# Patient Record
Sex: Male | Born: 1955
Health system: Southern US, Community
[De-identification: ages and names within clinical notes are randomized; demographics above are authoritative.]

## PROBLEM LIST (undated history)

## (undated) DIAGNOSIS — E785 Hyperlipidemia, unspecified: Secondary | ICD-10-CM

## (undated) DIAGNOSIS — I10 Essential (primary) hypertension: Secondary | ICD-10-CM

## (undated) DIAGNOSIS — R972 Elevated prostate specific antigen [PSA]: Secondary | ICD-10-CM

## (undated) DIAGNOSIS — G8110 Spastic hemiplegia affecting unspecified side: Secondary | ICD-10-CM

## (undated) DIAGNOSIS — E7800 Pure hypercholesterolemia, unspecified: Secondary | ICD-10-CM

## (undated) DIAGNOSIS — R7301 Impaired fasting glucose: Secondary | ICD-10-CM

## (undated) DIAGNOSIS — G8194 Hemiplegia, unspecified affecting left nondominant side: Secondary | ICD-10-CM

## (undated) DIAGNOSIS — I639 Cerebral infarction, unspecified: Secondary | ICD-10-CM

## (undated) HISTORY — DX: Pure hypercholesterolemia, unspecified: E78.00

## (undated) HISTORY — DX: Hyperlipidemia, unspecified: E78.5

## (undated) HISTORY — DX: Essential (primary) hypertension: I10

## (undated) HISTORY — DX: Hemiplegia, unspecified affecting left nondominant side: G81.94

## (undated) HISTORY — DX: Impaired fasting glucose: R73.01

## (undated) HISTORY — DX: Spastic hemiplegia affecting unspecified side: G81.10

## (undated) HISTORY — DX: Elevated prostate specific antigen (PSA): R97.20

## (undated) HISTORY — DX: Cerebral infarction, unspecified: I63.9

---

## 2003-01-04 HISTORY — PX: HERNIA REPAIR: SHX51

## 2004-01-04 HISTORY — PX: CATARACT EXTRACTION: SUR2

## 2006-04-18 ENCOUNTER — Emergency Department (HOSPITAL_COMMUNITY): Admission: EM | Admit: 2006-04-18 | Discharge: 2006-04-18 | Payer: Self-pay | Admitting: Emergency Medicine

## 2006-05-02 ENCOUNTER — Ambulatory Visit: Payer: Self-pay | Admitting: Cardiology

## 2006-05-17 ENCOUNTER — Ambulatory Visit: Payer: Self-pay | Admitting: Cardiology

## 2006-05-17 ENCOUNTER — Encounter: Payer: Self-pay | Admitting: Cardiovascular Disease

## 2006-05-17 ENCOUNTER — Ambulatory Visit: Payer: Self-pay

## 2006-05-17 LAB — CONVERTED CEMR LAB
CO2: 38 meq/L — ABNORMAL HIGH (ref 19–32)
GFR calc Af Amer: 132 mL/min
GFR calc non Af Amer: 109 mL/min

## 2006-05-26 ENCOUNTER — Ambulatory Visit: Payer: Self-pay | Admitting: Cardiology

## 2006-05-26 LAB — CONVERTED CEMR LAB
Calcium: 9.3 mg/dL (ref 8.4–10.5)
GFR calc Af Amer: 115 mL/min
GFR calc non Af Amer: 95 mL/min
Glucose, Bld: 108 mg/dL — ABNORMAL HIGH (ref 70–99)
Potassium: 3.2 meq/L — ABNORMAL LOW (ref 3.5–5.1)

## 2006-06-05 ENCOUNTER — Ambulatory Visit: Payer: Self-pay | Admitting: Cardiology

## 2006-09-06 ENCOUNTER — Ambulatory Visit: Payer: Self-pay | Admitting: Cardiology

## 2006-09-06 LAB — CONVERTED CEMR LAB
BUN: 13 mg/dL (ref 6–23)
Chloride: 101 meq/L (ref 96–112)
GFR calc Af Amer: 132 mL/min
Sodium: 144 meq/L (ref 135–145)

## 2006-09-11 ENCOUNTER — Ambulatory Visit: Payer: Self-pay | Admitting: Cardiology

## 2006-12-11 ENCOUNTER — Ambulatory Visit: Payer: Self-pay | Admitting: Cardiology

## 2006-12-11 LAB — CONVERTED CEMR LAB
CO2: 33 meq/L — ABNORMAL HIGH (ref 19–32)
Calcium: 9.1 mg/dL (ref 8.4–10.5)
Chloride: 98 meq/L (ref 96–112)
Creatinine, Ser: 0.9 mg/dL (ref 0.4–1.5)
GFR calc Af Amer: 115 mL/min
GFR calc non Af Amer: 95 mL/min
Potassium: 2.6 meq/L — CL (ref 3.5–5.1)
Sodium: 136 meq/L (ref 135–145)

## 2006-12-15 ENCOUNTER — Ambulatory Visit: Payer: Self-pay | Admitting: Cardiology

## 2006-12-15 LAB — CONVERTED CEMR LAB
CO2: 34 meq/L — ABNORMAL HIGH (ref 19–32)
Calcium: 9.3 mg/dL (ref 8.4–10.5)
Chloride: 101 meq/L (ref 96–112)
GFR calc Af Amer: 115 mL/min

## 2006-12-18 ENCOUNTER — Ambulatory Visit: Payer: Self-pay | Admitting: Cardiology

## 2006-12-18 LAB — CONVERTED CEMR LAB
CO2: 34 meq/L — ABNORMAL HIGH (ref 19–32)
CO2: 33 meq/L — ABNORMAL HIGH (ref 19–32)
Calcium: 9.5 mg/dL (ref 8.4–10.5)
Creatinine, Ser: 0.9 mg/dL (ref 0.4–1.5)
GFR calc Af Amer: 132 mL/min
GFR calc non Af Amer: 95 mL/min
Glucose, Bld: 114 mg/dL — ABNORMAL HIGH (ref 70–99)
Glucose, Bld: 128 mg/dL — ABNORMAL HIGH (ref 70–99)
Potassium: 2.9 meq/L — ABNORMAL LOW (ref 3.5–5.1)
Sodium: 141 meq/L (ref 135–145)
Sodium: 141 meq/L (ref 135–145)

## 2006-12-27 ENCOUNTER — Ambulatory Visit: Payer: Self-pay | Admitting: Cardiology

## 2006-12-27 LAB — CONVERTED CEMR LAB
BUN: 11 mg/dL (ref 6–23)
Calcium: 9.4 mg/dL (ref 8.4–10.5)
Chloride: 102 meq/L (ref 96–112)
Creatinine, Ser: 0.9 mg/dL (ref 0.4–1.5)
GFR calc non Af Amer: 95 mL/min

## 2008-08-02 DIAGNOSIS — I10 Essential (primary) hypertension: Secondary | ICD-10-CM | POA: Insufficient documentation

## 2009-01-03 DIAGNOSIS — I639 Cerebral infarction, unspecified: Secondary | ICD-10-CM

## 2009-01-03 HISTORY — DX: Cerebral infarction, unspecified: I63.9

## 2009-04-02 ENCOUNTER — Ambulatory Visit: Payer: Self-pay | Admitting: Pulmonary Disease

## 2009-04-02 ENCOUNTER — Inpatient Hospital Stay (HOSPITAL_COMMUNITY): Admission: EM | Admit: 2009-04-02 | Discharge: 2009-04-11 | Payer: Self-pay | Admitting: Emergency Medicine

## 2009-04-06 ENCOUNTER — Encounter (INDEPENDENT_AMBULATORY_CARE_PROVIDER_SITE_OTHER): Payer: Self-pay | Admitting: Neurology

## 2009-04-06 ENCOUNTER — Ambulatory Visit: Payer: Self-pay | Admitting: Physical Medicine & Rehabilitation

## 2009-04-07 ENCOUNTER — Encounter (INDEPENDENT_AMBULATORY_CARE_PROVIDER_SITE_OTHER): Payer: Self-pay | Admitting: Neurology

## 2009-04-11 ENCOUNTER — Inpatient Hospital Stay (HOSPITAL_COMMUNITY): Admission: EM | Admit: 2009-04-11 | Discharge: 2009-05-15 | Payer: Self-pay | Admitting: Neurology

## 2009-04-11 ENCOUNTER — Ambulatory Visit: Payer: Self-pay | Admitting: Physical Medicine & Rehabilitation

## 2009-05-03 ENCOUNTER — Ambulatory Visit: Payer: Self-pay | Admitting: Physical Medicine & Rehabilitation

## 2009-06-16 ENCOUNTER — Encounter
Admission: RE | Admit: 2009-06-16 | Discharge: 2009-09-14 | Payer: Self-pay | Admitting: Physical Medicine & Rehabilitation

## 2009-06-23 ENCOUNTER — Ambulatory Visit: Payer: Self-pay | Admitting: Physical Medicine & Rehabilitation

## 2009-06-30 ENCOUNTER — Encounter
Admission: RE | Admit: 2009-06-30 | Discharge: 2009-09-28 | Payer: Self-pay | Source: Home / Self Care | Admitting: Physical Medicine & Rehabilitation

## 2009-07-28 ENCOUNTER — Ambulatory Visit: Payer: Self-pay | Admitting: Physical Medicine & Rehabilitation

## 2009-08-26 ENCOUNTER — Ambulatory Visit: Payer: Self-pay | Admitting: Physical Medicine & Rehabilitation

## 2009-09-25 ENCOUNTER — Encounter
Admission: RE | Admit: 2009-09-25 | Discharge: 2009-10-02 | Payer: Self-pay | Source: Home / Self Care | Attending: Physical Medicine & Rehabilitation | Admitting: Physical Medicine & Rehabilitation

## 2009-09-29 ENCOUNTER — Encounter
Admission: RE | Admit: 2009-09-29 | Discharge: 2009-10-02 | Payer: Self-pay | Admitting: Physical Medicine & Rehabilitation

## 2009-10-02 ENCOUNTER — Ambulatory Visit: Payer: Self-pay | Admitting: Physical Medicine & Rehabilitation

## 2009-12-21 ENCOUNTER — Encounter
Admission: RE | Admit: 2009-12-21 | Discharge: 2009-12-29 | Payer: Self-pay | Source: Home / Self Care | Attending: Physical Medicine & Rehabilitation | Admitting: Physical Medicine & Rehabilitation

## 2009-12-29 ENCOUNTER — Ambulatory Visit: Payer: Self-pay | Admitting: Physical Medicine & Rehabilitation

## 2010-01-24 ENCOUNTER — Encounter: Payer: Self-pay | Admitting: Physical Medicine & Rehabilitation

## 2010-02-23 ENCOUNTER — Ambulatory Visit: Payer: Self-pay | Admitting: Physical Medicine & Rehabilitation

## 2010-02-26 ENCOUNTER — Ambulatory Visit (HOSPITAL_BASED_OUTPATIENT_CLINIC_OR_DEPARTMENT_OTHER): Payer: 59 | Admitting: Physical Medicine & Rehabilitation

## 2010-02-26 ENCOUNTER — Encounter: Payer: 59 | Attending: Physical Medicine & Rehabilitation

## 2010-02-26 DIAGNOSIS — G8110 Spastic hemiplegia affecting unspecified side: Secondary | ICD-10-CM

## 2010-03-09 ENCOUNTER — Ambulatory Visit: Payer: 59 | Admitting: Occupational Therapy

## 2010-03-09 ENCOUNTER — Ambulatory Visit: Payer: 59 | Attending: Physical Medicine & Rehabilitation | Admitting: Rehabilitative and Restorative Service Providers"

## 2010-03-09 DIAGNOSIS — R269 Unspecified abnormalities of gait and mobility: Secondary | ICD-10-CM | POA: Insufficient documentation

## 2010-03-09 DIAGNOSIS — M6281 Muscle weakness (generalized): Secondary | ICD-10-CM | POA: Insufficient documentation

## 2010-03-09 DIAGNOSIS — I69998 Other sequelae following unspecified cerebrovascular disease: Secondary | ICD-10-CM | POA: Insufficient documentation

## 2010-03-09 DIAGNOSIS — M2420 Disorder of ligament, unspecified site: Secondary | ICD-10-CM | POA: Insufficient documentation

## 2010-03-09 DIAGNOSIS — M25619 Stiffness of unspecified shoulder, not elsewhere classified: Secondary | ICD-10-CM | POA: Insufficient documentation

## 2010-03-09 DIAGNOSIS — M629 Disorder of muscle, unspecified: Secondary | ICD-10-CM | POA: Insufficient documentation

## 2010-03-09 DIAGNOSIS — Z5189 Encounter for other specified aftercare: Secondary | ICD-10-CM | POA: Insufficient documentation

## 2010-03-16 ENCOUNTER — Encounter: Payer: 59 | Admitting: Occupational Therapy

## 2010-03-16 ENCOUNTER — Ambulatory Visit: Payer: 59 | Admitting: Physical Therapy

## 2010-03-19 ENCOUNTER — Ambulatory Visit: Payer: 59 | Admitting: Physical Therapy

## 2010-03-23 LAB — BASIC METABOLIC PANEL
BUN: 10 mg/dL (ref 6–23)
BUN: 11 mg/dL (ref 6–23)
CO2: 25 mEq/L (ref 19–32)
CO2: 27 mEq/L (ref 19–32)
CO2: 32 mEq/L (ref 19–32)
Calcium: 8.4 mg/dL (ref 8.4–10.5)
Calcium: 8.7 mg/dL (ref 8.4–10.5)
Chloride: 107 mEq/L (ref 96–112)
Chloride: 104 mEq/L (ref 96–112)
Creatinine, Ser: 0.74 mg/dL (ref 0.4–1.5)
GFR calc Af Amer: >60 mL/min (ref >60)
GFR calc Af Amer: >60 mL/min (ref >60)
GFR calc Af Amer: >60 mL/min (ref >60)
GFR calc non Af Amer: >60 mL/min (ref >60)
GFR calc non Af Amer: >60 mL/min (ref >60)
Glucose, Bld: 110 mg/dL — ABNORMAL HIGH (ref 70–99)
Glucose, Bld: 135 mg/dL — ABNORMAL HIGH (ref 70–99)
Glucose, Bld: 148 mg/dL — ABNORMAL HIGH (ref 70–99)
Potassium: 4.1 mEq/L (ref 3.5–5.1)
Potassium: 2.7 mEq/L — CL (ref 3.5–5.1)
Potassium: 2.7 mEq/L — CL (ref 3.5–5.1)
Potassium: 3.2 mEq/L — ABNORMAL LOW (ref 3.5–5.1)
Potassium: 3.4 mEq/L — ABNORMAL LOW (ref 3.5–5.1)
Sodium: 138 mEq/L (ref 135–145)
Sodium: 138 mEq/L (ref 135–145)
Sodium: 138 mEq/L (ref 135–145)
Sodium: 142 mEq/L (ref 135–145)

## 2010-03-23 LAB — GLUCOSE, CAPILLARY
Glucose-Capillary: 120 mg/dL — ABNORMAL HIGH (ref 70–99)
Glucose-Capillary: 130 mg/dL — ABNORMAL HIGH (ref 70–99)
Glucose-Capillary: 134 mg/dL — ABNORMAL HIGH (ref 70–99)
Glucose-Capillary: 137 mg/dL — ABNORMAL HIGH (ref 70–99)
Glucose-Capillary: 139 mg/dL — ABNORMAL HIGH (ref 70–99)
Glucose-Capillary: 144 mg/dL — ABNORMAL HIGH (ref 70–99)
Glucose-Capillary: 152 mg/dL — ABNORMAL HIGH (ref 70–99)
Glucose-Capillary: 153 mg/dL — ABNORMAL HIGH (ref 70–99)
Glucose-Capillary: 158 mg/dL — ABNORMAL HIGH (ref 70–99)
Glucose-Capillary: 164 mg/dL — ABNORMAL HIGH (ref 70–99)
Glucose-Capillary: 164 mg/dL — ABNORMAL HIGH (ref 70–99)
Glucose-Capillary: 165 mg/dL — ABNORMAL HIGH (ref 70–99)
Glucose-Capillary: 166 mg/dL — ABNORMAL HIGH (ref 70–99)
Glucose-Capillary: 169 mg/dL — ABNORMAL HIGH (ref 70–99)
Glucose-Capillary: 170 mg/dL — ABNORMAL HIGH (ref 70–99)
Glucose-Capillary: 173 mg/dL — ABNORMAL HIGH (ref 70–99)
Glucose-Capillary: 185 mg/dL — ABNORMAL HIGH (ref 70–99)
Glucose-Capillary: 216 mg/dL — ABNORMAL HIGH (ref 70–99)

## 2010-03-23 LAB — URINE CULTURE

## 2010-03-23 LAB — URINALYSIS, ROUTINE W REFLEX MICROSCOPIC
Nitrite: NEGATIVE
Specific Gravity, Urine: 1.018 (ref 1.005–1.030)
Urobilinogen, UA: 0.2 mg/dL (ref 0.0–1.0)
pH: 6.5 (ref 5.0–8.0)

## 2010-03-23 LAB — CBC
Hemoglobin: 11.3 g/dL — ABNORMAL LOW (ref 13.0–17.0)
MCHC: 35.2 g/dL (ref 30.0–36.0)
MCV: 86 fL (ref 78.0–100.0)
RBC: 3.72 MIL/uL — ABNORMAL LOW (ref 4.22–5.81)
WBC: 4.5 10*3/uL (ref 4.0–10.5)

## 2010-03-24 ENCOUNTER — Ambulatory Visit: Payer: 59 | Admitting: Occupational Therapy

## 2010-03-24 ENCOUNTER — Ambulatory Visit: Payer: 59 | Admitting: Rehabilitative and Restorative Service Providers"

## 2010-03-24 LAB — COMPREHENSIVE METABOLIC PANEL
Alkaline Phosphatase: 53 U/L (ref 39–117)
BUN: 11 mg/dL (ref 6–23)
CO2: 28 mEq/L (ref 19–32)
CO2: 32 mEq/L (ref 19–32)
Calcium: 9.1 mg/dL (ref 8.4–10.5)
Chloride: 113 mEq/L — ABNORMAL HIGH (ref 96–112)
Creatinine, Ser: 0.86 mg/dL (ref 0.4–1.5)
GFR calc Af Amer: >60 mL/min (ref >60)
GFR calc non Af Amer: >60 mL/min (ref >60)
GFR calc non Af Amer: >60 mL/min (ref >60)
Glucose, Bld: 142 mg/dL — ABNORMAL HIGH (ref 70–99)
Glucose, Bld: 154 mg/dL — ABNORMAL HIGH (ref 70–99)
Potassium: 2.2 mEq/L — CL (ref 3.5–5.1)
Total Bilirubin: 0.8 mg/dL (ref 0.3–1.2)
Total Protein: 7.5 g/dL (ref 6.0–8.3)

## 2010-03-24 LAB — GLUCOSE, CAPILLARY
Glucose-Capillary: 110 mg/dL — ABNORMAL HIGH (ref 70–99)
Glucose-Capillary: 111 mg/dL — ABNORMAL HIGH (ref 70–99)
Glucose-Capillary: 121 mg/dL — ABNORMAL HIGH (ref 70–99)
Glucose-Capillary: 145 mg/dL — ABNORMAL HIGH (ref 70–99)
Glucose-Capillary: 150 mg/dL — ABNORMAL HIGH (ref 70–99)
Glucose-Capillary: 153 mg/dL — ABNORMAL HIGH (ref 70–99)
Glucose-Capillary: 161 mg/dL — ABNORMAL HIGH (ref 70–99)
Glucose-Capillary: 162 mg/dL — ABNORMAL HIGH (ref 70–99)
Glucose-Capillary: 163 mg/dL — ABNORMAL HIGH (ref 70–99)
Glucose-Capillary: 165 mg/dL — ABNORMAL HIGH (ref 70–99)
Glucose-Capillary: 166 mg/dL — ABNORMAL HIGH (ref 70–99)
Glucose-Capillary: 167 mg/dL — ABNORMAL HIGH (ref 70–99)
Glucose-Capillary: 170 mg/dL — ABNORMAL HIGH (ref 70–99)
Glucose-Capillary: 170 mg/dL — ABNORMAL HIGH (ref 70–99)
Glucose-Capillary: 171 mg/dL — ABNORMAL HIGH (ref 70–99)
Glucose-Capillary: 173 mg/dL — ABNORMAL HIGH (ref 70–99)
Glucose-Capillary: 176 mg/dL — ABNORMAL HIGH (ref 70–99)
Glucose-Capillary: 180 mg/dL — ABNORMAL HIGH (ref 70–99)
Glucose-Capillary: 180 mg/dL — ABNORMAL HIGH (ref 70–99)
Glucose-Capillary: 186 mg/dL — ABNORMAL HIGH (ref 70–99)
Glucose-Capillary: 189 mg/dL — ABNORMAL HIGH (ref 70–99)
Glucose-Capillary: 190 mg/dL — ABNORMAL HIGH (ref 70–99)
Glucose-Capillary: 194 mg/dL — ABNORMAL HIGH (ref 70–99)
Glucose-Capillary: 198 mg/dL — ABNORMAL HIGH (ref 70–99)
Glucose-Capillary: 204 mg/dL — ABNORMAL HIGH (ref 70–99)
Glucose-Capillary: 206 mg/dL — ABNORMAL HIGH (ref 70–99)
Glucose-Capillary: 209 mg/dL — ABNORMAL HIGH (ref 70–99)
Glucose-Capillary: 212 mg/dL — ABNORMAL HIGH (ref 70–99)
Glucose-Capillary: 214 mg/dL — ABNORMAL HIGH (ref 70–99)
Glucose-Capillary: 218 mg/dL — ABNORMAL HIGH (ref 70–99)
Glucose-Capillary: 222 mg/dL — ABNORMAL HIGH (ref 70–99)
Glucose-Capillary: 227 mg/dL — ABNORMAL HIGH (ref 70–99)
Glucose-Capillary: 228 mg/dL — ABNORMAL HIGH (ref 70–99)
Glucose-Capillary: 230 mg/dL — ABNORMAL HIGH (ref 70–99)
Glucose-Capillary: 231 mg/dL — ABNORMAL HIGH (ref 70–99)
Glucose-Capillary: 250 mg/dL — ABNORMAL HIGH (ref 70–99)

## 2010-03-24 LAB — DIFFERENTIAL
Basophils Absolute: 0 10*3/uL (ref 0.0–0.1)
Basophils Relative: 1 % (ref 0–1)
Lymphocytes Relative: 16 % (ref 12–46)
Lymphs Abs: 1.7 10*3/uL (ref 0.7–4.0)
Monocytes Absolute: 1 10*3/uL (ref 0.1–1.0)
Neutro Abs: 7.7 10*3/uL (ref 1.7–7.7)
Neutro Abs: 7.8 10*3/uL — ABNORMAL HIGH (ref 1.7–7.7)
Neutrophils Relative %: 72 % (ref 43–77)
Neutrophils Relative %: 76 % (ref 43–77)

## 2010-03-24 LAB — BLOOD GAS, ARTERIAL
Acid-Base Excess: 3 mmol/L — ABNORMAL HIGH (ref 0.0–2.0)
Acid-Base Excess: 3.2 mmol/L — ABNORMAL HIGH (ref 0.0–2.0)
Acid-Base Excess: 4.8 mmol/L — ABNORMAL HIGH (ref 0.0–2.0)
Bicarbonate: 27.6 mEq/L — ABNORMAL HIGH (ref 20.0–24.0)
Drawn by: 32752
FIO2: 0.3 %
MECHVT: 600 mL
O2 Saturation: 98.6 %
O2 Saturation: 99.4 %
PEEP: 5 cmH2O
Patient temperature: 98.6
RATE: 16 resp/min
TCO2: 28.6 mmol/L (ref 0–100)
pCO2 arterial: 38.2 mmHg (ref 35.0–45.0)
pO2, Arterial: 100 mmHg (ref 80.0–100.0)
pO2, Arterial: 105 mmHg — ABNORMAL HIGH (ref 80.0–100.0)

## 2010-03-24 LAB — BASIC METABOLIC PANEL
BUN: 14 mg/dL (ref 6–23)
BUN: 18 mg/dL (ref 6–23)
BUN: 21 mg/dL (ref 6–23)
BUN: 24 mg/dL — ABNORMAL HIGH (ref 6–23)
BUN: 25 mg/dL — ABNORMAL HIGH (ref 6–23)
BUN: 25 mg/dL — ABNORMAL HIGH (ref 6–23)
CO2: 26 mEq/L (ref 19–32)
CO2: 28 mEq/L (ref 19–32)
CO2: 30 mEq/L (ref 19–32)
CO2: 31 mEq/L (ref 19–32)
CO2: 35 mEq/L — ABNORMAL HIGH (ref 19–32)
Calcium: 8.6 mg/dL (ref 8.4–10.5)
Calcium: 8.6 mg/dL (ref 8.4–10.5)
Calcium: 8.9 mg/dL (ref 8.4–10.5)
Calcium: 9.1 mg/dL (ref 8.4–10.5)
Calcium: 9.1 mg/dL (ref 8.4–10.5)
Chloride: 114 mEq/L — ABNORMAL HIGH (ref 96–112)
Chloride: 115 mEq/L — ABNORMAL HIGH (ref 96–112)
Chloride: 125 mEq/L — ABNORMAL HIGH (ref 96–112)
Chloride: 126 mEq/L — ABNORMAL HIGH (ref 96–112)
Creatinine, Ser: 0.81 mg/dL (ref 0.4–1.5)
Creatinine, Ser: 0.85 mg/dL (ref 0.4–1.5)
Creatinine, Ser: 0.86 mg/dL (ref 0.4–1.5)
Creatinine, Ser: 0.87 mg/dL (ref 0.4–1.5)
Creatinine, Ser: 1.1 mg/dL (ref 0.4–1.5)
Creatinine, Ser: 1.16 mg/dL (ref 0.4–1.5)
GFR calc Af Amer: >60 mL/min (ref >60)
GFR calc Af Amer: >60 mL/min (ref >60)
GFR calc Af Amer: >60 mL/min (ref >60)
GFR calc non Af Amer: >60 mL/min (ref >60)
GFR calc non Af Amer: >60 mL/min (ref >60)
GFR calc non Af Amer: >60 mL/min (ref >60)
Glucose, Bld: 148 mg/dL — ABNORMAL HIGH (ref 70–99)
Glucose, Bld: 150 mg/dL — ABNORMAL HIGH (ref 70–99)
Glucose, Bld: 161 mg/dL — ABNORMAL HIGH (ref 70–99)
Glucose, Bld: 164 mg/dL — ABNORMAL HIGH (ref 70–99)
Glucose, Bld: 206 mg/dL — ABNORMAL HIGH (ref 70–99)
Potassium: 2.6 mEq/L — CL (ref 3.5–5.1)
Potassium: 2.7 mEq/L — CL (ref 3.5–5.1)
Potassium: 3 mEq/L — ABNORMAL LOW (ref 3.5–5.1)
Sodium: 143 mEq/L (ref 135–145)
Sodium: 149 mEq/L — ABNORMAL HIGH (ref 135–145)

## 2010-03-24 LAB — SODIUM
Sodium: 149 mEq/L — ABNORMAL HIGH (ref 135–145)
Sodium: 152 mEq/L — ABNORMAL HIGH (ref 135–145)
Sodium: 152 mEq/L — ABNORMAL HIGH (ref 135–145)
Sodium: 158 mEq/L — ABNORMAL HIGH (ref 135–145)
Sodium: 162 mEq/L (ref 135–145)
Sodium: 163 mEq/L (ref 135–145)
Sodium: 165 mEq/L (ref 135–145)
Sodium: 168 mEq/L (ref 135–145)

## 2010-03-24 LAB — LIPID PANEL
Cholesterol: 194 mg/dL (ref 0–200)
LDL Cholesterol: 127 mg/dL — ABNORMAL HIGH (ref 0–99)
VLDL: 23 mg/dL (ref 0–40)

## 2010-03-24 LAB — CBC
HCT: 37 % — ABNORMAL LOW (ref 39.0–52.0)
Hemoglobin: 11.9 g/dL — ABNORMAL LOW (ref 13.0–17.0)
Hemoglobin: 12.5 g/dL — ABNORMAL LOW (ref 13.0–17.0)
Hemoglobin: 13.6 g/dL (ref 13.0–17.0)
MCHC: 33.8 g/dL (ref 30.0–36.0)
MCHC: 34.3 g/dL (ref 30.0–36.0)
MCHC: 34.3 g/dL (ref 30.0–36.0)
MCV: 85.5 fL (ref 78.0–100.0)
MCV: 85.6 fL (ref 78.0–100.0)
MCV: 86.2 fL (ref 78.0–100.0)
Platelets: 212 10*3/uL (ref 150–400)
Platelets: 241 10*3/uL (ref 150–400)
RBC: 4.65 MIL/uL (ref 4.22–5.81)
RBC: 5.27 MIL/uL (ref 4.22–5.81)
RDW: 14 % (ref 11.5–15.5)
RDW: 14.3 % (ref 11.5–15.5)
WBC: 13.9 10*3/uL — ABNORMAL HIGH (ref 4.0–10.5)

## 2010-03-24 LAB — CULTURE, BLOOD (ROUTINE X 2)
Culture: NO GROWTH
Culture: NO GROWTH
Culture: NO GROWTH

## 2010-03-24 LAB — URINALYSIS, ROUTINE W REFLEX MICROSCOPIC
Glucose, UA: NEGATIVE mg/dL
Ketones, ur: NEGATIVE mg/dL
Ketones, ur: NEGATIVE mg/dL
Ketones, ur: NEGATIVE mg/dL
Leukocytes, UA: NEGATIVE
Leukocytes, UA: NEGATIVE
Leukocytes, UA: NEGATIVE
Nitrite: NEGATIVE
Nitrite: NEGATIVE
Protein, ur: 100 mg/dL — AB
Protein, ur: 100 mg/dL — AB
Protein, ur: NEGATIVE mg/dL
Urobilinogen, UA: 1 mg/dL (ref 0.0–1.0)
pH: 5.5 (ref 5.0–8.0)
pH: 6 (ref 5.0–8.0)

## 2010-03-24 LAB — URINALYSIS, MICROSCOPIC ONLY
Ketones, ur: NEGATIVE mg/dL
Leukocytes, UA: NEGATIVE
Nitrite: NEGATIVE
Specific Gravity, Urine: 1.026 (ref 1.005–1.030)
pH: 6 (ref 5.0–8.0)

## 2010-03-24 LAB — URINE CULTURE
Colony Count: NO GROWTH
Colony Count: NO GROWTH
Culture: NO GROWTH
Culture: NO GROWTH

## 2010-03-24 LAB — PHENYTOIN LEVEL, TOTAL
Phenytoin Lvl: 6.6 ug/mL — ABNORMAL LOW (ref 10.0–20.0)
Phenytoin Lvl: <2.5 ug/mL — ABNORMAL LOW (ref 10.0–20.0)

## 2010-03-24 LAB — URINE MICROSCOPIC-ADD ON

## 2010-03-24 LAB — HEMOGLOBIN A1C
Hgb A1c MFr Bld: 6.1 % (ref 4.6–6.1)
Mean Plasma Glucose: 128 mg/dL

## 2010-03-24 LAB — OSMOLALITY: Osmolality: 294 mOsm/kg (ref 275–300)

## 2010-03-26 ENCOUNTER — Ambulatory Visit: Payer: 59 | Admitting: Physical Therapy

## 2010-03-26 ENCOUNTER — Ambulatory Visit: Payer: 59 | Admitting: Occupational Therapy

## 2010-03-28 LAB — COMPREHENSIVE METABOLIC PANEL
Alkaline Phosphatase: 66 U/L (ref 39–117)
BUN: 8 mg/dL (ref 6–23)
CO2: 30 mEq/L (ref 19–32)
Chloride: 100 mEq/L (ref 96–112)
Creatinine, Ser: 0.92 mg/dL (ref 0.4–1.5)
GFR calc non Af Amer: >60 mL/min (ref >60)
Glucose, Bld: 140 mg/dL — ABNORMAL HIGH (ref 70–99)
Potassium: 2.3 mEq/L — CL (ref 3.5–5.1)
Total Bilirubin: 0.7 mg/dL (ref 0.3–1.2)

## 2010-03-28 LAB — BLOOD GAS, ARTERIAL
Acid-Base Excess: 2.2 mmol/L — ABNORMAL HIGH (ref 0.0–2.0)
FIO2: 0.4 %
MECHVT: 600 mL
PEEP: 5 cmH2O
RATE: 16 resp/min
pCO2 arterial: 45.9 mmHg — ABNORMAL HIGH (ref 35.0–45.0)

## 2010-03-28 LAB — DIFFERENTIAL
Basophils Absolute: 0.1 10*3/uL (ref 0.0–0.1)
Basophils Relative: 1 % (ref 0–1)
Lymphocytes Relative: 24 % (ref 12–46)
Monocytes Absolute: 0.9 10*3/uL (ref 0.1–1.0)
Neutro Abs: 8.3 10*3/uL — ABNORMAL HIGH (ref 1.7–7.7)
Neutrophils Relative %: 67 % (ref 43–77)

## 2010-03-28 LAB — CBC
HCT: 46.8 % (ref 39.0–52.0)
Hemoglobin: 14.1 g/dL (ref 13.0–17.0)
MCHC: 30.1 g/dL (ref 30.0–36.0)
Platelets: 255 10*3/uL (ref 150–400)
RDW: 13.5 % (ref 11.5–15.5)

## 2010-03-28 LAB — PROTIME-INR
INR: 1.04 (ref 0.00–1.49)
Prothrombin Time: 13.5 seconds (ref 11.6–15.2)

## 2010-03-28 LAB — TROPONIN I: Troponin I: <0.01 ng/mL (ref 0.00–0.06)

## 2010-03-28 LAB — POTASSIUM: Potassium: 2.8 mEq/L — ABNORMAL LOW (ref 3.5–5.1)

## 2010-03-28 LAB — CK TOTAL AND CKMB (NOT AT ARMC): Relative Index: 1 (ref 0.0–2.5)

## 2010-03-28 LAB — APTT: aPTT: 28 seconds (ref 24–37)

## 2010-03-30 ENCOUNTER — Encounter: Payer: 59 | Admitting: Occupational Therapy

## 2010-03-30 ENCOUNTER — Ambulatory Visit: Payer: 59 | Admitting: Physical Therapy

## 2010-03-31 ENCOUNTER — Ambulatory Visit: Payer: 59 | Admitting: Physical Therapy

## 2010-03-31 ENCOUNTER — Ambulatory Visit: Payer: 59 | Admitting: Occupational Therapy

## 2010-04-02 ENCOUNTER — Ambulatory Visit: Payer: 59 | Admitting: Rehabilitative and Restorative Service Providers"

## 2010-04-02 ENCOUNTER — Encounter: Payer: 59 | Admitting: Occupational Therapy

## 2010-04-06 ENCOUNTER — Ambulatory Visit: Payer: 59 | Admitting: Occupational Therapy

## 2010-04-06 ENCOUNTER — Ambulatory Visit: Payer: 59 | Attending: Physical Medicine & Rehabilitation | Admitting: Rehabilitative and Restorative Service Providers"

## 2010-04-06 DIAGNOSIS — Z5189 Encounter for other specified aftercare: Secondary | ICD-10-CM | POA: Insufficient documentation

## 2010-04-06 DIAGNOSIS — M25619 Stiffness of unspecified shoulder, not elsewhere classified: Secondary | ICD-10-CM | POA: Insufficient documentation

## 2010-04-06 DIAGNOSIS — M2420 Disorder of ligament, unspecified site: Secondary | ICD-10-CM | POA: Insufficient documentation

## 2010-04-06 DIAGNOSIS — I69998 Other sequelae following unspecified cerebrovascular disease: Secondary | ICD-10-CM | POA: Insufficient documentation

## 2010-04-06 DIAGNOSIS — M6281 Muscle weakness (generalized): Secondary | ICD-10-CM | POA: Insufficient documentation

## 2010-04-06 DIAGNOSIS — R269 Unspecified abnormalities of gait and mobility: Secondary | ICD-10-CM | POA: Insufficient documentation

## 2010-04-06 DIAGNOSIS — M629 Disorder of muscle, unspecified: Secondary | ICD-10-CM | POA: Insufficient documentation

## 2010-04-07 ENCOUNTER — Ambulatory Visit: Payer: 59 | Admitting: Rehabilitative and Restorative Service Providers"

## 2010-04-07 ENCOUNTER — Ambulatory Visit: Payer: 59 | Admitting: Occupational Therapy

## 2010-04-13 ENCOUNTER — Encounter: Payer: 59 | Admitting: Occupational Therapy

## 2010-04-15 ENCOUNTER — Encounter: Payer: 59 | Admitting: Occupational Therapy

## 2010-05-18 NOTE — Assessment & Plan Note (Signed)
[facility]                            CARDIOLOGY OFFICE NOTE   Steve Burton, Steve Burton                         MRN:          [id number]  DATE:06/05/2006                            DOB:          Aug 24, 1955    REASON FOR VISIT:  Followup hypertension.   HISTORY OF PRESENT ILLNESS:  I saw Mr. Murrillo back in late April. He has  a history of hypertension that is probably longstanding and supported by  left ventricular hypertrophy noted on both electrocardiography and  echocardiography done recently. He has an ejection fraction of 60% with  no wall motion abnormality, mildly calcified aortic valve and mild left  atrial enlargement. He had a small left ventricular outflow tract  gradient with basal septal hypertrophy. He has not had any problems with  exertional chest pain or shortness of breath. He has tolerated our  medication adjustments except that 10 mg of amlodipine made him feel  quite washed out. He cut this back to 5 mg daily along with his  hydrochlorothiazide and his blood pressure is still not yet controlled,  although better in general. He has had hypokalemia on followup blood  work despite potassium supplements at 20 mEq daily. Today, we talked  about trying to increase his Norvasc more gradually and also increasing  his potassium supplementation. His BUN and creatinine were normal.   ALLERGIES:  PENICILLIN.   MEDICATIONS:  1. Amlodipine 5 mg p.o. q a.m.  2. Potassium chloride 20 mEq p.o. daily.  3. Hydrochlorothiazide 25 mg p.o. daily.   REVIEW OF SYSTEMS:  As described in History of Present Illness.   PHYSICAL EXAMINATION:  Blood pressure today is 153/99, rechecked by me  at 150/96. Heart rate 84. Weight 208 pounds. The patient is comfortable  in no acute distress.  NECK:  Shows no elevated jugular pressure, without bruits. No  thyromegaly is noted.  LUNGS:  Are clear without labored breathing at rest.  CARDIAC: Reveals a regular rate  and rhythm without loud murmur or  gallop.  ABDOMEN: Without bruits. No tenderness. No hepatomegaly.  EXTREMITIES: Show no pitting edema.  SKIN: Is warm and dry.  MUSCULOSKELETAL: No kyphosis noted.  NEURO/PSYCHIATRIC: The patient is alert and oriented x3.   IMPRESSIONS AND RECOMMENDATIONS:  1. Hypertension, likely essential and associated with left ventricular      hypertrophy. Ejection fraction is normal. Will plan to increase      Norvasc to 7.5 mg daily in split dose and see if he tolerates this      better. Will also increase his potassium supplementation to 40 mEq      daily. He will have a followup BMET in two weeks and I will see him      back in one month. If he does not tolerate the increased dose of      Norvasc, I suspect we will need to change him to a      different medication such as possibly verapamil or an ACE      inhibitor.  2. Further plans to follow.     Illene Bolus  Diona Browner, MD  Electronically Signed    SGM/MedQ  DD: 06/05/2006  DT: 06/05/2006  Job #: [id number]

## 2010-05-18 NOTE — Assessment & Plan Note (Signed)
[facility]                            CARDIOLOGY OFFICE NOTE   MACKSON, BOTZ                         MRN:          [id number]  DATE:12/11/2006                            DOB:          1955/08/04    REASON FOR VISIT:  Hypertension.   HISTORY OF PRESENT ILLNESS:  Mr. Ingwersen comes in for a three month  visit.  He is not reporting any symptoms of chest pain or  breathlessness.  He recently got back from a two week trip from  [place], where he was visiting his wife's family.  He has been  compliant with his medications, except for the last few days, where he  has been out of hydrochlorothiazide.  His blood pressure is still  elevated, essentially in the range that it has been.   ALLERGIES:  PENICILLIN.   CURRENT MEDICATIONS:  1. Amlodipine 5 mg p.o. b.i.d.  2. Potassium chloride 20 mEq p.o. daily.  3. Hydrochlorothiazide 25 mg p.o. daily.   REVIEW OF SYSTEMS:  As described in the history of present illness.  He  did have some mild ankle edema transiently, but this has resolved.   PHYSICAL EXAMINATION:  Blood pressure is 162/99, heart rate 79, weight  216 pounds.  Patient is comfortable in no acute distress.  HEENT:  Conjunctivae and lids are normal.  Oropharynx is clear.  NECK:  Supple.  No elevated jugular venous, no loud bruits.  No  thyromegaly is noted.  LUNGS:  Generally clear without labored breathing.  CARDIAC:  Regular rate and rhythm.  No loud murmur or gallop.  ABDOMEN:  Soft and nontender.  Normoactive bowel sounds.  No bruits.  EXTREMITIES:  No significant pitting edema.  Skin is warm and dry.  MUSCULOSKELETAL:  No kyphosis is noted.  NEURO/PSYCHIATRIC:  Patient is alert and oriented x3.  Affect is normal.   IMPRESSION/RECOMMENDATIONS:  1. Essential hypertension:  I plan to change him to      Amlodipine/Benazepril 10/20 mg daily and continue the remainder of      his regimen, including hydrochlorothiazide and potassium  chloride.      I will then have him follow up with a BMET in two weeks and see me      back over the next three months.  We discussed diet and exercise      today as well.  2. Further plans to follow.     Jonelle Sidle, MD  Electronically Signed   SGM/MedQ  DD: 12/11/2006  DT: 12/11/2006  Job #: [id number]

## 2010-05-18 NOTE — Assessment & Plan Note (Signed)
[facility]                            CARDIOLOGY OFFICE NOTE   RANDY, CASTREJON                         MRN:          [id number]  DATE:09/11/2006                            DOB:          September 11, 1955    REASON FOR VISIT:  Followup hypertension.   HISTORY OF PRESENT ILLNESS:  Mr. Schaffert returns for a routine visit.  I  saw him back in June.  His blood pressure is generally better controlled  today, although certainly not optimal.  He did tolerate an increase,  ultimately, in Norvasc to 5 mg p.o. b.i.d.  He is not experiencing any  chest pain or dyspnea.  He does have occasional palpitations in the  evenings.  He admits that he has not been following a low-salt diet and  we talked about this some today.  He is also not exercising regularly at  this point.  Followup blood work after his last visit showed a potassium  of 3.4, BUN 13, creatinine 0.8.  His electrocardiogram today shows sinus  rhythm with nonspecific ST-T wave changes.  Today, we talked about being  more aggressive with lifestyle modification, and the next step of  potentially adding an additional medication if this is not successful.   ALLERGIES:  PENICILLIN.   PRESENT MEDICATIONS:  1. Amlodipine 5 mg p.o. b.i.d.  2. Potassium chloride 20 mEq p.o. daily.  3. Hydrochlorothiazide 25 mg p.o. daily.   REVIEW OF SYSTEMS:  As described in the history of present illness.   EXAMINATION:  Blood pressure 140/98, heart rate 79, weight 209 pounds,  which is stable.  The patient is comfortable in no acute distress.  Examination of the neck shows no elevated jugular venous pressure or  loud bruits.  LUNGS:  Clear without labored breathing.  CARDIAC:  Reveals a regular rate and rhythm with no loud murmur or  gallop.  EXTREMITIES:  No pitting edema.   IMPRESSION/RECOMMENDATIONS:  1. Essential hypertension.  I have discussed low-salt diet with Mr.      Ballo and also a goal of more regular  aerobic exercise with a goal      of weight loss.  I will plan to have him come back over the next      few months and we can follow up on his blood pressure control.  He      has stopped smoking and generally seems somewhat motivated to make      some other changes in his lifestyle as well.  If his blood pressure      is not better controlled, he will more than likely need a third      agent.  2. Further plan is to follow.    Jonelle Sidle, MD  Electronically Signed   SGM/MedQ  DD: 09/11/2006  DT: 09/12/2006  Job #: [id number]

## 2010-05-21 ENCOUNTER — Encounter: Payer: 59 | Attending: Physical Medicine & Rehabilitation | Admitting: Physical Medicine & Rehabilitation

## 2010-05-21 DIAGNOSIS — R569 Unspecified convulsions: Secondary | ICD-10-CM

## 2010-05-21 DIAGNOSIS — I10 Essential (primary) hypertension: Secondary | ICD-10-CM | POA: Insufficient documentation

## 2010-05-21 DIAGNOSIS — G8110 Spastic hemiplegia affecting unspecified side: Secondary | ICD-10-CM

## 2010-05-21 DIAGNOSIS — I619 Nontraumatic intracerebral hemorrhage, unspecified: Secondary | ICD-10-CM

## 2010-05-22 NOTE — Assessment & Plan Note (Signed)
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  Last visit, we injected his lumbricals as well as his flexor muscles in the forearm.  He tolerated these very well and it was felt that it did show some improvement.  He still frustrate that he does not have lot of active use of the left arm.  Therapy saw the patient, really felt that they were at a plateau with him.  The patient seemed to understand this as well.  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  REVIEW OF SYSTEMS:  Notable for the above.  Full 12-point review is in the written health and history section of chart.  SOCIAL HISTORY:  The patient is married, living with his wife.  PHYSICAL EXAMINATION:  VITAL SIGNS:  Blood pressure is 122/61, pulse 60, respiratory rate 18, he is satting 99% on room air. GENERAL:  The patient is pleasant, alert, and oriented x3.  Affect is generally bright and appropriate, although he is anxious at times.  The patient could become in fact negative occasionally as well. MUSCULOSKELETAL:  Left upper extremity has 1+ out of 4 tone at the wrist flexors and finger flexors.  Left hamstrings were 1+ to 2 out of 4. Biceps was trace out of 4.  Left upper extremity strength is generally trace at the wrist and hand, 2 and 5 at the left shoulder, 1+ to 2 out of 5 left biceps.  Left knee is 3 to 3+ out of 5, but limited by tightness in the hamstring musculature.  Left ankle is trace.  ASSESSMENT: 1. 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 2. Hypertension.  PLAN: 1. We will set Arlys John up for Botox injections to the left FDP, FDS     muscles as well as the flexor pollicis longus.  Also, we will     inject the semimembranosus tendinosis muscles as these appear most     tight today. 2. I talked to Raciel about being positive in respect to his recovery     and overall outlook.  I think  there is still opportunity for     improvement, but he needs to really work on a daily basis on range     of motion, gait, etc.  I think he and his wife can work on some of     his attentional issues as this is still a problem with his left-     sided use. 3. I will see him back here in about 2-4 weeks.     Ranelle Oyster, M.D. Electronically Signed    ZTS/MedQ D:  05/21/2010 11:35:23  T:  05/21/2010 23:54:02  Job #:  [id number]  cc:   Elvina Sidle, M.D. Fax: [phone]

## 2010-06-01 ENCOUNTER — Encounter: Payer: 59 | Attending: Physical Medicine & Rehabilitation | Admitting: Physical Medicine & Rehabilitation

## 2010-06-01 DIAGNOSIS — G8110 Spastic hemiplegia affecting unspecified side: Secondary | ICD-10-CM

## 2010-06-02 NOTE — Procedures (Signed)
NAMEORVEL, CUTSFORTH                ACCOUNT NO.:  [id number]  MEDICAL RECORD NO.:  [id number]           PATIENT TYPE:  O  LOCATION:  PRM                          FACILITY:  [facility]  PHYSICIAN:  Ranelle Oyster, M.D.DATE OF BIRTH:  1955-06-23  DATE OF PROCEDURE: DATE OF DISCHARGE:                              OPERATIVE REPORT  PROCEDURE:  Diagnostic code 342.12, spastic hemiparesis, nondominant limb.  DESCRIPTION OF PROCEDURE:  Botox injection.  Today, we injected 3 separate muscle groups including the left FDP, FDS, and flexor pollicis longus, as well as the left semimembranosus tendinosis complex.  I used a total of 400 units of Botox.  I injected approximately 75 units into the FPL, 225 units into the FDP and FDS, and 100 units into the semimembranosus and tendinosis muscles, and diluted each 100 unit of Botox into 1 mL preservative-free normal saline.  The patient tolerated well.  We discussed exercise today for taking advantage of this Botox injection, sure we have done this in the past as well.  I will see Anothy back in about 3 months to follow up his progress.  We did use EMG guidance to localize these muscles today.     Ranelle Oyster, M.D. Electronically Signed    ZTS/MEDQ  D:  06/01/2010 11:06:01  T:  06/02/2010 00:52:08  Job:  [id number]

## 2010-09-07 ENCOUNTER — Encounter: Payer: 59 | Attending: Physical Medicine & Rehabilitation | Admitting: Physical Medicine & Rehabilitation

## 2010-09-07 DIAGNOSIS — I619 Nontraumatic intracerebral hemorrhage, unspecified: Secondary | ICD-10-CM

## 2010-09-07 DIAGNOSIS — I10 Essential (primary) hypertension: Secondary | ICD-10-CM

## 2010-09-07 DIAGNOSIS — G8110 Spastic hemiplegia affecting unspecified side: Secondary | ICD-10-CM

## 2010-09-07 DIAGNOSIS — R209 Unspecified disturbances of skin sensation: Secondary | ICD-10-CM | POA: Insufficient documentation

## 2010-09-07 DIAGNOSIS — R569 Unspecified convulsions: Secondary | ICD-10-CM

## 2010-09-07 NOTE — Assessment & Plan Note (Signed)
HISTORY:  Steve Burton is back regarding his right basal ganglia hemorrhage and left spastic hemiparesis.  Generally he has been doing fairly well.  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  He had Botox injections in May and these were helpful.  He is working on stretching at home.  He has a nighttime and daytime splint that he wears typically.  He does some walking as well at home.  REVIEW OF SYSTEMS:  Notable for numbness on left side.  Full 12-point review is in the written health and history section of the chart.  SOCIAL HISTORY:  Unchanged.  PHYSICAL EXAMINATION:  VITAL SIGNS:  Blood pressure 99/55, pulse 65, respiratory rate 12, and satting 97% on room air. GENERAL:  The patient is pleasant and alert.  Affect seems to be bright and appropriate.  He is less anxious today. Told mild left inattention but improved.  Left upper extremity tone is trace to 1/4 in the shoulder biceps.  Hand and wrist flexors are 1+/4.  Her extremity tone is 1-1+/4. Underlying strength left upper is extremity is trace to 1/5 proximally trace to 1+/5 distally to 1+-2/5 proximally.  Left lower extremity is 4/5 hip flexors, 3+-4/5 knee extensors, 2/5 plantar flexion,  trace of five ankle dorsiflexion, and sensation is 1/2 left side.  ASSESSMENT: 1. Right basilar ganglia hemorrhage with spastic left hemiparesis. 2. Hypertension.  PLAN: 1. We will not pursue further Botox injections at this point as he is     maintained since the last set effects only will be temporary, and     in long run he has significant persistent weakness.  Continue with     regular stretching and exercise as he seems to be doing a good job     with. 2. Given his blood pressure low today.  I think we can try to wean off     some of his medications.  We dropped the losartan today.  Consider     dropping the amlodipine in about a month if pressure remains in the  110-120 systolic.  Stay with labetalol for now. 3. I will see him back in about 6 months.     Ranelle Oyster, M.D. Electronically Signed    ZTS/MedQ D:  09/07/2010 12:52:09  T:  09/07/2010 21:10:40  Job #:  [id number]  cc:   Elvina Sidle, M.D. Fax: [phone]

## 2010-09-18 IMAGING — CT CT HEAD W/O CM
1 of 2 series · 13 of 30 positions shown, 17 images · non-contrast
Comparison: 04/08/2009

CLINICAL DATA: Follow-up intracranial hemorrhage.

CT HEAD WITHOUT CONTRAST
TECHNIQUE: Contiguous axial images were obtained from the base of
the skull through the vertex without contrast.

[Series 2: brain · axial · 0.47mm/px · z∈[+124,+260]mm · 13 of 32 slices shown, 17 images]
[im 3/32  brain]
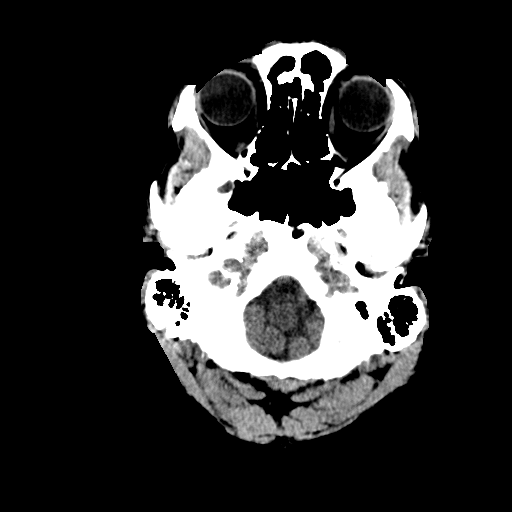
[im 3/32  bone]
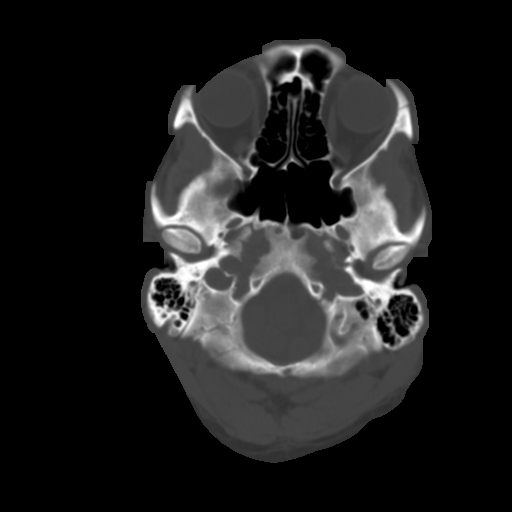
[im 5/32  brain]
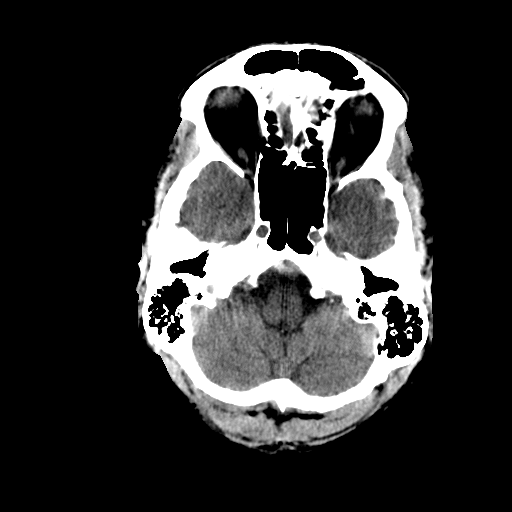
[im 7/32  brain]
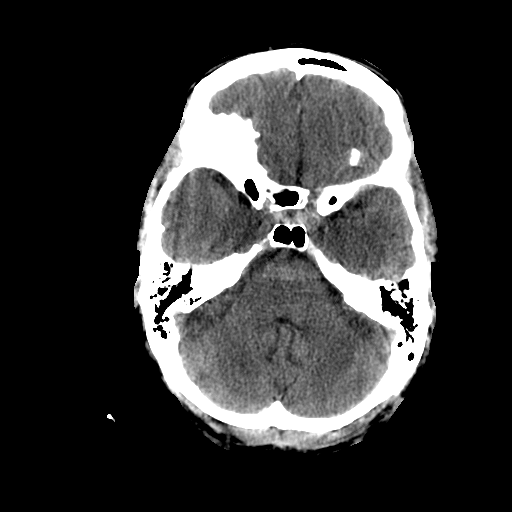
[im 9/32  brain]
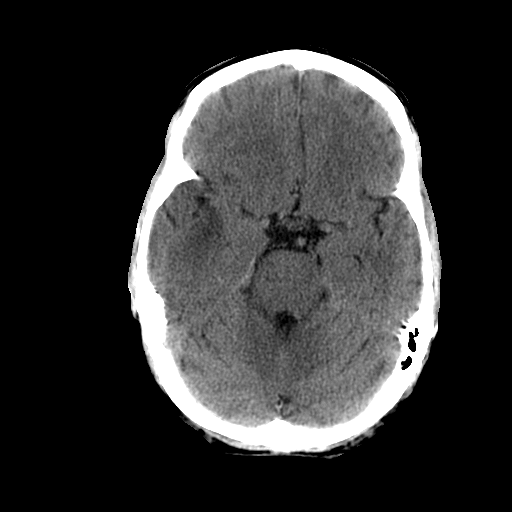
[im 12/32  brain]
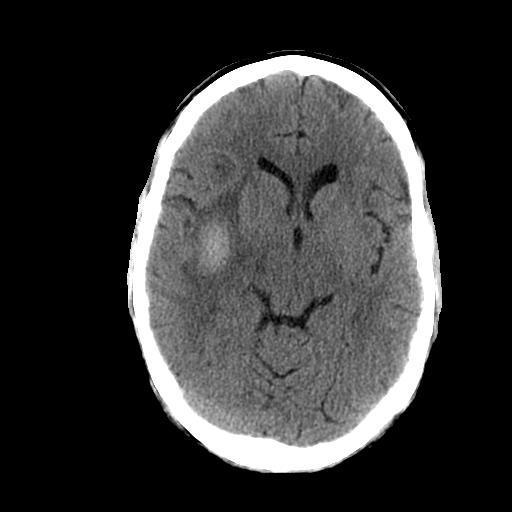
[im 12/32  bone]
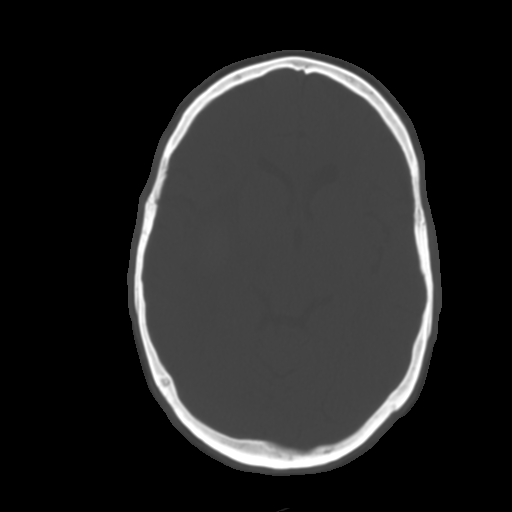
[im 14/32  brain]
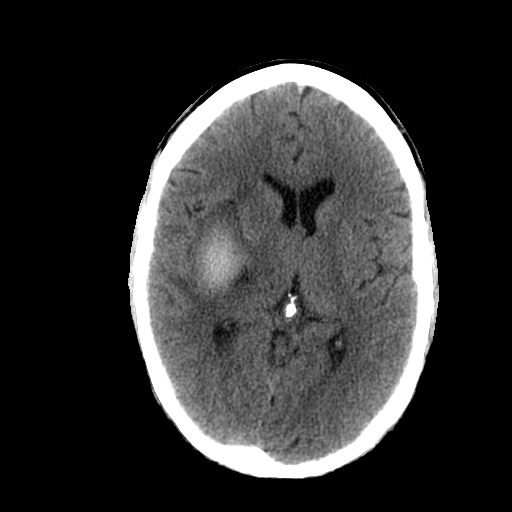
[im 16/32  brain]
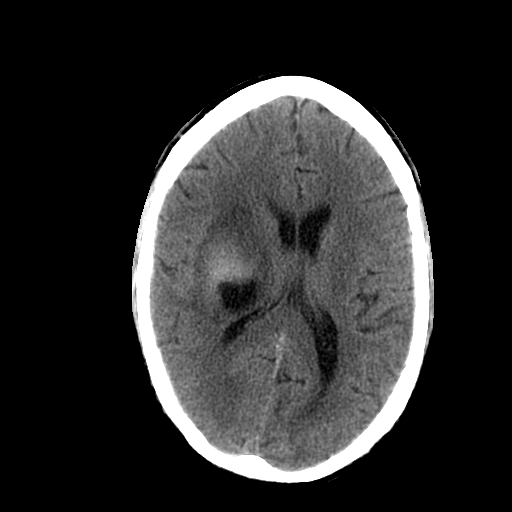
[im 18/32  brain]
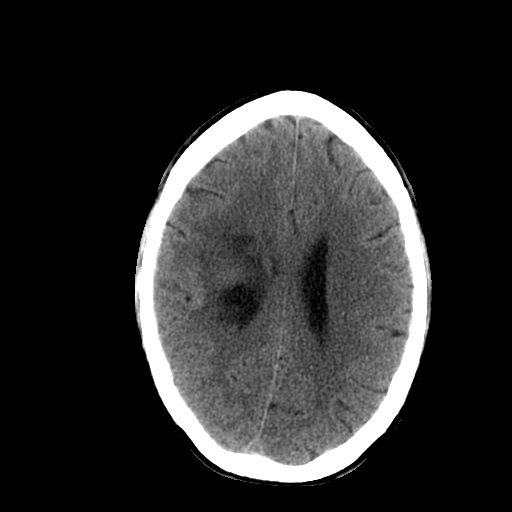
[im 20/32  brain]
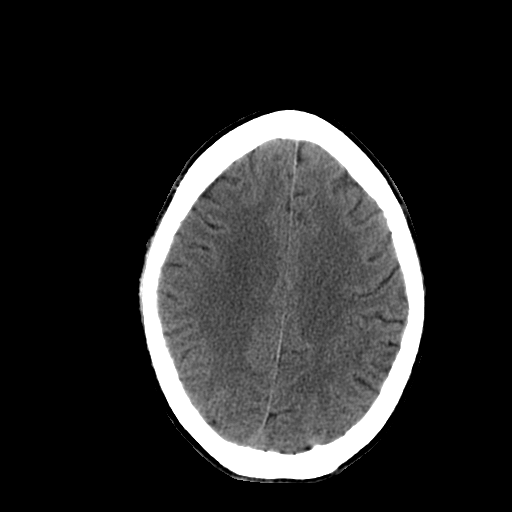
[im 20/32  bone]
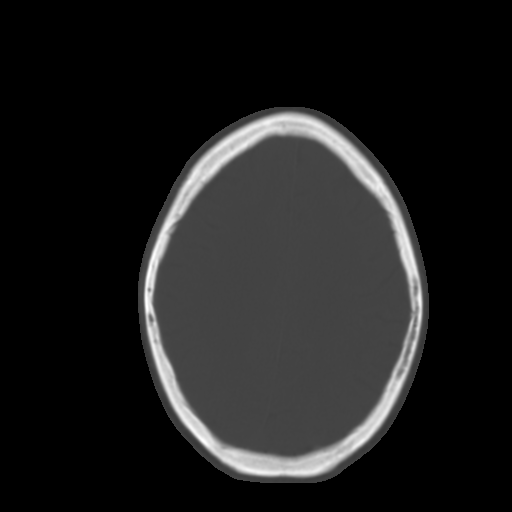
[im 23/32  brain]
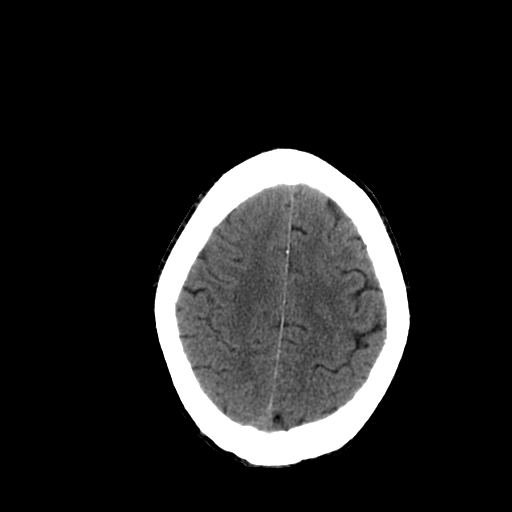
[im 25/32  brain]
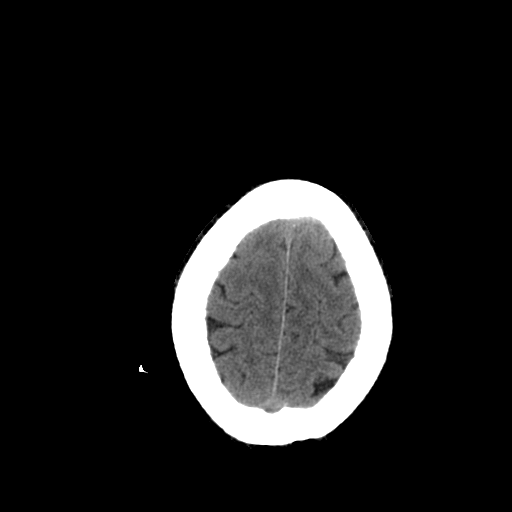
[im 27/32  brain]
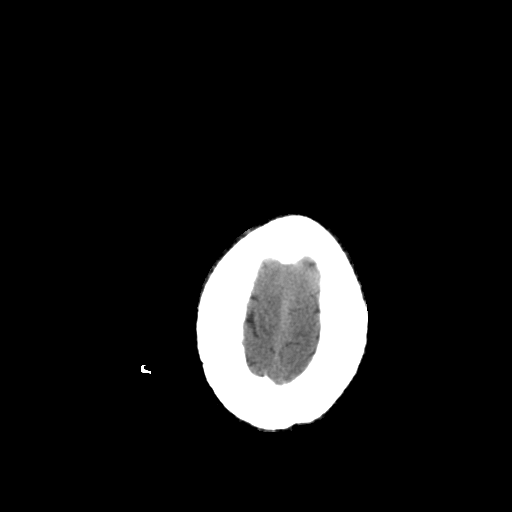
[im 29/32  brain]
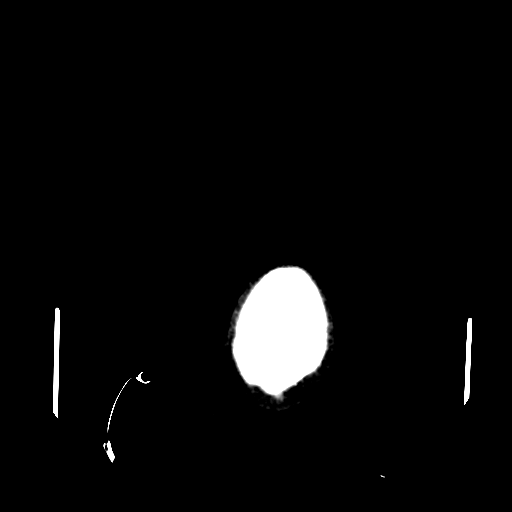
[im 29/32  bone]
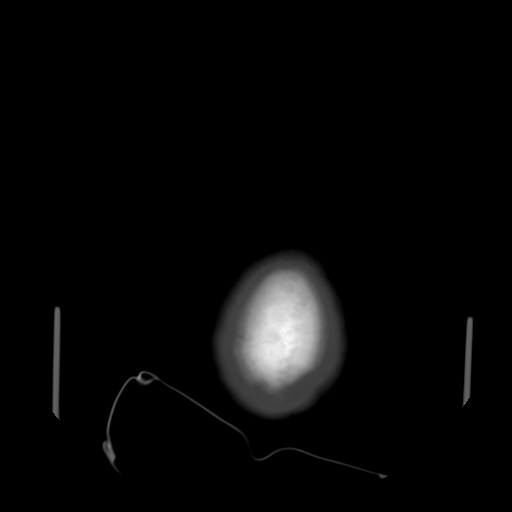

[13 of 30 positions shown; findings below may reference images not displayed]

FINDINGS: Intraparenchymal hematoma in the right basal ganglia
region is undergoing expected evolutionary changes, becoming less
dense and contracting.  Previously measured at 4 x 5.4 cm, the
abnormality appears to measure 4.5 x 2.5 cm.  There is an area of
infarction within the marginal white matter at the superior surface
of the hematoma.  There is less mass effect with right-to-left
shift now on the order of 4 mm, previously 10-11 mm.  No new
lesion.  The remainder the brain appears normal.  No extra-axial
collection.  No hydrocephalus.  The calvarium is unremarkable.
Sinuses, middle ears and mastoids are clear.
IMPRESSION: Expected evolutionary changes of an intraparenchymal hematoma in
the right basal ganglia with diminishing density and contraction.
Marginal white matter infarction along the superior surface of the
hematoma.  Less mass effect with diminishing right-to-left shift.

## 2011-02-14 ENCOUNTER — Ambulatory Visit: Payer: 59 | Admitting: Family Medicine

## 2011-02-14 ENCOUNTER — Encounter: Payer: Self-pay | Admitting: Family Medicine

## 2011-02-14 ENCOUNTER — Other Ambulatory Visit: Payer: Self-pay | Admitting: Family Medicine

## 2011-02-14 VITALS — BP 170/100 | HR 60 | Resp 16

## 2011-02-14 DIAGNOSIS — R972 Elevated prostate specific antigen [PSA]: Secondary | ICD-10-CM

## 2011-02-14 DIAGNOSIS — I10 Essential (primary) hypertension: Secondary | ICD-10-CM

## 2011-02-14 DIAGNOSIS — Z0000 Encounter for general adult medical examination without abnormal findings: Secondary | ICD-10-CM

## 2011-02-14 DIAGNOSIS — I639 Cerebral infarction, unspecified: Secondary | ICD-10-CM | POA: Insufficient documentation

## 2011-02-14 DIAGNOSIS — R3981 Functional urinary incontinence: Secondary | ICD-10-CM

## 2011-02-14 DIAGNOSIS — M62838 Other muscle spasm: Secondary | ICD-10-CM

## 2011-02-14 DIAGNOSIS — I69359 Hemiplegia and hemiparesis following cerebral infarction affecting unspecified side: Secondary | ICD-10-CM

## 2011-02-14 LAB — OTHER SOLSTAS TEST
ALT: 17 U/L (ref 0–53)
AST: 19 U/L (ref 0–37)
Albumin: 4.4 g/dL (ref 3.5–5.2)
Alkaline Phosphatase: 92 U/L (ref 39–117)
BUN: 11 mg/dL (ref 6–23)
CO2: 41 mEq/L — ABNORMAL HIGH (ref 19–32)
Calcium: 10.4 mg/dL (ref 8.4–10.5)
Chloride: 98 mEq/L (ref 96–112)
Cholesterol: 191 mg/dL (ref 0–200)
Creat: 0.79 mg/dL (ref 0.50–1.35)
Glucose, Bld: 106 mg/dL — ABNORMAL HIGH (ref 70–99)
HDL: 47 mg/dL (ref >39)
LDL Cholesterol: 122 mg/dL — ABNORMAL HIGH (ref 0–99)
Potassium: 3.3 mEq/L — ABNORMAL LOW (ref 3.5–5.3)
Sodium: 146 mEq/L — ABNORMAL HIGH (ref 135–145)
Total Bilirubin: 0.8 mg/dL (ref 0.3–1.2)
Total CHOL/HDL Ratio: 4.1 Ratio
Total Protein: 6.9 g/dL (ref 6.0–8.3)
Triglycerides: 112 mg/dL (ref <150)
VLDL: 22 mg/dL (ref 0–40)

## 2011-02-14 LAB — POCT UA - MICROSCOPIC ONLY
Bacteria, U Microscopic: NEGATIVE
Casts, Ur, LPF, POC: NEGATIVE
Crystals, Ur, HPF, POC: POSITIVE
Epithelial cells, urine per micros: NEGATIVE
Mucus, UA: POSITIVE
WBC, Ur, HPF, POC: NEGATIVE
Yeast, UA: NEGATIVE

## 2011-02-14 LAB — CBC WITH DIFFERENTIAL/PLATELET
Basophils Absolute: 0 10*3/uL (ref 0.0–0.1)
Basophils Relative: 0 % (ref 0–1)
Eosinophils Absolute: 0.3 10*3/uL (ref 0.0–0.7)
Eosinophils Relative: 4 % (ref 0–5)
HCT: 40.6 % (ref 39.0–52.0)
Hemoglobin: 13.6 g/dL (ref 13.0–17.0)
Lymphocytes Relative: 20 % (ref 12–46)
Lymphs Abs: 1.5 10*3/uL (ref 0.7–4.0)
MCH: 27.9 pg (ref 26.0–34.0)
MCHC: 33.5 g/dL (ref 30.0–36.0)
MCV: 83.2 fL (ref 78.0–100.0)
Monocytes Absolute: 0.6 10*3/uL (ref 0.1–1.0)
Monocytes Relative: 8 % (ref 3–12)
Neutro Abs: 5.1 10*3/uL (ref 1.7–7.7)
Neutrophils Relative %: 68 % (ref 43–77)
Platelets: 198 10*3/uL (ref 150–400)
RBC: 4.88 MIL/uL (ref 4.22–5.81)
RDW: 13.8 % (ref 11.5–15.5)
WBC: 7.5 10*3/uL (ref 4.0–10.5)

## 2011-02-14 LAB — POCT URINALYSIS DIPSTICK
Bilirubin, UA: NEGATIVE
Blood, UA: NEGATIVE
Glucose, UA: NEGATIVE
Ketones, UA: NEGATIVE
Leukocytes, UA: NEGATIVE
Nitrite, UA: NEGATIVE
Protein, UA: 30
Spec Grav, UA: 1.015
Urobilinogen, UA: 0.2
pH, UA: 7

## 2011-02-14 LAB — PSA: PSA: 7.11 ng/mL — ABNORMAL HIGH (ref <=4.00)

## 2011-02-14 LAB — TSH: TSH: 1.622 u[IU]/mL (ref 0.350–4.500)

## 2011-02-14 MED ORDER — BACLOFEN 10 MG PO TABS: 10.0000 mg | ORAL_TABLET | Freq: Three times a day (TID) | ORAL | Status: DC

## 2011-02-14 MED ORDER — LOSARTAN POTASSIUM 100 MG PO TABS: 100.0000 mg | ORAL_TABLET | Freq: Every day | ORAL | Status: DC

## 2011-02-14 MED ORDER — DILTIAZEM HCL ER COATED BEADS 180 MG PO CP24: 180.0000 mg | ORAL_CAPSULE | Freq: Every day | ORAL | Status: DC

## 2011-02-14 MED ORDER — LABETALOL HCL 200 MG PO TABS: 200.0000 mg | ORAL_TABLET | Freq: Two times a day (BID) | ORAL | Status: DC

## 2011-02-14 NOTE — Progress Notes (Signed)
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 He's had a dense left hemiparesis from stroke several years ago. He's also separate from depression. 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  Patient says that 2 weeks ago he experienced some incontinence, and for this reason he is discontinued his diuretic. He is having no new headaches, weakness, or dysuria. He says that the incontinence has gradually resolved but the last 2 weeks.  Patient is accompanied by his wife after being transported via scat vehicle here. She says that Helyn Numbers still continues to cry a lot and she is interested in having takes medication for this. Edvin denies that he has a problem, stating that his wife's worries are upsetting him.  Objective: No acute distress, patient seen with wife. He arrived in a wheelchair  HEENT: Unremarkable  Chest: Clear  Heart: Regular with 1/6 systolic ejection type murmur.  Abdomen: Soft nontender without HSM or masses.  Results for orders placed during the hospital encounter of 04/11/09  BASIC METABOLIC PANEL      Component Value Range   Sodium 138  135 - 145 (mEq/L)   Potassium 4.1  3.5 - 5.1 (mEq/L)   Chloride 107  96 - 112 (mEq/L)   CO2 25  19 - 32 (mEq/L)   Glucose, Bld 93  70 - 99 (mg/dL)   BUN 13  6 - 23 (mg/dL)   Creatinine, Ser 1.19  0.4 - 1.5 (mg/dL)   Calcium 9.0  8.4 - 14.7 (mg/dL)   GFR calc non Af Amer >60  >60 (mL/min)   GFR calc Af Amer    >60 (mL/min)   Value: >60            The eGFR has been calculated     using the MDRD equation.     This calculation has not been     validated in all clinical     situations.     eGFR's persistently     <60 mL/min signify     possible Chronic Kidney Disease.  CBC      Component Value Range   WBC 4.5  4.0 - 10.5 (K/uL)   RBC 3.72 (*) 4.22 - 5.81 (MIL/uL)   Hemoglobin 11.3 (*) 13.0 - 17.0 (g/dL)   HCT 82.9 (*) 56.2 - 52.0 (%)   MCV 86.0  78.0 - 100.0 (fL)   MCHC 35.2  30.0 - 36.0  (g/dL)   RDW 13.0  86.5 - 78.4 (%)   Platelets 124 (*) 150 - 400 (K/uL)  GLUCOSE, CAPILLARY      Component Value Range   Glucose-Capillary 185 (*) 70 - 99 (mg/dL)   Comment 1 Notify RN    GLUCOSE, CAPILLARY      Component Value Range   Glucose-Capillary 162 (*) 70 - 99 (mg/dL)   Comment 1 Notify RN    GLUCOSE, CAPILLARY      Component Value Range   Glucose-Capillary 134 (*) 70 - 99 (mg/dL)   Comment 1 Notify RN    GLUCOSE, CAPILLARY      Component Value Range   Glucose-Capillary 120 (*) 70 - 99 (mg/dL)   Comment 1 Notify RN    GLUCOSE, CAPILLARY      Component Value Range   Glucose-Capillary 170 (*) 70 - 99 (mg/dL)   Comment 1 Notify RN    GLUCOSE, CAPILLARY      Component Value Range   Glucose-Capillary 173 (*) 70 - 99 (mg/dL)  Comment 1 Notify RN    GLUCOSE, CAPILLARY      Component Value Range   Glucose-Capillary 165 (*) 70 - 99 (mg/dL)   Comment 1 Notify RN    GLUCOSE, CAPILLARY      Component Value Range   Glucose-Capillary 166 (*) 70 - 99 (mg/dL)  GLUCOSE, CAPILLARY      Component Value Range   Glucose-Capillary 145 (*) 70 - 99 (mg/dL)  GLUCOSE, CAPILLARY      Component Value Range   Glucose-Capillary 152 (*) 70 - 99 (mg/dL)  GLUCOSE, CAPILLARY      Component Value Range   Glucose-Capillary 164 (*) 70 - 99 (mg/dL)   Comment 1 Notify RN    GLUCOSE, CAPILLARY      Component Value Range   Glucose-Capillary 158 (*) 70 - 99 (mg/dL)   Comment 1 Notify RN    GLUCOSE, CAPILLARY      Component Value Range   Glucose-Capillary 169 (*) 70 - 99 (mg/dL)   Comment 1 Notify RN    GLUCOSE, CAPILLARY      Component Value Range   Glucose-Capillary 137 (*) 70 - 99 (mg/dL)  GLUCOSE, CAPILLARY      Component Value Range   Glucose-Capillary 216 (*) 70 - 99 (mg/dL)  GLUCOSE, CAPILLARY      Component Value Range   Glucose-Capillary 144 (*) 70 - 99 (mg/dL)  GLUCOSE, CAPILLARY      Component Value Range   Glucose-Capillary 139 (*) 70 - 99 (mg/dL)   Comment 1 Notify RN      GLUCOSE, CAPILLARY      Component Value Range   Glucose-Capillary 153 (*) 70 - 99 (mg/dL)   Comment 1 Notify RN    GLUCOSE, CAPILLARY      Component Value Range   Glucose-Capillary 146 (*) 70 - 99 (mg/dL)  BASIC METABOLIC PANEL      Component Value Range   Sodium 142  135 - 145 (mEq/L)   Potassium   (*) 3.5 - 5.1 (mEq/L)   Value: 2.7 CRITICAL RESULT CALLED TO, READ BACK BY AND VERIFIED WITH: J.WESTON,RN 04/20/09 0640 BY BSLADE   Chloride 102  96 - 112 (mEq/L)   CO2 32  19 - 32 (mEq/L)   Glucose, Bld 148 (*) 70 - 99 (mg/dL)   BUN 20  6 - 23 (mg/dL)   Creatinine, Ser 1.61  0.4 - 1.5 (mg/dL)   Calcium 9.0  8.4 - 09.6 (mg/dL)   GFR calc non Af Amer >60  >60 (mL/min)   GFR calc Af Amer    >60 (mL/min)   Value: >60            The eGFR has been calculated     using the MDRD equation.     This calculation has not been     validated in all clinical     situations.     eGFR's persistently     <60 mL/min signify     possible Chronic Kidney Disease.  GLUCOSE, CAPILLARY      Component Value Range   Glucose-Capillary 176 (*) 70 - 99 (mg/dL)   Comment 1 Notify RN    GLUCOSE, CAPILLARY      Component Value Range   Glucose-Capillary 164 (*) 70 - 99 (mg/dL)   Comment 1 Notify RN    GLUCOSE, CAPILLARY      Component Value Range   Glucose-Capillary 107 (*) 70 - 99 (mg/dL)   Comment 1 Notify RN  GLUCOSE, CAPILLARY      Component Value Range   Glucose-Capillary 130 (*) 70 - 99 (mg/dL)   Comment 1 Notify RN    BASIC METABOLIC PANEL      Component Value Range   Sodium 138  135 - 145 (mEq/L)   Potassium   (*) 3.5 - 5.1 (mEq/L)   Value: 2.7 CRITICAL RESULT CALLED TO, READ BACK BY AND VERIFIED WITH: Melissa Montane RN 04/22/09 0738 COSTELLO B   Chloride 101  96 - 112 (mEq/L)   CO2 27  19 - 32 (mEq/L)   Glucose, Bld 135 (*) 70 - 99 (mg/dL)   BUN 11  6 - 23 (mg/dL)   Creatinine, Ser 1.32  0.4 - 1.5 (mg/dL)   Calcium 8.4  8.4 - 44.0 (mg/dL)   GFR calc non Af Amer >60  >60 (mL/min)   GFR calc  Af Amer    >60 (mL/min)   Value: >60            The eGFR has been calculated     using the MDRD equation.     This calculation has not been     validated in all clinical     situations.     eGFR's persistently     <60 mL/min signify     possible Chronic Kidney Disease.  BASIC METABOLIC PANEL      Component Value Range   Sodium 139  135 - 145 (mEq/L)   Potassium 3.2 (*) 3.5 - 5.1 (mEq/L)   Chloride 104  96 - 112 (mEq/L)   CO2 28  19 - 32 (mEq/L)   Glucose, Bld 110 (*) 70 - 99 (mg/dL)   BUN 10  6 - 23 (mg/dL)   Creatinine, Ser 1.02  0.4 - 1.5 (mg/dL)   Calcium 8.5  8.4 - 72.5 (mg/dL)   GFR calc non Af Amer >60  >60 (mL/min)   GFR calc Af Amer    >60 (mL/min)   Value: >60            The eGFR has been calculated     using the MDRD equation.     This calculation has not been     validated in all clinical     situations.     eGFR's persistently     <60 mL/min signify     possible Chronic Kidney Disease.  URINE CULTURE      Component Value Range   Specimen Description URINE, CLEAN CATCH     Special Requests NONE     Colony Count 25,000 COLONIES/ML     Culture       Value: Multiple bacterial morphotypes present, none predominant. Suggest appropriate recollection if clinically indicated.   Report Status 04/25/2009 FINAL    URINALYSIS, ROUTINE W REFLEX MICROSCOPIC      Component Value Range   Color, Urine YELLOW  YELLOW    APPearance CLEAR  CLEAR    Specific Gravity, Urine 1.018  1.005 - 1.030    pH 6.5  5.0 - 8.0    Glucose, UA NEGATIVE  NEGATIVE (mg/dL)   Hgb urine dipstick NEGATIVE  NEGATIVE    Bilirubin Urine NEGATIVE  NEGATIVE    Ketones, ur NEGATIVE  NEGATIVE (mg/dL)   Protein, ur NEGATIVE  NEGATIVE (mg/dL)   Urobilinogen, UA 0.2  0.0 - 1.0 (mg/dL)   Nitrite NEGATIVE  NEGATIVE    Leukocytes, UA    NEGATIVE    Value: NEGATIVE MICROSCOPIC NOT DONE ON URINES WITH NEGATIVE  PROTEIN, BLOOD, LEUKOCYTES, NITRITE, OR GLUCOSE <1000 mg/dL.  BASIC METABOLIC PANEL      Component  Value Range   Sodium 138  135 - 145 (mEq/L)   Potassium 3.4 (*) 3.5 - 5.1 (mEq/L)   Chloride 105  96 - 112 (mEq/L)   CO2 26  19 - 32 (mEq/L)   Glucose, Bld 115 (*) 70 - 99 (mg/dL)   BUN 7  6 - 23 (mg/dL)   Creatinine, Ser 1.61  0.4 - 1.5 (mg/dL)   Calcium 8.7  8.4 - 09.6 (mg/dL)   GFR calc non Af Amer >60  >60 (mL/min)   GFR calc Af Amer    >60 (mL/min)   Value: >60            The eGFR has been calculated     using the MDRD equation.     This calculation has not been     validated in all clinical     situations.     eGFR's persistently     <60 mL/min signify     possible Chronic Kidney Disease.  GLUCOSE, CAPILLARY      Component Value Range   Glucose-Capillary 159 (*) 70 - 99 (mg/dL)   Comment 1 Notify RN    GLUCOSE, CAPILLARY      Component Value Range   Glucose-Capillary 212 (*) 70 - 99 (mg/dL)  GLUCOSE, CAPILLARY      Component Value Range   Glucose-Capillary 223 (*) 70 - 99 (mg/dL)  GLUCOSE, CAPILLARY      Component Value Range   Glucose-Capillary 170 (*) 70 - 99 (mg/dL)  GLUCOSE, CAPILLARY      Component Value Range   Glucose-Capillary 213 (*) 70 - 99 (mg/dL)   Comment 1 Notify RN    GLUCOSE, CAPILLARY      Component Value Range   Glucose-Capillary 250 (*) 70 - 99 (mg/dL)   Comment 1 Notify RN    GLUCOSE, CAPILLARY      Component Value Range   Glucose-Capillary 144 (*) 70 - 99 (mg/dL)   Comment 1 Notify RN    GLUCOSE, CAPILLARY      Component Value Range   Glucose-Capillary 186 (*) 70 - 99 (mg/dL)   Comment 1 Notify RN    GLUCOSE, CAPILLARY      Component Value Range   Glucose-Capillary 235 (*) 70 - 99 (mg/dL)   Comment 1 Notify RN    GLUCOSE, CAPILLARY      Component Value Range   Glucose-Capillary 169 (*) 70 - 99 (mg/dL)   Comment 1 Notify RN    CBC      Component Value Range   WBC 10.8 (*) 4.0 - 10.5 (K/uL)   RBC 4.65  4.22 - 5.81 (MIL/uL)   Hemoglobin 13.6  13.0 - 17.0 (g/dL)   HCT 04.5  40.9 - 81.1 (%)   MCV 85.6  78.0 - 100.0 (fL)   MCHC 34.3   30.0 - 36.0 (g/dL)   RDW 91.4  78.2 - 95.6 (%)   Platelets 121 (*) 150 - 400 (K/uL)  COMPREHENSIVE METABOLIC PANEL      Component Value Range   Sodium 153 (*) 135 - 145 (mEq/L)   Potassium   (*) 3.5 - 5.1 (mEq/L)   Value: 2.4 REPEATED TO VERIFY CRITICAL RESULT CALLED TO, READ BACK BY AND VERIFIED WITH: M.BARBOUR,RN 2130 04/13/09 CLARK,S   Chloride 115 (*) 96 - 112 (mEq/L)   CO2 32  19 - 32 (mEq/L)   Glucose,  Bld 142 (*) 70 - 99 (mg/dL)   BUN 25 (*) 6 - 23 (mg/dL)   Creatinine, Ser 1.61  0.4 - 1.5 (mg/dL)   Calcium 9.1  8.4 - 09.6 (mg/dL)   Total Protein 6.8  6.0 - 8.3 (g/dL)   Albumin 3.1 (*) 3.5 - 5.2 (g/dL)   AST 96 (*) 0 - 37 (U/L)   ALT 138 (*) 0 - 53 (U/L)   Alkaline Phosphatase 66  39 - 117 (U/L)   Total Bilirubin 0.5  0.3 - 1.2 (mg/dL)   GFR calc non Af Amer >60  >60 (mL/min)   GFR calc Af Amer    >60 (mL/min)   Value: >60            The eGFR has been calculated     using the MDRD equation.     This calculation has not been     validated in all clinical     situations.     eGFR's persistently     <60 mL/min signify     possible Chronic Kidney Disease.  DIFFERENTIAL      Component Value Range   Neutrophils Relative 72  43 - 77 (%)   Neutro Abs 7.8 (*) 1.7 - 7.7 (K/uL)   Lymphocytes Relative 16  12 - 46 (%)   Lymphs Abs 1.7  0.7 - 4.0 (K/uL)   Monocytes Relative 7  3 - 12 (%)   Monocytes Absolute 0.8  0.1 - 1.0 (K/uL)   Eosinophils Relative 4  0 - 5 (%)   Eosinophils Absolute 0.5  0.0 - 0.7 (K/uL)   Basophils Relative 0  0 - 1 (%)   Basophils Absolute 0.0  0.0 - 0.1 (K/uL)  PHENYTOIN LEVEL, TOTAL      Component Value Range   Phenytoin Lvl <2.5 (*) 10.0 - 20.0 (ug/mL)  GLUCOSE, CAPILLARY      Component Value Range   Glucose-Capillary 190 (*) 70 - 99 (mg/dL)   Comment 1 Documented in Chart     Comment 2 Notify RN    GLUCOSE, CAPILLARY      Component Value Range   Glucose-Capillary 206 (*) 70 - 99 (mg/dL)   Comment 1 Notify RN    GLUCOSE, CAPILLARY       Component Value Range   Glucose-Capillary 167 (*) 70 - 99 (mg/dL)   Comment 1 Notify RN    GLUCOSE, CAPILLARY      Component Value Range   Glucose-Capillary 158 (*) 70 - 99 (mg/dL)  GLUCOSE, CAPILLARY      Component Value Range   Glucose-Capillary 239 (*) 70 - 99 (mg/dL)  GLUCOSE, CAPILLARY      Component Value Range   Glucose-Capillary 148 (*) 70 - 99 (mg/dL)   Comment 1 Notify RN    URINALYSIS, MICROSCOPIC ONLY      Component Value Range   Color, Urine YELLOW  YELLOW    APPearance CLEAR  CLEAR    Specific Gravity, Urine 1.026  1.005 - 1.030    pH 6.0  5.0 - 8.0    Glucose, UA NEGATIVE  NEGATIVE (mg/dL)   Hgb urine dipstick NEGATIVE  NEGATIVE    Bilirubin Urine NEGATIVE  NEGATIVE    Ketones, ur NEGATIVE  NEGATIVE (mg/dL)   Protein, ur 045 (*) NEGATIVE (mg/dL)   Urobilinogen, UA 1.0  0.0 - 1.0 (mg/dL)   Nitrite NEGATIVE  NEGATIVE    Leukocytes, UA NEGATIVE  NEGATIVE    WBC, UA 0-2  <3 (WBC/hpf)  RBC / HPF 0-2  <3 (RBC/hpf)   Bacteria, UA RARE  RARE    Squamous Epithelial / LPF RARE  RARE    Casts HYALINE CASTS (*) NEGATIVE    Urine-Other MUCOUS PRESENT    URINE CULTURE      Component Value Range   Specimen Description URINE, CLEAN CATCH     Special Requests NONE     Colony Count NO GROWTH     Culture NO GROWTH     Report Status 04/15/2009 FINAL    GLUCOSE, CAPILLARY      Component Value Range   Glucose-Capillary 183 (*) 70 - 99 (mg/dL)   Comment 1 Notify RN    BASIC METABOLIC PANEL      Component Value Range   Sodium 153 (*) 135 - 145 (mEq/L)   Potassium   (*) 3.5 - 5.1 (mEq/L)   Value: 2.7 CRITICAL RESULT CALLED TO, READ BACK BY AND VERIFIED WITH: M.VONCANNON,RN 04/14/09 0832 BY BSLADE   Chloride 115 (*) 96 - 112 (mEq/L)   CO2 31  19 - 32 (mEq/L)   Glucose, Bld 206 (*) 70 - 99 (mg/dL)   BUN 25 (*) 6 - 23 (mg/dL)   Creatinine, Ser 4.09  0.4 - 1.5 (mg/dL)   Calcium 9.1  8.4 - 81.1 (mg/dL)   GFR calc non Af Amer >60  >60 (mL/min)   GFR calc Af Amer    >60  (mL/min)   Value: >60            The eGFR has been calculated     using the MDRD equation.     This calculation has not been     validated in all clinical     situations.     eGFR's persistently     <60 mL/min signify     possible Chronic Kidney Disease.  GLUCOSE, CAPILLARY      Component Value Range   Glucose-Capillary 235 (*) 70 - 99 (mg/dL)   Comment 1 Notify RN    GLUCOSE, CAPILLARY      Component Value Range   Glucose-Capillary 177 (*) 70 - 99 (mg/dL)   Comment 1 Notify RN    GLUCOSE, CAPILLARY      Component Value Range   Glucose-Capillary 171 (*) 70 - 99 (mg/dL)  GLUCOSE, CAPILLARY      Component Value Range   Glucose-Capillary 163 (*) 70 - 99 (mg/dL)   Comment 1 Notify RN    GLUCOSE, CAPILLARY      Component Value Range   Glucose-Capillary 173 (*) 70 - 99 (mg/dL)   Comment 1 Notify RN    BASIC METABOLIC PANEL      Component Value Range   Sodium 153 (*) 135 - 145 (mEq/L)   Potassium 3.0 (*) 3.5 - 5.1 (mEq/L)   Chloride 115 (*) 96 - 112 (mEq/L)   CO2 30  19 - 32 (mEq/L)   Glucose, Bld 164 (*) 70 - 99 (mg/dL)   BUN 25 (*) 6 - 23 (mg/dL)   Creatinine, Ser 9.14  0.4 - 1.5 (mg/dL)   Calcium 8.9  8.4 - 78.2 (mg/dL)   GFR calc non Af Amer >60  >60 (mL/min)   GFR calc Af Amer    >60 (mL/min)   Value: >60            The eGFR has been calculated     using the MDRD equation.     This calculation has not been     validated  in all clinical     situations.     eGFR's persistently     <60 mL/min signify     possible Chronic Kidney Disease.  GLUCOSE, CAPILLARY      Component Value Range   Glucose-Capillary 154 (*) 70 - 99 (mg/dL)   Comment 1 Notify RN    GLUCOSE, CAPILLARY      Component Value Range   Glucose-Capillary 187 (*) 70 - 99 (mg/dL)   Comment 1 Notify RN    GLUCOSE, CAPILLARY      Component Value Range   Glucose-Capillary 121 (*) 70 - 99 (mg/dL)   Comment 1 Notify RN    GLUCOSE, CAPILLARY      Component Value Range   Glucose-Capillary 176 (*) 70 - 99  (mg/dL)   Comment 1 Notify RN    GLUCOSE, CAPILLARY      Component Value Range   Glucose-Capillary 222 (*) 70 - 99 (mg/dL)   Comment 1 Notify RN    GLUCOSE, CAPILLARY      Component Value Range   Glucose-Capillary 195 (*) 70 - 99 (mg/dL)   Comment 1 Notify RN    GLUCOSE, CAPILLARY      Component Value Range   Glucose-Capillary 159 (*) 70 - 99 (mg/dL)   Comment 1 Notify RN    BASIC METABOLIC PANEL      Component Value Range   Sodium 149 (*) 135 - 145 (mEq/L)   Potassium 3.0 (*) 3.5 - 5.1 (mEq/L)   Chloride 114 (*) 96 - 112 (mEq/L)   CO2 27  19 - 32 (mEq/L)   Glucose, Bld 150 (*) 70 - 99 (mg/dL)   BUN 21  6 - 23 (mg/dL)   Creatinine, Ser 1.61  0.4 - 1.5 (mg/dL)   Calcium 8.8  8.4 - 09.6 (mg/dL)   GFR calc non Af Amer >60  >60 (mL/min)   GFR calc Af Amer    >60 (mL/min)   Value: >60            The eGFR has been calculated     using the MDRD equation.     This calculation has not been     validated in all clinical     situations.     eGFR's persistently     <60 mL/min signify     possible Chronic Kidney Disease.  PHENYTOIN LEVEL, TOTAL      Component Value Range   Phenytoin Lvl 6.6 (*) 10.0 - 20.0 (ug/mL)  GLUCOSE, CAPILLARY      Component Value Range   Glucose-Capillary 211 (*) 70 - 99 (mg/dL)   Comment 1 Notify RN    GLUCOSE, CAPILLARY      Component Value Range   Glucose-Capillary 111 (*) 70 - 99 (mg/dL)   Comment 1 Notify RN    GLUCOSE, CAPILLARY      Component Value Range   Glucose-Capillary 148 (*) 70 - 99 (mg/dL)  GLUCOSE, CAPILLARY      Component Value Range   Glucose-Capillary 209 (*) 70 - 99 (mg/dL)   Comment 1 Notify RN     Results for orders placed in visit on 02/14/11  POCT UA - MICROSCOPIC ONLY      Component Value Range   WBC, Ur, HPF, POC negative     RBC, urine, microscopic 0-1     Bacteria, U Microscopic negative     Mucus, UA positive     Epithelial cells, urine per micros negative     Crystals,  Ur, HPF, POC positive     Casts, Ur, LPF,  POC negative     Yeast, UA negative    POCT URINALYSIS DIPSTICK      Component Value Range   Color, UA yellow     Clarity, UA clear     Glucose, UA negative     Bilirubin, UA negative     Ketones, UA negative     Spec Grav, UA 1.015     Blood, UA negative     pH, UA 7.0     Protein, UA 30     Urobilinogen, UA 0.2     Nitrite, UA negative     Leukocytes, UA Negative     Blood pressure: 170/100  Assessment: 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  Recent incontinence.  Plan:  Adjust meds for BP, add diltiazem. rechk 3 mos

## 2011-02-15 LAB — URINE CULTURE
Colony Count: NO GROWTH
Organism ID, Bacteria: NO GROWTH

## 2011-02-15 NOTE — Progress Notes (Signed)
Addended by: Elvina Sidle on: 02/15/2011 05:25 PM   Modules accepted: Orders

## 2011-02-22 ENCOUNTER — Encounter: Payer: Self-pay | Admitting: Family Medicine

## 2011-03-07 ENCOUNTER — Encounter: Payer: Self-pay | Admitting: Physical Medicine & Rehabilitation

## 2011-03-07 ENCOUNTER — Encounter: Payer: 59 | Attending: Physical Medicine & Rehabilitation | Admitting: Physical Medicine & Rehabilitation

## 2011-03-07 DIAGNOSIS — G8194 Hemiplegia, unspecified affecting left nondominant side: Secondary | ICD-10-CM | POA: Insufficient documentation

## 2011-03-07 DIAGNOSIS — G8114 Spastic hemiplegia affecting left nondominant side: Secondary | ICD-10-CM

## 2011-03-07 DIAGNOSIS — I619 Nontraumatic intracerebral hemorrhage, unspecified: Secondary | ICD-10-CM | POA: Insufficient documentation

## 2011-03-07 DIAGNOSIS — I6350 Cerebral infarction due to unspecified occlusion or stenosis of unspecified cerebral artery: Secondary | ICD-10-CM

## 2011-03-07 DIAGNOSIS — I639 Cerebral infarction, unspecified: Secondary | ICD-10-CM

## 2011-03-07 DIAGNOSIS — I10 Essential (primary) hypertension: Secondary | ICD-10-CM

## 2011-03-07 DIAGNOSIS — G8110 Spastic hemiplegia affecting unspecified side: Secondary | ICD-10-CM | POA: Insufficient documentation

## 2011-03-07 NOTE — Patient Instructions (Signed)
Continue range of motion exercises, splinting as you are.  If you want to increase the baclofen, let me know.

## 2011-03-07 NOTE — Progress Notes (Signed)
Subjective:    Patient ID: Steve Burton, male    DOB: 01-Apr-1955, 56 y.o.   MRN: [id number]  HPI  Steve Burton is back regarding his basal ganglia hemorrhage and spastic left hemiparesis. He doesn't report a lot of changes. His spasticity is unchanged in the LUE.  He is interested in further therapy.  I asked him what he would like to work on he stated that he would like to walk more efficiently. He uses his cane to walk short dx around the house and uses a wheelchair outside the home.  He feels that he's generally fairly stable if he can see where he places his foot. He remains with his wife who is supportive.  He continues work 20 hours a week for [employer].    Pain Inventory Average Pain 0 Pain Right Now 0 My pain is intermittent  In the last 24 hours, has pain interfered with the following? General activity 0 Relation with others 0 Enjoyment of life 0 What TIME of day is your pain at its worst? morning Sleep (in general) Good  Pain is worse with: walking Pain improves with: rest Relief from Meds: 1  Mobility use a wheelchair transfers alone  Function disabled: date disabled 04/02/2009  Neuro/Psych weakness  Prior Studies Any changes since last visit?  no  Physicians involved in your care Any changes since last visit?  no Primary care Steve Burton      Review of Systems  Neurological: Positive for weakness.  Psychiatric/Behavioral: Positive for dysphoric mood.  All other systems reviewed and are negative.       Objective:   Physical Exam  Constitutional: He is oriented to person, place, and time. He appears well-developed and well-nourished.  HENT:  Head: Normocephalic and atraumatic.  Eyes: EOM are normal. Pupils are equal, round, and reactive to light.  Neck: Normal range of motion.  Cardiovascular: Normal rate.   Pulmonary/Chest: Effort normal.  Abdominal: Soft.  Neurological: He is alert and oriented to person, place, and time. A cranial nerve deficit and  sensory deficit is present.  Reflex Scores:      Tricep reflexes are 2+ on the right side and 3+ on the left side.      Bicep reflexes are 2+ on the right side and 3+ on the left side.      Brachioradialis reflexes are 2+ on the right side and 3+ on the left side.      Patellar reflexes are 2+ on the right side and 3+ on the left side.      Achilles reflexes are 2+ on the right side and 3+ on the left side.      Pt is cognitively intact.  His vision still is a little poor. He has difficulties with objects to the left.  He has 1-2/5 strength in the left upper extremity flexors.  Tone is 3 in the WF and FF's.  Sensory exam is 1/2.  LLE is 3 to 4/5 in the HF and KE, 0/5 in ADF and APF.  Sensory exam in the left leg is 0/2.  He ambulates with significant steppage in his gait.  He has to look down at the foot for placement.  Skin: Skin is warm.  Psychiatric: His behavior is normal. Judgment and thought content normal. Cognition and memory are normal. He exhibits a depressed mood.          Assessment & Plan:  1. 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  -The patient has made  some gains in his gait, and i think he would benefit from another trial of therapy.   -I think aquatic PT would be particularly helpful to address, balance, strength, form, etc.  -I made a referral to hand and rehab.  -Discussed with the patient the option of increasing his baclofen. He stated he wanted to hold off for now  -I reviewed with him the fact that his deficits are permament but that he still can make functional progress.  -His wife remains very supportive of him which helps.  2. Mood-  -the patient denies depression, but still has difficulties coping with his deficits  -tried to provide positive reinforcement and encouraged him to look at the glass as "half full"  3. I'll see him back in 6 months.  Questions were encouraged and answered.

## 2011-07-10 ENCOUNTER — Other Ambulatory Visit: Payer: Self-pay | Admitting: Family Medicine

## 2011-08-06 ENCOUNTER — Other Ambulatory Visit: Payer: Self-pay | Admitting: Physician Assistant

## 2011-08-11 ENCOUNTER — Other Ambulatory Visit: Payer: Self-pay | Admitting: Family Medicine

## 2011-08-11 ENCOUNTER — Other Ambulatory Visit: Payer: Self-pay | Admitting: Physician Assistant

## 2011-08-11 DIAGNOSIS — I639 Cerebral infarction, unspecified: Secondary | ICD-10-CM

## 2011-08-11 MED ORDER — BACLOFEN 10 MG PO TABS: 10.0000 mg | ORAL_TABLET | Freq: Three times a day (TID) | ORAL | Status: DC

## 2011-08-11 NOTE — Telephone Encounter (Signed)
Ok for 30 day rx, has appt early September

## 2011-09-07 ENCOUNTER — Encounter: Payer: 59 | Admitting: Physical Medicine & Rehabilitation

## 2011-10-27 ENCOUNTER — Other Ambulatory Visit: Payer: Self-pay | Admitting: Family Medicine

## 2011-10-27 DIAGNOSIS — I10 Essential (primary) hypertension: Secondary | ICD-10-CM

## 2011-10-27 DIAGNOSIS — I639 Cerebral infarction, unspecified: Secondary | ICD-10-CM

## 2011-10-27 MED ORDER — BACLOFEN 10 MG PO TABS: 10.0000 mg | ORAL_TABLET | Freq: Three times a day (TID) | ORAL | Status: DC

## 2011-10-27 MED ORDER — LABETALOL HCL 200 MG PO TABS: 200.0000 mg | ORAL_TABLET | Freq: Two times a day (BID) | ORAL | Status: DC

## 2011-10-27 MED ORDER — LOSARTAN POTASSIUM 100 MG PO TABS: 100.0000 mg | ORAL_TABLET | Freq: Every day | ORAL | Status: DC

## 2011-10-27 MED ORDER — DILTIAZEM HCL ER COATED BEADS 180 MG PO CP24: 180.0000 mg | ORAL_CAPSULE | Freq: Every day | ORAL | Status: DC

## 2011-10-27 MED ORDER — AMLODIPINE BESYLATE 10 MG PO TABS: 10.0000 mg | ORAL_TABLET | Freq: Every day | ORAL | Status: DC

## 2011-12-09 ENCOUNTER — Encounter: Payer: Self-pay | Admitting: Family Medicine

## 2011-12-09 ENCOUNTER — Ambulatory Visit (INDEPENDENT_AMBULATORY_CARE_PROVIDER_SITE_OTHER): Payer: Medicare Other | Admitting: Family Medicine

## 2011-12-09 VITALS — BP 155/81 | HR 68 | Temp 98.4°F | Resp 16

## 2011-12-09 DIAGNOSIS — H6090 Unspecified otitis externa, unspecified ear: Secondary | ICD-10-CM

## 2011-12-09 DIAGNOSIS — H60399 Other infective otitis externa, unspecified ear: Secondary | ICD-10-CM

## 2011-12-09 MED ORDER — CIPROFLOXACIN HCL 500 MG PO TABS: 500.0000 mg | ORAL_TABLET | Freq: Two times a day (BID) | ORAL | Status: DC

## 2011-12-09 MED ORDER — NEOMYCIN-POLYMYXIN-HC 3.5-10000-1 OT SOLN: 3.0000 [drp] | Freq: Four times a day (QID) | OTIC | Status: DC

## 2011-12-09 NOTE — Progress Notes (Signed)
Is a 56 year old man who suffered a stroke with left hemiplegia and  sensory deficit. He recently returned from [place] (Wednesday) and noticed that his hearing was impaired on the left. He has numbness on this left side of his face so we really didn't feel much pain but the hearing loss is become very profound of the last 24 hours. He attempted to urinate the year thinking that it was wax but a bloody discharge resulted and he came today for further evaluation.  Objective: Left ear canal is swollen with scattered exudates and serosanguineous drainage. The eardrum is thickened with exudates on it as well. He has mild left-sided cervical lymphadenopathy in anterior cervical chain.  Assessment: Otitis externa following trip to [place] where it was warm and moist.  Plan: 1. Otitis externa  neomycin-polymyxin-hydrocortisone (CORTISPORIN) otic solution, ciprofloxacin (CIPRO) 500 MG tablet

## 2011-12-09 NOTE — Patient Instructions (Signed)
Otitis Externa Otitis externa is a bacterial or fungal infection of the outer ear canal. This is the area from the eardrum to the outside of the ear. Otitis externa is sometimes called "swimmer's ear." CAUSES  Possible causes of infection include:  Swimming in dirty water.  Moisture remaining in the ear after swimming or bathing.  Mild injury (trauma) to the ear.  Objects stuck in the ear (foreign body).  Cuts or scrapes (abrasions) on the outside of the ear. SYMPTOMS  The first symptom of infection is often itching in the ear canal. Later signs and symptoms may include swelling and redness of the ear canal, ear pain, and yellowish-white fluid (pus) coming from the ear. The ear pain may be worse when pulling on the earlobe. DIAGNOSIS  Your caregiver will perform a physical exam. A sample of fluid may be taken from the ear and examined for bacteria or fungi. TREATMENT  Antibiotic ear drops are often given for 10 to 14 days. Treatment may also include pain medicine or corticosteroids to reduce itching and swelling. PREVENTION   Keep your ear dry. Use the corner of a towel to absorb water out of the ear canal after swimming or bathing.  Avoid scratching or putting objects inside your ear. This can damage the ear canal or remove the protective wax that lines the canal. This makes it easier for bacteria and fungi to grow.  Avoid swimming in lakes, polluted water, or poorly chlorinated pools.  You may use ear drops made of rubbing alcohol and vinegar after swimming. Combine equal parts of white vinegar and alcohol in a bottle. Put 3 or 4 drops into each ear after swimming. HOME CARE INSTRUCTIONS   Apply antibiotic ear drops to the ear canal as prescribed by your caregiver.  Only take over-the-counter or prescription medicines for pain, discomfort, or fever as directed by your caregiver.  If you have diabetes, follow any additional treatment instructions from your caregiver.  Keep all  follow-up appointments as directed by your caregiver. SEEK MEDICAL CARE IF:   You have a fever.  Your ear is still red, swollen, painful, or draining pus after 3 days.  Your redness, swelling, or pain gets worse.  You have a severe headache.  You have redness, swelling, pain, or tenderness in the area behind your ear. MAKE SURE YOU:   Understand these instructions.  Will watch your condition.  Will get help right away if you are not doing well or get worse. Document Released: 12/20/2004 Document Revised: 03/14/2011 Document Reviewed: 01/06/2011 ExitCare Patient Information 2013 [employer].  

## 2011-12-13 ENCOUNTER — Ambulatory Visit (INDEPENDENT_AMBULATORY_CARE_PROVIDER_SITE_OTHER): Payer: Medicare Other | Admitting: Family Medicine

## 2011-12-13 ENCOUNTER — Encounter: Payer: Self-pay | Admitting: Family Medicine

## 2011-12-13 VITALS — BP 120/80 | HR 81 | Temp 98.8°F | Resp 18

## 2011-12-13 DIAGNOSIS — H60399 Other infective otitis externa, unspecified ear: Secondary | ICD-10-CM

## 2011-12-13 DIAGNOSIS — L259 Unspecified contact dermatitis, unspecified cause: Secondary | ICD-10-CM

## 2011-12-13 DIAGNOSIS — H6090 Unspecified otitis externa, unspecified ear: Secondary | ICD-10-CM

## 2011-12-13 DIAGNOSIS — H6120 Impacted cerumen, unspecified ear: Secondary | ICD-10-CM

## 2011-12-13 DIAGNOSIS — L309 Dermatitis, unspecified: Secondary | ICD-10-CM

## 2011-12-13 MED ORDER — TRIAMCINOLONE ACETONIDE 0.1 % EX CREA: TOPICAL_CREAM | Freq: Two times a day (BID) | CUTANEOUS | Status: DC

## 2011-12-13 NOTE — Progress Notes (Signed)
56 yo hemiparetic man with left otic drainage and loss of hearing.  He was started on Cipro and cortisporin a few days ago but the hearing has gotten worse.  He has no pain on the left side of his face.  He also has recurrent right antecubital scaling and erythema with itching ever since his stroke.  He has not yet started his antidepressant, worried that it might interfere with his antibiotics.  Objective:  Debris throughout left canal. Irrigated by the assistant and myself over 20 minutes. At conclusion of urination, the tympanic membrane was visible and appeared to be normal. The anterior aspect of the ear canal adjacent to the tympanic membrane appeared to have a small ulceration. There were no other abnormalities in the ear canal and the patient was hearing better when he left. Also has eczematous rash right antecubital fossa.  Assessment: Otitis externa which should respond to the Cortisporin and Cipro over the next few days. If not Avastin to let me know so I can refer him to ear nose and throat.  I also wrote for a new wheelchair because his wheelchair is definitely broken  I also wrote for triamcinolone cream for his right antecubital eczema  And finally patient will start his fluoxetine soon after he finishes his Cipro

## 2011-12-18 ENCOUNTER — Other Ambulatory Visit: Payer: Self-pay | Admitting: Family Medicine

## 2011-12-18 DIAGNOSIS — G8190 Hemiplegia, unspecified affecting unspecified side: Secondary | ICD-10-CM

## 2011-12-19 ENCOUNTER — Other Ambulatory Visit: Payer: Self-pay | Admitting: Family Medicine

## 2011-12-19 DIAGNOSIS — H9192 Unspecified hearing loss, left ear: Secondary | ICD-10-CM

## 2012-04-03 DIAGNOSIS — Z0271 Encounter for disability determination: Secondary | ICD-10-CM

## 2012-05-03 ENCOUNTER — Telehealth: Payer: Self-pay

## 2012-05-03 DIAGNOSIS — I639 Cerebral infarction, unspecified: Secondary | ICD-10-CM

## 2012-05-03 NOTE — Telephone Encounter (Signed)
Steve Burton had a severe stroke a couple years ago. His wheelchair has worn out. Can either of you call to arrange a new wheelchair? He prefers a light weight chair, available perhaps from [employer]. Thanks, Kenyon Ana  Above copied from staff message.  Printed off order for wheelchair and will fax to [facility].

## 2012-05-07 ENCOUNTER — Telehealth: Payer: Self-pay | Admitting: Radiology

## 2012-05-07 NOTE — Telephone Encounter (Signed)
[facility] called for pts ht/wt. Advised patient in wheel chair for office visits. No height and weight obtained.

## 2012-05-11 ENCOUNTER — Telehealth: Payer: Self-pay

## 2012-05-11 NOTE — Telephone Encounter (Signed)
Betsy from [facility] came in to [facility] and reported that d/t Medicare ins, pt will need to have a face-to-face OV to eval/document need for wheelchair. She will bring in information about how to fill out these forms in [employer] so that we can let the providers know. She stated [facility] will let the pt know that he needs an OV before wheelchair will be covered.

## 2012-05-30 ENCOUNTER — Encounter: Payer: Self-pay | Admitting: Family Medicine

## 2012-05-30 ENCOUNTER — Ambulatory Visit (INDEPENDENT_AMBULATORY_CARE_PROVIDER_SITE_OTHER): Payer: Medicare Other | Admitting: Family Medicine

## 2012-05-30 VITALS — BP 160/94 | HR 59 | Temp 98.4°F | Resp 16 | Ht 72.0 in | Wt 174.6 lb

## 2012-05-30 DIAGNOSIS — I10 Essential (primary) hypertension: Secondary | ICD-10-CM

## 2012-05-30 DIAGNOSIS — E119 Type 2 diabetes mellitus without complications: Secondary | ICD-10-CM

## 2012-05-30 DIAGNOSIS — I639 Cerebral infarction, unspecified: Secondary | ICD-10-CM

## 2012-05-30 DIAGNOSIS — H5501 Congenital nystagmus: Secondary | ICD-10-CM

## 2012-05-30 DIAGNOSIS — E785 Hyperlipidemia, unspecified: Secondary | ICD-10-CM

## 2012-05-30 DIAGNOSIS — I6789 Other cerebrovascular disease: Secondary | ICD-10-CM

## 2012-05-30 MED ORDER — DILTIAZEM HCL ER COATED BEADS 240 MG PO CP24: 240.0000 mg | ORAL_CAPSULE | Freq: Every day | ORAL | Status: DC

## 2012-05-30 NOTE — Progress Notes (Signed)
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 He suffered a CVA in March of 2011 and has been paralyzed on his left side since. He requires a wheelchair to get around, a left resting arm splint, and continuation of his disability.  The wheelchair has started to fall apart. He needs a lightweight wheelchair with a lumbar support. He's going into this purchase on [employer].  He needs a prescription for resting arm splint.  No chest pain, shortness of breath, trouble voiding, headaches  Patient does have problems with constipation periodically but he is managing this as he has the last 3 years.  Objective:  No acute distress. Left dense hemiplegia persists. Patient is able to get out of a chair and pivot but really not walk Chest is clear Heart: Regular no murmur Blood pressure recheck 156/86 with regular pulse Extremities: No edema  Assessment: Stable at this point. This is been a very difficult stroke to cope with. Wife is having in and making the best of things. She recently came back from [place] where she traveled by herself.  Plan: Patient's to let me know if there is trouble obtaining a wheelchair, I have increased the blood pressure medicine (diltiazem) to 240 mg once a day, I reinforced splint for the left arm, and I filled out disability forms.  Signed, Elvina Sidle

## 2012-07-27 ENCOUNTER — Other Ambulatory Visit: Payer: Self-pay | Admitting: Family Medicine

## 2012-07-27 DIAGNOSIS — I10 Essential (primary) hypertension: Secondary | ICD-10-CM

## 2012-07-27 MED ORDER — DILTIAZEM HCL ER COATED BEADS 240 MG PO CP24: 240.0000 mg | ORAL_CAPSULE | Freq: Every day | ORAL | Status: DC

## 2012-08-04 ENCOUNTER — Other Ambulatory Visit: Payer: Self-pay | Admitting: Family Medicine

## 2012-08-04 DIAGNOSIS — I639 Cerebral infarction, unspecified: Secondary | ICD-10-CM

## 2012-10-08 ENCOUNTER — Other Ambulatory Visit: Payer: Self-pay | Admitting: Family Medicine

## 2012-10-09 ENCOUNTER — Other Ambulatory Visit: Payer: Self-pay | Admitting: Family Medicine

## 2012-12-29 ENCOUNTER — Other Ambulatory Visit: Payer: Self-pay | Admitting: Family Medicine

## 2013-02-26 ENCOUNTER — Other Ambulatory Visit: Payer: Self-pay | Admitting: Physician Assistant

## 2013-02-27 NOTE — Telephone Encounter (Signed)
Blood pressure medication refills requested.    [phone]

## 2013-03-01 ENCOUNTER — Other Ambulatory Visit: Payer: Self-pay | Admitting: Family Medicine

## 2013-03-01 DIAGNOSIS — I10 Essential (primary) hypertension: Secondary | ICD-10-CM

## 2013-03-01 MED ORDER — LOSARTAN POTASSIUM 100 MG PO TABS: 100.0000 mg | ORAL_TABLET | Freq: Every day | ORAL | Status: DC

## 2013-03-01 MED ORDER — DILTIAZEM HCL ER COATED BEADS 240 MG PO CP24: 240.0000 mg | ORAL_CAPSULE | Freq: Every day | ORAL | Status: DC

## 2013-03-01 MED ORDER — LABETALOL HCL 200 MG PO TABS: 200.0000 mg | ORAL_TABLET | Freq: Two times a day (BID) | ORAL | Status: DC

## 2013-03-20 ENCOUNTER — Other Ambulatory Visit: Payer: Self-pay | Admitting: Physician Assistant

## 2013-03-21 NOTE — Telephone Encounter (Signed)
Dr L, I had given pt message on last months RF needs OV for more, but see that you just RFd all other meds for 1 yr. Do you want to RF this too?

## 2013-04-18 ENCOUNTER — Ambulatory Visit: Payer: Medicare Other | Admitting: Family Medicine

## 2013-05-16 ENCOUNTER — Ambulatory Visit (INDEPENDENT_AMBULATORY_CARE_PROVIDER_SITE_OTHER): Payer: Medicare Other | Admitting: Family Medicine

## 2013-05-16 ENCOUNTER — Encounter: Payer: Self-pay | Admitting: Family Medicine

## 2013-05-16 VITALS — BP 140/84 | HR 86 | Resp 16 | Wt 192.0 lb

## 2013-05-16 DIAGNOSIS — I10 Essential (primary) hypertension: Secondary | ICD-10-CM

## 2013-05-16 DIAGNOSIS — Z125 Encounter for screening for malignant neoplasm of prostate: Secondary | ICD-10-CM

## 2013-05-16 DIAGNOSIS — I6350 Cerebral infarction due to unspecified occlusion or stenosis of unspecified cerebral artery: Secondary | ICD-10-CM

## 2013-05-16 DIAGNOSIS — I639 Cerebral infarction, unspecified: Secondary | ICD-10-CM

## 2013-05-16 LAB — COMPREHENSIVE METABOLIC PANEL
ALT: 15 U/L (ref 0–53)
AST: 14 U/L (ref 0–37)
Albumin: 4.3 g/dL (ref 3.5–5.2)
Alkaline Phosphatase: 85 U/L (ref 39–117)
BUN: 15 mg/dL (ref 6–23)
CO2: 35 mEq/L — ABNORMAL HIGH (ref 19–32)
Calcium: 9.6 mg/dL (ref 8.4–10.5)
Chloride: 99 mEq/L (ref 96–112)
Creat: 0.94 mg/dL (ref 0.50–1.35)
Glucose, Bld: 106 mg/dL — ABNORMAL HIGH (ref 70–99)
Potassium: 2.9 mEq/L — ABNORMAL LOW (ref 3.5–5.3)
Sodium: 143 mEq/L (ref 135–145)
Total Bilirubin: 0.5 mg/dL (ref 0.2–1.2)
Total Protein: 6.7 g/dL (ref 6.0–8.3)

## 2013-05-16 LAB — CBC
HCT: 35.2 % — ABNORMAL LOW (ref 39.0–52.0)
Hemoglobin: 12.1 g/dL — ABNORMAL LOW (ref 13.0–17.0)
MCH: 27.5 pg (ref 26.0–34.0)
MCHC: 34.4 g/dL (ref 30.0–36.0)
MCV: 80 fL (ref 78.0–100.0)
Platelets: 226 10*3/uL (ref 150–400)
RBC: 4.4 MIL/uL (ref 4.22–5.81)
RDW: 14.7 % (ref 11.5–15.5)
WBC: 8.5 10*3/uL (ref 4.0–10.5)

## 2013-05-16 LAB — POCT URINALYSIS DIPSTICK
Bilirubin, UA: NEGATIVE
Glucose, UA: NEGATIVE
Ketones, UA: NEGATIVE
Leukocytes, UA: NEGATIVE
Nitrite, UA: NEGATIVE
Protein, UA: 30
Spec Grav, UA: 1.02
Urobilinogen, UA: 0.2
pH, UA: 6

## 2013-05-16 MED ORDER — DILTIAZEM HCL ER COATED BEADS 240 MG PO CP24: 240.0000 mg | ORAL_CAPSULE | Freq: Every day | ORAL | Status: DC

## 2013-05-16 MED ORDER — LABETALOL HCL 200 MG PO TABS: 200.0000 mg | ORAL_TABLET | Freq: Two times a day (BID) | ORAL | Status: DC

## 2013-05-16 MED ORDER — BACLOFEN 10 MG PO TABS: 10.0000 mg | ORAL_TABLET | Freq: Three times a day (TID) | ORAL | Status: DC

## 2013-05-16 MED ORDER — LOSARTAN POTASSIUM 100 MG PO TABS: 100.0000 mg | ORAL_TABLET | Freq: Every day | ORAL | Status: DC

## 2013-05-16 NOTE — Progress Notes (Addendum)
Subjective:  This chart was scribed for Robyn Haber, MD by Einar Pheasant, ED Scribe. This patient was seen in room 24 and the patient's care was started at 11:58 AM.   Patient ID: Steve Burton, male    DOB: 1955/03/20, 58 y.o.   MRN: [id number]  Chief Complaint  Patient presents with   Hypertension    needs refill meds pended   needs new wheel chair    pended order    HPI HPI Comments: Steve Burton is a 58 y.o. male who presents to the [facility] for his hypertension. Pt states that he needs a refill on his medication.  Pt states that he is doing well. He is able to keep up with his daily activities.   Pt also states that he is in need of a new wheel chair and would like for [place] to fill out this paperwork. He states that they need a note from me affirming his need.   He states that he's noticed a strong odor to his urine for the past week.   Steve Burton BP was taken manually in the room and measured to be 140/80   Patient Active Problem List   Diagnosis Date Noted   Left spastic hemiparesis 03/07/2011   CVA (cerebral infarction) 02/14/2011   ESSENTIAL HYPERTENSION 08/02/2008   Past Medical History  Diagnosis Date   Stroke    Hypertension    Past Surgical History  Procedure Laterality Date   Hernia repair  2005   Cataract extraction  2006    right eye   Allergies  Allergen Reactions   Penicillins    Phenytoin Sodium Extended    Prior to Admission medications   Medication Sig Start Date End Date Taking? Authorizing Provider  baclofen (LIORESAL) 10 MG tablet Take 1 tablet (10 mg total) by mouth 3 (three) times daily.   Yes Robyn Haber, MD  diltiazem (CARTIA XT) 240 MG 24 hr capsule Take 1 capsule (240 mg total) by mouth daily. 03/01/13  Yes Robyn Haber, MD  labetalol (NORMODYNE) 200 MG tablet Take 1 tablet (200 mg total) by mouth 2 (two) times daily. 03/01/13  Yes Robyn Haber, MD  losartan (COZAAR) 100 MG tablet Take 1  tablet (100 mg total) by mouth daily. 03/01/13  Yes Robyn Haber, MD  Multiple Vitamin (MULTIVITAMIN) tablet Take 1 tablet by mouth daily.   Yes Historical Provider, MD  triamcinolone cream (KENALOG) 0.1 % Apply topically 2 (two) times daily. 12/13/11  Yes Robyn Haber, MD   History   Social History   Marital Status: Married    Spouse Name: N/A    Number of Children: N/A   Years of Education: N/A   Occupational History   Not on file.   Social History Main Topics   Smoking status: Never Smoker    Smokeless tobacco: Never Used   Alcohol Use: Not on file   Drug Use: Not on file   Sexual Activity: Not on file   Other Topics Concern   Not on file   Social History Narrative   No narrative on file   Review of Systems A complete 10 system review of systems was obtained and all systems are negative except as noted in the HPI and PMH.   Objective:   Physical Exam  Nursing note and vitals reviewed. Constitutional: He appears well-developed and well-nourished. No distress.  HENT:  Head: Normocephalic and atraumatic.  Eyes: Conjunctivae are normal. Right eye exhibits no discharge. Left  eye exhibits no discharge.  Neck: Neck supple.  Cardiovascular: Normal rate, regular rhythm and normal heart sounds.  Exam reveals no gallop and no friction rub.   No murmur heard. BP was measured to be 140/80.  Pulmonary/Chest: Effort normal and breath sounds normal. No respiratory distress.  Abdominal: Soft. He exhibits no distension. There is no tenderness.  Musculoskeletal: He exhibits no edema and no tenderness.  Neurological: He is alert. Coordination abnormal.  Patient remains largely hemiparetic on left with intermittent muscle spasms involving his entire left side  Skin: Skin is warm and dry.  Psychiatric: He has a normal mood and affect. His behavior is normal. Thought content normal.   Filed Vitals:   05/16/13 1145  BP: 140/84  Pulse: 86  Resp: 16  Weight: 192 lb  (87.091 kg)  SpO2: 95%   Wt Readings from Last 3 Encounters:  05/16/13 192 lb (87.091 kg)  05/30/12 174 lb 9.6 oz (79.198 kg)  03/07/11 190 lb (86.183 kg)   BP Readings from Last 3 Encounters:  05/16/13 140/84  05/30/12 160/94  12/13/11 120/80   Assessment & Plan:   Hypertension - Plan: diltiazem (CARTIA XT) 240 MG 24 hr capsule, losartan (COZAAR) 100 MG tablet, labetalol (NORMODYNE) 200 MG tablet, CBC, Comprehensive metabolic panel, PSA, POCT urinalysis dipstick  CVA (cerebral infarction) - Plan: DME Wheelchair manual, baclofen (LIORESAL) 10 MG tablet, Ambulatory referral to Physical Therapy  Annual physical exam - Plan: PSA  Signed, Robyn Haber, MD    I personally performed the services described in this documentation, which was scribed in my presence. The recorded information has been reviewed and is accurate. Results for orders placed in visit on 05/16/13  CBC      Result Value Ref Range   WBC 8.5  4.0 - 10.5 K/uL   RBC 4.40  4.22 - 5.81 MIL/uL   Hemoglobin 12.1 (*) 13.0 - 17.0 g/dL   HCT 35.2 (*) 39.0 - 52.0 %   MCV 80.0  78.0 - 100.0 fL   MCH 27.5  26.0 - 34.0 pg   MCHC 34.4  30.0 - 36.0 g/dL   RDW 14.7  11.5 - 15.5 %   Platelets 226  150 - 400 K/uL  COMPREHENSIVE METABOLIC PANEL      Result Value Ref Range   Sodium 143  135 - 145 mEq/L   Potassium 2.9 (*) 3.5 - 5.3 mEq/L   Chloride 99  96 - 112 mEq/L   CO2 35 (*) 19 - 32 mEq/L   Glucose, Bld 106 (*) 70 - 99 mg/dL   BUN 15  6 - 23 mg/dL   Creat 0.94  0.50 - 1.35 mg/dL   Total Bilirubin 0.5  0.2 - 1.2 mg/dL   Alkaline Phosphatase 85  39 - 117 U/L   AST 14  0 - 37 U/L   ALT 15  0 - 53 U/L   Total Protein 6.7  6.0 - 8.3 g/dL   Albumin 4.3  3.5 - 5.2 g/dL   Calcium 9.6  8.4 - 10.5 mg/dL  PSA      Result Value Ref Range   PSA 1.11  <=4.00 ng/mL  POCT URINALYSIS DIPSTICK      Result Value Ref Range   Color, UA yellow     Clarity, UA clear     Glucose, UA neg     Bilirubin, UA neg     Ketones, UA neg      Spec Grav, UA 1.020  Blood, UA trace     pH, UA 6.0     Protein, UA 30     Urobilinogen, UA 0.2     Nitrite, UA neg     Leukocytes, UA Negative

## 2013-05-17 LAB — PSA: PSA: 1.11 ng/mL (ref <=4.00)

## 2013-05-17 MED ORDER — POTASSIUM CHLORIDE CRYS ER 20 MEQ PO TBCR: 20.0000 meq | EXTENDED_RELEASE_TABLET | Freq: Every day | ORAL | Status: DC

## 2013-05-17 NOTE — Addendum Note (Signed)
Addended by: Robyn Haber on: 05/17/2013 08:43 AM   Modules accepted: Orders

## 2013-05-24 ENCOUNTER — Other Ambulatory Visit: Payer: Self-pay

## 2013-05-24 MED ORDER — POTASSIUM CHLORIDE CRYS ER 20 MEQ PO TBCR: 20.0000 meq | EXTENDED_RELEASE_TABLET | Freq: Every day | ORAL | Status: DC

## 2013-05-29 ENCOUNTER — Telehealth: Payer: Self-pay

## 2013-05-29 NOTE — Telephone Encounter (Signed)
Patient states that Dr. Carlean Jews wrote him a rx for a wheelchair. Patient states that company needs the written order faxed to them. Fax #: [phone]. Facility name: [facility]. Thank you.

## 2013-05-29 NOTE — Telephone Encounter (Signed)
Faxed order as requested.

## 2013-06-05 ENCOUNTER — Telehealth: Payer: Self-pay

## 2013-06-05 NOTE — Telephone Encounter (Signed)
Wheelchair order printed and faxed as requested.

## 2013-06-05 NOTE — Telephone Encounter (Signed)
Please refax the wheel chair order to [facility]   [phone]  Dr. Carlean Jews

## 2013-06-13 MED ORDER — POTASSIUM CHLORIDE CRYS ER 20 MEQ PO TBCR: 20.0000 meq | EXTENDED_RELEASE_TABLET | Freq: Every day | ORAL | Status: DC

## 2013-06-13 NOTE — Telephone Encounter (Signed)
Patient has low potassium which may account for his cramps to some extent.  I wrote a prescription with this note one week ago. We need to recheck the potassium one week after starting the potassium

## 2013-06-13 NOTE — Telephone Encounter (Signed)
Please review labs for pt from 5/14. He never has gotten his results.

## 2013-06-14 NOTE — Telephone Encounter (Signed)
LMVM to Cb.

## 2013-06-14 NOTE — Telephone Encounter (Signed)
Left message on machine to call back  

## 2013-06-18 NOTE — Telephone Encounter (Signed)
lmom to cb. 

## 2013-06-18 NOTE — Telephone Encounter (Signed)
Pt notified of results

## 2013-07-29 ENCOUNTER — Encounter: Payer: Self-pay | Admitting: Family Medicine

## 2013-08-07 ENCOUNTER — Telehealth: Payer: Self-pay

## 2013-08-07 MED ORDER — UNABLE TO FIND: Status: DC

## 2013-08-07 NOTE — Telephone Encounter (Signed)
Pt had emailed Dr L and asked for a Rx to have his dinosaur splint repaired. I contacted pt and then the [facility] to find out what is needed. They need a Rx saying "Repair Dynamic Splint as needed". Printed and had Heather sign Rx in Dr L's absence. Faxed to the [place].

## 2013-09-12 ENCOUNTER — Encounter: Payer: Self-pay | Admitting: Family Medicine

## 2013-09-12 ENCOUNTER — Ambulatory Visit (INDEPENDENT_AMBULATORY_CARE_PROVIDER_SITE_OTHER): Payer: Medicare Other | Admitting: Family Medicine

## 2013-09-12 VITALS — BP 169/94 | HR 81 | Temp 98.5°F | Resp 16 | Ht 72.0 in | Wt 172.0 lb

## 2013-09-12 DIAGNOSIS — I6350 Cerebral infarction due to unspecified occlusion or stenosis of unspecified cerebral artery: Secondary | ICD-10-CM

## 2013-09-12 DIAGNOSIS — I10 Essential (primary) hypertension: Secondary | ICD-10-CM

## 2013-09-12 DIAGNOSIS — H6120 Impacted cerumen, unspecified ear: Secondary | ICD-10-CM

## 2013-09-12 DIAGNOSIS — E876 Hypokalemia: Secondary | ICD-10-CM

## 2013-09-12 DIAGNOSIS — H6121 Impacted cerumen, right ear: Secondary | ICD-10-CM

## 2013-09-12 NOTE — Progress Notes (Signed)
S:  Needs forms filled out. Objective:  130/80    Patient ID: Steve Burton MRN: [id number], DOB: March 19, 1955, 58 y.o. Date of Encounter: 09/12/2013, 11:43 AM  Primary Physician: Robyn Haber, MD  Chief Complaint: HTN  HPI: 58 y.o. year old male with history below presents for hypertension follow up. Lives with wife [name] from [place], where he hopes to spend Christmas this year.  He recently had splint fitted for left arm.  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  He relies on wheelchair  No CP, HA, visual changes, or focal deficits.   Past Medical History  Diagnosis Date  . Stroke   . Hypertension      Home Meds: Prior to Admission medications   Medication Sig Start Date End Date Taking? Authorizing Provider  baclofen (LIORESAL) 10 MG tablet Take 1 tablet (10 mg total) by mouth 3 (three) times daily. 05/16/13  Yes Robyn Haber, MD  diltiazem (CARTIA XT) 240 MG 24 hr capsule Take 1 capsule (240 mg total) by mouth daily. 05/16/13  Yes Robyn Haber, MD  labetalol (NORMODYNE) 200 MG tablet Take 1 tablet (200 mg total) by mouth 2 (two) times daily. 05/16/13  Yes Robyn Haber, MD  losartan (COZAAR) 100 MG tablet Take 1 tablet (100 mg total) by mouth daily. 05/16/13  Yes Robyn Haber, MD  terbinafine (LAMISIL) 250 MG tablet Take 250 mg by mouth daily.   Yes Historical Provider, MD  Multiple Vitamin (MULTIVITAMIN) tablet Take 1 tablet by mouth daily.    Historical Provider, MD  UNABLE TO FIND Repair to Dynamic Splint as needed. Dx code: 342.10 08/07/13   Collene Leyden, PA-C    Allergies:  Allergies  Allergen Reactions  . Penicillins   . Phenytoin Sodium Extended     History   Social History  . Marital Status: Married    Spouse Name: N/A    Number of Children: N/A  . Years of Education: N/A   Occupational History  .  Not on file.   Social History Main Topics  . Smoking status: Never Smoker   . Smokeless tobacco: Never Used  . Alcohol Use: Not on file  . Drug Use: Not on file  . Sexual Activity: Not on file   Other Topics Concern  . Not on file   Social History Narrative  . No narrative on file     Family History  Problem Relation Age of Onset  . Stroke Mother   . Heart disease Father     Review of Systems: Constitutional: negative for chills, fever, night sweats, weight changes, or fatigue  HEENT: negative for vision changes, hearing loss, congestion, rhinorrhea, ST, epistaxis, or sinus pressure Cardiovascular: negative for chest pain, palpitations, or DOE Respiratory: negative for hemoptysis, wheezing, shortness of breath, or cough Abdominal: negative for abdominal pain, nausea, vomiting, diarrhea, or constipation Dermatological: negative for rash Neurologic: negative for headache, dizziness, or syncope All other systems reviewed and are otherwise negative with the exception to those above and in the HPI.   Physical Exam:  Recheck 134/84 Blood pressure 169/94, pulse 81, temperature 98.5 F (36.9 C), temperature source Oral, resp. rate 16, height 6' (1.829 m), weight 172 lb (78.019 kg), SpO2 98.00%., Body mass index is 23.32 kg/(m^2). BP Readings from Last 3 Encounters:  09/12/13 169/94  05/16/13 140/84  05/30/12 160/94   Wt Readings from Last 3  Encounters:  09/12/13 172 lb (78.019 kg)  05/16/13 192 lb (87.091 kg)  05/30/12 174 lb 9.6 oz (79.198 kg)   General: Well developed, well nourished, in no acute distress. Head: Normocephalic, atraumatic, eyes without discharge, sclera non-icteric, nares are without discharge. Bilateral auditory canals cleared after lavage on right, TM's are without perforation, pearly grey and translucent with reflective cone of light bilaterally. Oral cavity moist, posterior pharynx without exudate, erythema, peritonsillar abscess, or post nasal  drip.  Neck: Supple. No thyromegaly. Full ROM. No lymphadenopathy. No carotid bruits. Lungs: Clear bilaterally to auscultation without wheezes, rales, or rhonchi. Breathing is unlabored. Heart: RRR with S1 S2. No murmurs, rubs, or gallops appreciated.  Msk:  Strength and tone normal for age. Extremities/Skin: Warm and dry. No clubbing or cyanosis. No edema. No rashes or suspicious lesions. Distal pulses 2+ and equal bilaterally. Neuro: Alert and oriented X 3. Left hemiplegia, dense.  No contractures. Psych:  Responds to questions appropriately and has some frontal release signs in that he gets overly emotional with stress or sadness.   Labs: Lab Results  Component Value Date   HGBA1C  Value: 6.1 (NOTE) The ADA recommends the following therapeutic goal for glycemic control related to Hgb A1c measurement: Goal of therapy: <6.5 Hgb A1c  Reference: American Diabetes Association: Clinical Practice Recommendations 2010, Diabetes Care, 2010, 33: (Suppl  1). 04/07/2009   Lengthy insurance forms completed by me CMP pending 40 minutes face to face  ASSESSMENT AND PLAN:  58 y.o. year old male with hypertension, h/o CVA in 2011 with dense left hemiparesis (he was left hand dominant).   Cerebral infarction due to cerebral artery occlusion  Essential hypertension  Hypokalemia - Plan: Comprehensive metabolic panel  Cerumen impaction, right  Signed, Robyn Haber, MD 09/12/2013 11:43 AM

## 2013-09-13 LAB — COMPREHENSIVE METABOLIC PANEL
ALT: 12 U/L (ref 0–53)
AST: 12 U/L (ref 0–37)
Albumin: 4.5 g/dL (ref 3.5–5.2)
Alkaline Phosphatase: 93 U/L (ref 39–117)
BUN: 13 mg/dL (ref 6–23)
CO2: 34 mEq/L — ABNORMAL HIGH (ref 19–32)
Calcium: 9.8 mg/dL (ref 8.4–10.5)
Chloride: 100 mEq/L (ref 96–112)
Creat: 0.99 mg/dL (ref 0.50–1.35)
Glucose, Bld: 114 mg/dL — ABNORMAL HIGH (ref 70–99)
Potassium: 3.2 mEq/L — ABNORMAL LOW (ref 3.5–5.3)
Sodium: 144 mEq/L (ref 135–145)
Total Bilirubin: 0.5 mg/dL (ref 0.2–1.2)
Total Protein: 7.1 g/dL (ref 6.0–8.3)

## 2013-09-20 ENCOUNTER — Encounter: Payer: Self-pay | Admitting: *Deleted

## 2013-10-03 ENCOUNTER — Telehealth: Payer: Self-pay | Admitting: *Deleted

## 2013-10-03 NOTE — Telephone Encounter (Signed)
Phoned & [place] (generic-name, title & where calling from and which provider's office) and my direct #.  Phoned to schedule [facility] & colo/fobt screening

## 2013-10-11 ENCOUNTER — Telehealth: Payer: Self-pay | Admitting: *Deleted

## 2013-10-11 NOTE — Telephone Encounter (Signed)
Phoned & [facility] (generic) for patient

## 2013-10-23 ENCOUNTER — Other Ambulatory Visit: Payer: Self-pay | Admitting: Family Medicine

## 2013-10-23 DIAGNOSIS — J209 Acute bronchitis, unspecified: Secondary | ICD-10-CM

## 2013-10-23 MED ORDER — AZITHROMYCIN 250 MG PO TABS: ORAL_TABLET | ORAL | Status: DC

## 2013-11-24 ENCOUNTER — Other Ambulatory Visit: Payer: Self-pay | Admitting: Family Medicine

## 2013-12-04 ENCOUNTER — Other Ambulatory Visit: Payer: Self-pay | Admitting: Family Medicine

## 2013-12-04 DIAGNOSIS — I10 Essential (primary) hypertension: Secondary | ICD-10-CM

## 2013-12-04 MED ORDER — DILTIAZEM HCL ER COATED BEADS 240 MG PO CP24: 240.0000 mg | ORAL_CAPSULE | Freq: Every day | ORAL | Status: DC

## 2014-02-24 ENCOUNTER — Other Ambulatory Visit: Payer: Self-pay | Admitting: Family Medicine

## 2014-02-24 DIAGNOSIS — I10 Essential (primary) hypertension: Secondary | ICD-10-CM

## 2014-02-24 MED ORDER — DILTIAZEM HCL ER COATED BEADS 240 MG PO CP24: 240.0000 mg | ORAL_CAPSULE | Freq: Every day | ORAL | Status: DC

## 2014-04-08 ENCOUNTER — Encounter: Payer: Self-pay | Admitting: *Deleted

## 2014-05-11 ENCOUNTER — Other Ambulatory Visit: Payer: Self-pay | Admitting: Family Medicine

## 2014-05-11 DIAGNOSIS — I10 Essential (primary) hypertension: Secondary | ICD-10-CM

## 2014-05-11 MED ORDER — LOSARTAN POTASSIUM 100 MG PO TABS: 100.0000 mg | ORAL_TABLET | Freq: Every day | ORAL | Status: DC

## 2014-05-11 MED ORDER — LABETALOL HCL 200 MG PO TABS: 200.0000 mg | ORAL_TABLET | Freq: Two times a day (BID) | ORAL | Status: DC

## 2014-06-09 ENCOUNTER — Other Ambulatory Visit: Payer: Self-pay | Admitting: Family Medicine

## 2014-06-17 ENCOUNTER — Other Ambulatory Visit: Payer: Self-pay

## 2014-06-17 MED ORDER — BACLOFEN 10 MG PO TABS: ORAL_TABLET | ORAL | Status: DC

## 2014-06-19 ENCOUNTER — Other Ambulatory Visit: Payer: Self-pay | Admitting: Family Medicine

## 2014-06-19 DIAGNOSIS — I639 Cerebral infarction, unspecified: Secondary | ICD-10-CM

## 2014-06-19 MED ORDER — BACLOFEN 10 MG PO TABS: ORAL_TABLET | ORAL | Status: DC

## 2014-07-25 ENCOUNTER — Ambulatory Visit (INDEPENDENT_AMBULATORY_CARE_PROVIDER_SITE_OTHER): Payer: Medicare Other | Admitting: Family Medicine

## 2014-07-25 VITALS — BP 154/82 | HR 62 | Temp 98.6°F | Resp 18

## 2014-07-25 DIAGNOSIS — Z0000 Encounter for general adult medical examination without abnormal findings: Secondary | ICD-10-CM

## 2014-07-25 DIAGNOSIS — H269 Unspecified cataract: Secondary | ICD-10-CM | POA: Diagnosis not present

## 2014-07-25 DIAGNOSIS — E876 Hypokalemia: Secondary | ICD-10-CM | POA: Diagnosis not present

## 2014-07-25 DIAGNOSIS — H6123 Impacted cerumen, bilateral: Secondary | ICD-10-CM | POA: Diagnosis not present

## 2014-07-25 DIAGNOSIS — I639 Cerebral infarction, unspecified: Secondary | ICD-10-CM

## 2014-07-25 DIAGNOSIS — Z23 Encounter for immunization: Secondary | ICD-10-CM

## 2014-07-25 DIAGNOSIS — I10 Essential (primary) hypertension: Secondary | ICD-10-CM | POA: Diagnosis not present

## 2014-07-25 DIAGNOSIS — R9431 Abnormal electrocardiogram [ECG] [EKG]: Secondary | ICD-10-CM | POA: Diagnosis not present

## 2014-07-25 LAB — COMPLETE METABOLIC PANEL WITH GFR
ALT: 11 U/L (ref 0–53)
AST: 12 U/L (ref 0–37)
Albumin: 4.4 g/dL (ref 3.5–5.2)
Alkaline Phosphatase: 94 U/L (ref 39–117)
BUN: 14 mg/dL (ref 6–23)
CO2: 39 mEq/L — ABNORMAL HIGH (ref 19–32)
Calcium: 10 mg/dL (ref 8.4–10.5)
Chloride: 97 mEq/L (ref 96–112)
Creat: 1.04 mg/dL (ref 0.50–1.35)
GFR, Est African American: >89 mL/min
GFR, Est Non African American: 79 mL/min
Glucose, Bld: 140 mg/dL — ABNORMAL HIGH (ref 70–99)
Potassium: 2.5 mEq/L — CL (ref 3.5–5.3)
Sodium: 147 mEq/L — ABNORMAL HIGH (ref 135–145)
Total Bilirubin: 0.6 mg/dL (ref 0.2–1.2)
Total Protein: 7 g/dL (ref 6.0–8.3)

## 2014-07-25 LAB — POCT CBC
Granulocyte percent: 71.8 %G (ref 37–80)
HCT, POC: 35.8 % — AB (ref 43.5–53.7)
Hemoglobin: 12.4 g/dL — AB (ref 14.1–18.1)
Lymph, poc: 1.7 (ref 0.6–3.4)
MCH, POC: 27.4 pg (ref 27–31.2)
MCHC: 34.7 g/dL (ref 31.8–35.4)
MCV: 79 fL — AB (ref 80–97)
MID (cbc): 0.7 (ref 0–0.9)
MPV: 7.6 fL (ref 0–99.8)
POC Granulocyte: 6.2 (ref 2–6.9)
POC LYMPH PERCENT: 19.9 %L (ref 10–50)
POC MID %: 8.3 %M (ref 0–12)
Platelet Count, POC: 209 10*3/uL (ref 142–424)
RBC: 4.53 M/uL — AB (ref 4.69–6.13)
RDW, POC: 14 %
WBC: 8.7 10*3/uL (ref 4.6–10.2)

## 2014-07-25 LAB — LIPID PANEL
Cholesterol: 220 mg/dL — ABNORMAL HIGH (ref 0–200)
HDL: 47 mg/dL (ref >=40)
LDL Cholesterol: 141 mg/dL — ABNORMAL HIGH (ref 0–99)
Total CHOL/HDL Ratio: 4.7 Ratio
Triglycerides: 159 mg/dL — ABNORMAL HIGH (ref <150)
VLDL: 32 mg/dL (ref 0–40)

## 2014-07-25 LAB — THYROID PANEL WITH TSH
Free Thyroxine Index: 2.5 (ref 1.4–3.8)
T3 Uptake: 29 % (ref 22–35)
T4, Total: 8.7 ug/dL (ref 4.5–12.0)
TSH: 1.609 u[IU]/mL (ref 0.350–4.500)

## 2014-07-25 MED ORDER — POTASSIUM CHLORIDE CRYS ER 20 MEQ PO TBCR
20.0000 meq | EXTENDED_RELEASE_TABLET | Freq: Every day | ORAL | Status: DC
Start: 2014-07-25 — End: 2014-09-04

## 2014-07-25 MED ORDER — LOSARTAN POTASSIUM 100 MG PO TABS: 100.0000 mg | ORAL_TABLET | Freq: Every day | ORAL | Status: DC

## 2014-07-25 MED ORDER — LABETALOL HCL 200 MG PO TABS: 200.0000 mg | ORAL_TABLET | Freq: Two times a day (BID) | ORAL | Status: DC

## 2014-07-25 MED ORDER — BACLOFEN 10 MG PO TABS: ORAL_TABLET | ORAL | Status: DC

## 2014-07-25 MED ORDER — DILTIAZEM HCL ER COATED BEADS 240 MG PO CP24: 240.0000 mg | ORAL_CAPSULE | Freq: Every day | ORAL | Status: DC

## 2014-07-25 NOTE — Patient Instructions (Addendum)
Health Maintenance A healthy lifestyle and preventative care can promote health and wellness.  Maintain regular health, dental, and eye exams.  Eat a healthy diet. Foods like vegetables, fruits, whole grains, low-fat dairy products, and lean protein foods contain the nutrients you need and are low in calories. Decrease your intake of foods high in solid fats, added sugars, and salt. Get information about a proper diet from your health care provider, if necessary.  Regular physical exercise is one of the most important things you can do for your health. Most adults should get at least 150 minutes of moderate-intensity exercise (any activity that increases your heart rate and causes you to sweat) each week. In addition, most adults need muscle-strengthening exercises on 2 or more days a week.   Maintain a healthy weight. The body mass index (BMI) is a screening tool to identify possible weight problems. It provides an estimate of body fat based on height and weight. Your health care provider can find your BMI and can help you achieve or maintain a healthy weight. For males 20 years and older:  A BMI below 18.5 is considered underweight.  A BMI of 18.5 to 24.9 is normal.  A BMI of 25 to 29.9 is considered overweight.  A BMI of 30 and above is considered obese.  Maintain normal blood lipids and cholesterol by exercising and minimizing your intake of saturated fat. Eat a balanced diet with plenty of fruits and vegetables. Blood tests for lipids and cholesterol should begin at age 20 and be repeated every 5 years. If your lipid or cholesterol levels are high, you are over age 50, or you are at high risk for heart disease, you may need your cholesterol levels checked more frequently.Ongoing high lipid and cholesterol levels should be treated with medicines if diet and exercise are not working.  If you smoke, find out from your health care provider how to quit. If you do not use tobacco, do not  start.  Lung cancer screening is recommended for adults aged 55-80 years who are at high risk for developing lung cancer because of a history of smoking. A yearly low-dose CT scan of the lungs is recommended for people who have at least a 30-pack-year history of smoking and are current smokers or have quit within the past 15 years. A pack year of smoking is smoking an average of 1 pack of cigarettes a day for 1 year (for example, a 30-pack-year history of smoking could mean smoking 1 pack a day for 30 years or 2 packs a day for 15 years). Yearly screening should continue until the smoker has stopped smoking for at least 15 years. Yearly screening should be stopped for people who develop a health problem that would prevent them from having lung cancer treatment.  If you choose to drink alcohol, do not have more than 2 drinks per day. One drink is considered to be 12 oz (360 mL) of beer, 5 oz (150 mL) of wine, or 1.5 oz (45 mL) of liquor.  Avoid the use of street drugs. Do not share needles with anyone. Ask for help if you need support or instructions about stopping the use of drugs.  High blood pressure causes heart disease and increases the risk of stroke. Blood pressure should be checked at least every 1-2 years. Ongoing high blood pressure should be treated with medicines if weight loss and exercise are not effective.  If you are 45-79 years old, ask your health care provider if   you should take aspirin to prevent heart disease.  Diabetes screening involves taking a blood sample to check your fasting blood sugar level. This should be done once every 3 years after age 45 if you are at a normal weight and without risk factors for diabetes. Testing should be considered at a younger age or be carried out more frequently if you are overweight and have at least 1 risk factor for diabetes.  Colorectal cancer can be detected and often prevented. Most routine colorectal cancer screening begins at the age of 50  and continues through age 75. However, your health care provider may recommend screening at an earlier age if you have risk factors for colon cancer. On a yearly basis, your health care provider may provide home test kits to check for hidden blood in the stool. A small camera at the end of a tube may be used to directly examine the colon (sigmoidoscopy or colonoscopy) to detect the earliest forms of colorectal cancer. Talk to your health care provider about this at age 50 when routine screening begins. A direct exam of the colon should be repeated every 5-10 years through age 75, unless early forms of precancerous polyps or small growths are found.  People who are at an increased risk for hepatitis B should be screened for this virus. You are considered at high risk for hepatitis B if:  You were born in a country where hepatitis B occurs often. Talk with your health care provider about which countries are considered high risk.  Your parents were born in a high-risk country and you have not received a shot to protect against hepatitis B (hepatitis B vaccine).  You have HIV or AIDS.  You use needles to inject street drugs.  You live with, or have sex with, someone who has hepatitis B.  You are a man who has sex with other men (MSM).  You get hemodialysis treatment.  You take certain medicines for conditions like cancer, organ transplantation, and autoimmune conditions.  Hepatitis C blood testing is recommended for all people born from 1945 through 1965 and any individual with known risk factors for hepatitis C.  Healthy men should no longer receive prostate-specific antigen (PSA) blood tests as part of routine cancer screening. Talk to your health care provider about prostate cancer screening.  Testicular cancer screening is not recommended for adolescents or adult males who have no symptoms. Screening includes self-exam, a health care provider exam, and other screening tests. Consult with your  health care provider about any symptoms you have or any concerns you have about testicular cancer.  Practice safe sex. Use condoms and avoid high-risk sexual practices to reduce the spread of sexually transmitted infections (STIs).  You should be screened for STIs, including gonorrhea and chlamydia if:  You are sexually active and are younger than 24 years.  You are older than 24 years, and your health care provider tells you that you are at risk for this type of infection.  Your sexual activity has changed since you were last screened, and you are at an increased risk for chlamydia or gonorrhea. Ask your health care provider if you are at risk.  If you are at risk of being infected with HIV, it is recommended that you take a prescription medicine daily to prevent HIV infection. This is called pre-exposure prophylaxis (PrEP). You are considered at risk if:  You are a man who has sex with other men (MSM).  You are a heterosexual man who   is sexually active with multiple partners.  You take drugs by injection.  You are sexually active with a partner who has HIV.  Talk with your health care provider about whether you are at high risk of being infected with HIV. If you choose to begin PrEP, you should first be tested for HIV. You should then be tested every 3 months for as long as you are taking PrEP.  Use sunscreen. Apply sunscreen liberally and repeatedly throughout the day. You should seek shade when your shadow is shorter than you. Protect yourself by wearing long sleeves, pants, a wide-brimmed hat, and sunglasses year round whenever you are outdoors.  Tell your health care provider of new moles or changes in moles, especially if there is a change in shape or color. Also, tell your health care provider if a mole is larger than the size of a pencil eraser.  A one-time screening for abdominal aortic aneurysm (AAA) and surgical repair of large AAAs by ultrasound is recommended for men aged  22-75 years who are current or former smokers.  Stay current with your vaccines (immunizations). Document Released: 06/18/2007 Document Revised: 12/25/2012 Document Reviewed: 05/17/2010 [facility] Patient Information 2015 [place], [place]. This information is not intended to replace advice given to you by your health care provider. Make sure you discuss any questions you have with your health care provider. Td Vaccine (Tetanus and Diphtheria): What You Need to Know 1. Why get vaccinated? Tetanus  and diphtheria are very serious diseases. They are rare in the [place] today, but people who do become infected often have severe complications. Td vaccine is used to protect adolescents and adults from both of these diseases. Both tetanus and diphtheria are infections caused by bacteria. Diphtheria spreads from person to person through coughing or sneezing. Tetanus-causing bacteria enter the body through cuts, scratches, or wounds. TETANUS (Lockjaw) causes painful muscle tightening and stiffness, usually all over the body. It can lead to tightening of muscles in the head and neck so you can't open your mouth, swallow, or sometimes even breathe. Tetanus kills about 1 out of every 5 people who are infected. DIPHTHERIA can cause a thick coating to form in the back of the throat. It can lead to breathing problems, paralysis, heart failure, and death. Before vaccines, the [place] saw as many as 200,000 cases a year of diphtheria and hundreds of cases of tetanus. Since vaccination began, cases of both diseases have dropped by about 99%. 2. Td vaccine Td vaccine can protect adolescents and adults from tetanus and diphtheria. Td is usually given as a booster dose every 10 years but it can also be given earlier after a severe and dirty wound or burn. Your doctor can give you more information. Td may safely be given at the same time as other vaccines. 3. Some people should not get this vaccine If you ever  had a life-threatening allergic reaction after a dose of any tetanus or diphtheria containing vaccine, OR if you have a severe allergy to any part of this vaccine, you should not get Td. Tell your doctor if you have any severe allergies. Talk to your doctor if you: have epilepsy or another nervous system problem, had severe pain or swelling after any vaccine containing diphtheria or tetanus, ever had Guillain Barr Syndrome (GBS), aren't feeling well on the day the shot is scheduled. 4. Risks of a vaccine reaction With a vaccine, like any medicine, there is a chance of side effects. These are usually mild  and go away on their own. Serious side effects are also possible, but are very rare. Most people who get Td vaccine do not have any problems with it. Mild Problems  following Td (Did not interfere with activities) Pain where the shot was given (about 8 people in 10) Redness or swelling where the shot was given (about 1 person in 3) Mild fever (about 1 person in 15) Headache or Tiredness (uncommon) Moderate Problems following Td (Interfered with activities, but did not require medical attention) Fever over 102F (rare) Severe Problems  following Td (Unable to perform usual activities; required medical attention) Swelling, severe pain, bleeding and/or redness in the arm where the shot was given (rare). Problems that could happen after any vaccine: Brief fainting spells can happen after any medical procedure, including vaccination. Sitting or lying down for about 15 minutes can help prevent fainting, and injuries caused by a fall. Tell your doctor if you feel dizzy, or have vision changes or ringing in the ears. Severe shoulder pain and reduced range of motion in the arm where a shot was given can happen, very rarely, after a vaccination. Severe allergic reactions from a vaccine are very rare, estimated at less than 1 in a million doses. If one were to occur, it would usually be within a few  minutes to a few hours after the vaccination. 5. What if there is a serious reaction? What should I look for? Look for anything that concerns you, such as signs of a severe allergic reaction, very high fever, or behavior changes. Signs of a severe allergic reaction can include hives, swelling of the face and throat, difficulty breathing, a fast heartbeat, dizziness, and weakness. These would usually start a few minutes to a few hours after the vaccination. What should I do? If you think it is a severe allergic reaction or other emergency that can't wait, call 9-1-1 or get the person to the nearest hospital. Otherwise, call your doctor. Afterward, the reaction should be reported to the Vaccine Adverse Event Reporting System (VAERS). Your doctor might file this report, or you can do it yourself through the VAERS web site at www.vaers.SamedayNews.es, or by calling 8033008182. VAERS is only for reporting reactions. They do not give medical advice. 6. The National Vaccine Injury Compensation Program The [employer] Vaccine Injury Compensation Program (VICP) is a federal program that was created to compensate people who may have been injured by certain vaccines. Persons who believe they may have been injured by a vaccine can learn about the program and about filing a claim by calling 551 260 4374 or visiting the [place] website at GoldCloset.com.ee. 7. How can I learn more? Ask your doctor. Contact your local or state health department. Contact the Centers for Disease Control and Prevention (CDC): Call 318 678 4145 (1-800-CDC-INFO) Visit CDC's website at http://hunter.com/ CDC Td Vaccine Interim VIS (02/07/12) Document Released: 10/17/2005 Document Revised: 05/06/2013 Document Reviewed: 04/03/2013 [facility] Patient Information 2015 [place], [place]. This information is not intended to replace advice given to you by your health care provider. Make sure you discuss any questions you have with  your health care provider.

## 2014-07-25 NOTE — Addendum Note (Signed)
Addended by: Robyn Haber on: 07/25/2014 10:51 AM   Modules accepted: Orders

## 2014-07-25 NOTE — Progress Notes (Addendum)
This chart was scribed for Robyn Haber, MD by Moises Blood, medical scribe at [facility].The patient was seen in exam room 9 and the patient's care was started at 8:52 AM.  Patient ID: Steve Burton MRN: [id number], DOB: 24-Oct-1955, 59 y.o. Date of Encounter: 07/25/2014  Primary Physician: Robyn Haber, MD  Chief Complaint:  Chief Complaint  Patient presents with   Annual Exam    HPI:  Steve Burton is a 59 y.o. male who presents to [facility] for a physical exam.  Pt had a stroke in the past and is now in a wheelchair. Pt states that he has become fatigue recently. He's also noted that his stool has become darker and a little constipated, though he passes movement everyday. He denies significant appetite change, urinary problems, breathing problems, or any change in vision and hearing.   He went to his physical therapist, Barbaraann Barthel, yesterday.   He had cataract surgery in his right eye. He has some cataracts diagnosed in his left eye by Dr. Katy Fitch, but blood pressure was too high to do the surgery on the left. He wants a colonoscopy done.   He was in [place] in January.  His wife is from [place].  He is a Nurse, adult.   Past Medical History  Diagnosis Date   Stroke    Hypertension      Home Meds: Prior to Admission medications   Medication Sig Start Date End Date Taking? Authorizing Provider  baclofen (LIORESAL) 10 MG tablet TAKE 1 TABLET BY MOUTH 3 TIMES DAILY. 06/19/14  Yes Robyn Haber, MD  diltiazem (CARTIA XT) 240 MG 24 hr capsule Take 1 capsule (240 mg total) by mouth daily. 02/24/14  Yes Robyn Haber, MD  labetalol (NORMODYNE) 200 MG tablet Take 1 tablet (200 mg total) by mouth 2 (two) times daily. 05/11/14  Yes Robyn Haber, MD  losartan (COZAAR) 100 MG tablet Take 1 tablet (100 mg total) by mouth daily. 05/11/14  Yes Robyn Haber, MD  Multiple Vitamin (MULTIVITAMIN) tablet Take 1 tablet by mouth  daily.   Yes Historical Provider, MD  UNABLE TO FIND Repair to Dynamic Splint as needed. Dx code: 342.10 08/07/13   Collene Leyden, PA-C    Allergies:  Allergies  Allergen Reactions   Penicillins    Phenytoin Sodium Extended     History   Social History   Marital Status: Married    Spouse Name: N/A   Number of Children: N/A   Years of Education: N/A   Occupational History   Not on file.   Social History Main Topics   Smoking status: Never Smoker    Smokeless tobacco: Never Used   Alcohol Use: Not on file   Drug Use: Not on file   Sexual Activity: Not on file   Other Topics Concern   Not on file   Social History Narrative     Review of Systems: Constitutional: negative for chills, fever, night sweats, or weight changes; positive for fatigue   HEENT: negative for vision changes, hearing loss, congestion, rhinorrhea, ST, epistaxis, or sinus pressure Cardiovascular: negative for chest pain or palpitations Respiratory: negative for hemoptysis, wheezing, shortness of breath, or cough Abdominal: negative for abdominal pain, nausea, vomiting, or diarrhea; positive for constipation Dermatological: negative for rash Neurologic: negative for headache, dizziness, or syncope GI: negative for urinary symptoms All other systems reviewed and are otherwise negative with the exception to those above and in the HPI.  Physical  Exam: Blood pressure 154/82, pulse 62, temperature 98.6 F (37 C), resp. rate 18, SpO2 98 %., There is no weight on file to calculate BMI. General: Well developed, well nourished, in no acute distress. Head: Normocephalic, atraumatic, eyes without discharge, sclera non-icteric, nares are without discharge. Bilateral auditory canals clear, TM's are without perforation, pearly grey and translucent with reflective cone of light bilaterally. Oral cavity moist, posterior pharynx without exudate, erythema, peritonsillar abscess, or post nasal drip. Left eye  has cataract  Neck: Supple. No thyromegaly. Full ROM. No lymphadenopathy.  Lungs: Clear bilaterally to auscultation without wheezes, rales, or rhonchi. Breathing is unlabored. Heart: RRR with S1 S2. No murmurs, rubs, or gallops appreciated. Abdomen: Soft, non-tender, non-distended with normoactive bowel sounds. No hepatomegaly. No rebound/guarding. No obvious abdominal masses. Rectal:  Normal prostate, anus Msk:  Strength and tone normal for age. Extremities/Skin: Warm and dry. No clubbing or cyanosis. No edema. No rashes or suspicious lesions. Neuro: Alert and oriented X 3. CNII-XII grossly in tact. dense left hemiparesis Psych:  Responds to questions appropriately with a normal affect. GU: prostate normal   Labs: Results for orders placed or performed in visit on 09/12/13  Comprehensive metabolic panel  Result Value Ref Range   Sodium 144 135 - 145 mEq/L   Potassium 3.2 (L) 3.5 - 5.3 mEq/L   Chloride 100 96 - 112 mEq/L   CO2 34 (H) 19 - 32 mEq/L   Glucose, Bld 114 (H) 70 - 99 mg/dL   BUN 13 6 - 23 mg/dL   Creat 0.99 0.50 - 1.35 mg/dL   Total Bilirubin 0.5 0.2 - 1.2 mg/dL   Alkaline Phosphatase 93 39 - 117 U/L   AST 12 0 - 37 U/L   ALT 12 0 - 53 U/L   Total Protein 7.1 6.0 - 8.3 g/dL   Albumin 4.5 3.5 - 5.2 g/dL   Calcium 9.8 8.4 - 10.5 mg/dL   EKG shows ST and T-wave changes in lateral precordial leads  ASSESSMENT AND PLAN:  59 y.o. year old male with  This chart was scribed in my presence and reviewed by me personally.    ICD-9-CM ICD-10-CM   1. Annual physical exam V70.0 Z00.00 POCT CBC     Lipid panel     COMPLETE METABOLIC PANEL WITH GFR     PSA     Thyroid Panel With TSH     Ambulatory referral to Gastroenterology     Td vaccine greater than or equal to 7yo preservative free IM  2. Essential hypertension 401.9 I10 labetalol (NORMODYNE) 200 MG tablet     diltiazem (CARTIA XT) 240 MG 24 hr capsule     losartan (COZAAR) 100 MG tablet  3. CVA (cerebral vascular  accident) 434.91 I63.9 baclofen (LIORESAL) 10 MG tablet  4. Cerumen impaction, bilateral 380.4 H61.23 Ear wax removal  5. Immunization due V05.9 Z23 Td vaccine greater than or equal to 7yo preservative free IM  6. Nonspecific abnormal electrocardiogram (ECG) (EKG) 794.31 R94.31 Ambulatory referral to Cardiology    Signed, Robyn Haber, MD 07/25/2014 9:48 AM

## 2014-07-25 NOTE — Addendum Note (Signed)
Addended by: Robyn Haber on: 07/25/2014 09:55 AM   Modules accepted: Level of Service

## 2014-07-25 NOTE — Addendum Note (Signed)
Addended by: Constance Goltz on: 07/25/2014 09:55 AM   Modules accepted: Miquel Dunn

## 2014-07-25 NOTE — Addendum Note (Signed)
Addended by: Robyn Haber on: 07/25/2014 02:09 PM   Modules accepted: Orders

## 2014-07-26 LAB — PSA: PSA: 1.92 ng/mL (ref <=4.00)

## 2014-07-28 ENCOUNTER — Encounter: Payer: Self-pay | Admitting: Gastroenterology

## 2014-08-01 ENCOUNTER — Encounter: Payer: Self-pay | Admitting: Family Medicine

## 2014-08-28 NOTE — Progress Notes (Signed)
     HPI: 59 yo male for evaluation of abnormal ECG. Seen previously by Dr Domenic Polite for hypertension but not since 2008. Echo 4/11 showed hyperdynamic LV function, moderate LVH, intracavitary gradient of 4 m/s, grade 1 diastolic dysfunction, SAM with peak LVOT gradient of 60 mmHg. Carotid dopplers 4/11 showed no stenosis bilaterally. Recently noted to have an abnormal electrocardiogram. We were asked to evaluate. Patient denies dyspnea, chest pain, palpitations or syncope.  Current Outpatient Prescriptions  Medication Sig Dispense Refill  . baclofen (LIORESAL) 10 MG tablet TAKE 1 TABLET BY MOUTH 3 TIMES DAILY. 270 tablet 3  . diltiazem (CARTIA XT) 240 MG 24 hr capsule Take 1 capsule (240 mg total) by mouth daily. 90 capsule 3  . labetalol (NORMODYNE) 200 MG tablet Take 1 tablet (200 mg total) by mouth 2 (two) times daily. 180 tablet 3  . losartan (COZAAR) 100 MG tablet Take 1 tablet (100 mg total) by mouth daily. 90 tablet 3  . Multiple Vitamin (MULTIVITAMIN) tablet Take 1 tablet by mouth daily.    . potassium chloride SA (K-DUR,KLOR-CON) 20 MEQ tablet Take 1 tablet (20 mEq total) by mouth daily. 30 tablet 3  . UNABLE TO FIND Repair to Dynamic Splint as needed. Dx code: 342.10 1 each 0   No current facility-administered medications for this visit.    Allergies  Allergen Reactions  . Penicillins   . Phenytoin Sodium Extended      Past Medical History  Diagnosis Date  . Stroke   . Hypertension     Past Surgical History  Procedure Laterality Date  . Hernia repair  2005  . Cataract extraction  2006    right eye    Social History   Social History  . Marital Status: Married    Spouse Name: N/A  . Number of Children: N/A  . Years of Education: N/A   Occupational History  . Not on file.   Social History Main Topics  . Smoking status: Never Smoker   . Smokeless tobacco: Never Used  . Alcohol Use: 0.0 oz/week    0 Standard drinks or equivalent per week     Comment:  Occasional  . Drug Use: Not on file  . Sexual Activity: Not on file   Other Topics Concern  . Not on file   Social History Narrative    Family History  Problem Relation Age of Onset  . Stroke Mother   . Heart disease Father     MI at age 1    ROS: no fevers or chills, productive cough, hemoptysis, dysphasia, odynophagia, melena, hematochezia, dysuria, hematuria, rash, seizure activity, orthopnea, PND, pedal edema, claudication. Remaining systems are negative.  Physical Exam:   Blood pressure 152/80, pulse 60, height 6' (1.829 m), weight 172 lb 1.6 oz (78.064 kg).  General:  Well developed/well nourished in NAD Skin warm/dry Patient not depressed No peripheral clubbing Back-normal HEENT-normal/normal eyelids Neck supple/normal carotid upstroke bilaterally; no bruits; no JVD; no thyromegaly chest - CTA/ normal expansion CV - RRR/normal S1 and S2; no rubs or gallops;  PMI nondisplaced; 2/6 systolic murmur left sternal border. Slight increase with Valsalva. Abdomen -NT/ND, no HSM, no mass, + bowel sounds, no bruit 2+ femoral pulses, no bruits Ext-no edema, chords, 2+ DP Neuro-residual left-sided weakness from previous CVA.  ECG 07/25/14-Sinus bradycardia, anterior and inferior T wave changes.

## 2014-09-02 ENCOUNTER — Ambulatory Visit (INDEPENDENT_AMBULATORY_CARE_PROVIDER_SITE_OTHER): Payer: Medicare Other | Admitting: Cardiology

## 2014-09-02 ENCOUNTER — Encounter: Payer: Self-pay | Admitting: Cardiology

## 2014-09-02 VITALS — BP 152/80 | HR 60 | Ht 72.0 in | Wt 172.1 lb

## 2014-09-02 DIAGNOSIS — R9431 Abnormal electrocardiogram [ECG] [EKG]: Secondary | ICD-10-CM | POA: Diagnosis not present

## 2014-09-02 DIAGNOSIS — I10 Essential (primary) hypertension: Secondary | ICD-10-CM | POA: Diagnosis not present

## 2014-09-02 LAB — BASIC METABOLIC PANEL
BUN: 17 mg/dL (ref 7–25)
CHLORIDE: 98 mmol/L (ref 98–110)
CO2: 33 mmol/L — AB (ref 20–31)
CREATININE: 1.05 mg/dL (ref 0.70–1.33)
Calcium: 9.6 mg/dL (ref 8.6–10.3)
GLUCOSE: 91 mg/dL (ref 65–99)
POTASSIUM: 2.8 mmol/L — AB (ref 3.5–5.3)
Sodium: 144 mmol/L (ref 135–146)

## 2014-09-02 MED ORDER — LABETALOL HCL 100 MG PO TABS: 100.0000 mg | ORAL_TABLET | Freq: Two times a day (BID) | ORAL | Status: DC

## 2014-09-02 NOTE — Patient Instructions (Addendum)
Your physician recommends that you schedule a follow-up appointment in: [place] has requested that you have an echocardiogram. Echocardiography is a painless test that uses sound waves to create images of your heart. It provides your doctor with information about the size and shape of your heart and how well your heart's chambers and valves are working. This procedure takes approximately one hour. There are no restrictions for this procedure.   INCREASE LABETALOL  TO 300 MG TWICE DAILY= 1 200 MG TABLET AND 1 100 MG TABLET TWICE DAILY  Your physician recommends that you HAVE LAB WORK TODAY

## 2014-09-02 NOTE — Assessment & Plan Note (Signed)
Blood pressure mildly elevated. I have asked him to follow this at home and he will bring his cuff with next office visit for correlation with ours. Increase labetalol to 300 mg twice a day. Further adjustments based on follow-up readings.

## 2014-09-02 NOTE — Assessment & Plan Note (Signed)
Patient is noted to have T-wave changes on his electrocardiogram. Previous echo possibly consistent with hypertrophic obstructive cardiomyopathy vs LVH from hypertension. Plan repeat echocardiogram. Note he is not having symptoms. No family history of sudden death. May consider MRI in the future for definitive evaluation.

## 2014-09-02 NOTE — Progress Notes (Signed)
Thanks for the followup!  

## 2014-09-03 ENCOUNTER — Telehealth: Payer: Self-pay | Admitting: Cardiology

## 2014-09-03 DIAGNOSIS — E876 Hypokalemia: Secondary | ICD-10-CM

## 2014-09-03 NOTE — Telephone Encounter (Signed)
Patient is returning a call from Debra.

## 2014-09-04 ENCOUNTER — Other Ambulatory Visit: Payer: Self-pay

## 2014-09-04 DIAGNOSIS — I10 Essential (primary) hypertension: Secondary | ICD-10-CM

## 2014-09-04 MED ORDER — LABETALOL HCL 100 MG PO TABS: 100.0000 mg | ORAL_TABLET | Freq: Two times a day (BID) | ORAL | Status: DC

## 2014-09-04 MED ORDER — POTASSIUM CHLORIDE CRYS ER 20 MEQ PO TBCR: 40.0000 meq | EXTENDED_RELEASE_TABLET | Freq: Every day | ORAL | Status: DC

## 2014-09-04 NOTE — Telephone Encounter (Signed)
Spoke with pt, aware of med change related to low potassium. New script sent to the pharmacy. Pt will have labs drawn at the [address] office sameday as echo

## 2014-09-10 ENCOUNTER — Ambulatory Visit (AMBULATORY_SURGERY_CENTER): Payer: Self-pay | Admitting: *Deleted

## 2014-09-10 ENCOUNTER — Other Ambulatory Visit (HOSPITAL_COMMUNITY): Payer: Self-pay

## 2014-09-10 ENCOUNTER — Telehealth: Payer: Self-pay | Admitting: *Deleted

## 2014-09-10 VITALS — Ht 72.0 in | Wt 172.0 lb

## 2014-09-10 DIAGNOSIS — Z1211 Encounter for screening for malignant neoplasm of colon: Secondary | ICD-10-CM

## 2014-09-10 NOTE — Progress Notes (Signed)
Patient denies any allergies to eggs or soy. Patient denies any problems with anesthesia/sedation. Patient denies any oxygen use at home and does not take any diet/weight loss medications. EMMI education assisgned to patient on colonoscopy, this was explained and instructions given to patient.  Sent phone note to John Nulty,CRNA. Pt for echo 09/11/14, for abnormal ECG.

## 2014-09-10 NOTE — Telephone Encounter (Signed)
Robbin,  He is not cleared for [place] until we see ECHO results  Thanks,  Jenny Reichmann

## 2014-09-10 NOTE — Telephone Encounter (Signed)
Patient had pre-visit done today for direct screening colonoscopy with Dr.Jacobs on 10/15/14. He had office visit with cardiologist on 09-02-14 for evaluation of abnormal ECG from PCP. Patient is to have ECHO on 09/11/14, his last Echo was 2011. Patient denies any heart symptoms/problems. Please follow up with the Echo results. Is patient okay for [facility]? Thank you,Harold Mattes PV.

## 2014-09-11 ENCOUNTER — Other Ambulatory Visit (INDEPENDENT_AMBULATORY_CARE_PROVIDER_SITE_OTHER): Payer: Medicare Other | Admitting: *Deleted

## 2014-09-11 ENCOUNTER — Ambulatory Visit (HOSPITAL_COMMUNITY): Payer: Medicare Other | Attending: Cardiology

## 2014-09-11 ENCOUNTER — Other Ambulatory Visit: Payer: Self-pay

## 2014-09-11 DIAGNOSIS — E785 Hyperlipidemia, unspecified: Secondary | ICD-10-CM | POA: Insufficient documentation

## 2014-09-11 DIAGNOSIS — I517 Cardiomegaly: Secondary | ICD-10-CM | POA: Diagnosis not present

## 2014-09-11 DIAGNOSIS — E876 Hypokalemia: Secondary | ICD-10-CM

## 2014-09-11 DIAGNOSIS — I059 Rheumatic mitral valve disease, unspecified: Secondary | ICD-10-CM | POA: Diagnosis not present

## 2014-09-11 DIAGNOSIS — I10 Essential (primary) hypertension: Secondary | ICD-10-CM

## 2014-09-11 LAB — BASIC METABOLIC PANEL
BUN: 20 mg/dL (ref 6–23)
CALCIUM: 9.8 mg/dL (ref 8.4–10.5)
CO2: 34 mEq/L — ABNORMAL HIGH (ref 19–32)
Chloride: 104 mEq/L (ref 96–112)
Creatinine, Ser: 1.08 mg/dL (ref 0.40–1.50)
GFR: 74.8 mL/min (ref >60.00)
GLUCOSE: 95 mg/dL (ref 70–99)
Potassium: 3.3 mEq/L — ABNORMAL LOW (ref 3.5–5.1)
SODIUM: 145 meq/L (ref 135–145)

## 2014-09-12 ENCOUNTER — Telehealth: Payer: Self-pay | Admitting: Cardiology

## 2014-09-12 DIAGNOSIS — Z79899 Other long term (current) drug therapy: Secondary | ICD-10-CM

## 2014-09-12 MED ORDER — SPIRONOLACTONE 25 MG PO TABS: 25.0000 mg | ORAL_TABLET | Freq: Every day | ORAL | Status: DC

## 2014-09-12 NOTE — Telephone Encounter (Signed)
Patient called and notified of results. Rx for spironolactone sent to pharmacy and bmet ordered. He will start new medication when he gets back from going out of town and have repeat labs 1 week after starting new med.

## 2014-09-12 NOTE — Telephone Encounter (Signed)
Hi John,  Results back for echo 09/11/14. [place] for [employer]? Please advise.  Thanks, Olivia Mackie

## 2014-09-12 NOTE — Telephone Encounter (Signed)
Returning call about he results of his Echo or labs . Please call   Thanks

## 2014-09-16 NOTE — Telephone Encounter (Signed)
Tracy,  This pt is cleared for anesthetic care at [facility]  Thanks,  Corlis Angelica 

## 2014-09-17 NOTE — Telephone Encounter (Signed)
Noted. Thanks.

## 2014-09-23 ENCOUNTER — Encounter: Payer: Self-pay | Admitting: Gastroenterology

## 2014-10-03 ENCOUNTER — Telehealth: Payer: Self-pay | Admitting: Cardiology

## 2014-10-03 ENCOUNTER — Telehealth: Payer: Self-pay | Admitting: Gastroenterology

## 2014-10-03 DIAGNOSIS — I10 Essential (primary) hypertension: Secondary | ICD-10-CM

## 2014-10-03 MED ORDER — LABETALOL HCL 200 MG PO TABS: 200.0000 mg | ORAL_TABLET | Freq: Two times a day (BID) | ORAL | Status: DC

## 2014-10-03 MED ORDER — LABETALOL HCL 100 MG PO TABS: 100.0000 mg | ORAL_TABLET | Freq: Two times a day (BID) | ORAL | Status: DC

## 2014-10-03 NOTE — Telephone Encounter (Signed)
Discussed with pt that this should not cause problems with the prep.

## 2014-10-03 NOTE — Telephone Encounter (Signed)
Spoke with pt, refill for the labetalol 100 mg and 200 mg tablets sent to [facility].

## 2014-10-03 NOTE — Telephone Encounter (Signed)
Please call,question about medication.

## 2014-10-15 ENCOUNTER — Encounter: Payer: Self-pay | Admitting: Gastroenterology

## 2014-10-15 ENCOUNTER — Ambulatory Visit (AMBULATORY_SURGERY_CENTER): Payer: Medicare Other | Admitting: Gastroenterology

## 2014-10-15 VITALS — BP 154/89 | HR 62 | Temp 97.4°F | Resp 18 | Ht 72.0 in | Wt 172.0 lb

## 2014-10-15 DIAGNOSIS — Z1211 Encounter for screening for malignant neoplasm of colon: Secondary | ICD-10-CM

## 2014-10-15 MED ORDER — SODIUM CHLORIDE 0.9 % IV SOLN: 500.0000 mL | INTRAVENOUS | Status: DC

## 2014-10-15 NOTE — Op Note (Signed)
[facility]  [address] [place], [id number]   COLONOSCOPY PROCEDURE REPORT  PATIENT: Steve Burton, Steve Burton  MR#: [id number] BIRTHDATE: 06-05-1955 , 73  yrs. old GENDER: male ENDOSCOPIST: Milus Banister, MD REFERRED HT:DSKA Lauenstein, M.D. PROCEDURE DATE:  10/15/2014 PROCEDURE:   Colonoscopy, screening First Screening Colonoscopy - Avg.  risk and is 50 yrs.  old or older Yes.  Prior Negative Screening - Now for repeat screening. N/A  History of Adenoma - Now for follow-up colonoscopy & has been > or = to 3 yrs.  N/A  Recommend repeat exam, <10 yrs? No ASA CLASS:   Class III INDICATIONS:Screening for colonic neoplasia and Colorectal Neoplasm Risk Assessment for this procedure is average risk. MEDICATIONS: Monitored anesthesia care and Propofol 400 mg IV  DESCRIPTION OF PROCEDURE:   After the risks benefits and alternatives of the procedure were thoroughly explained, informed consent was obtained.  The digital rectal exam revealed no abnormalities of the rectum.   The LB [id number]  endoscope was introduced through the anus and advanced to the cecum, which was identified by both the appendix and ileocecal valve. No adverse events experienced.   The quality of the prep was good.  The instrument was then slowly withdrawn as the colon was fully examined. Estimated blood loss is zero unless otherwise noted in this procedure report.    COLON FINDINGS: A normal appearing cecum, ileocecal valve, and appendiceal orifice were identified.  The ascending, transverse, descending, sigmoid colon, and rectum appeared unremarkable. Retroflexion was not performed due to a narrow rectal vault. The time to cecum = 8.1 Withdrawal time = 10.2   The scope was withdrawn and the procedure completed. COMPLICATIONS: There were no immediate complications.  ENDOSCOPIC IMPRESSION: Normal colonoscopy  RECOMMENDATIONS: You should continue to follow colorectal cancer screening  guidelines for "routine risk" patients with a repeat colonoscopy in 10 years.   eSigned:  Milus Banister, MD 10/15/2014 10:58 AM

## 2014-10-15 NOTE — Patient Instructions (Signed)
YOU HAD AN ENDOSCOPIC PROCEDURE TODAY AT [facility]:   Refer to the procedure report that was given to you for any specific questions about what was found during the examination.  If the procedure report does not answer your questions, please call your gastroenterologist to clarify.  If you requested that your care partner not be given the details of your procedure findings, then the procedure report has been included in a sealed envelope for you to review at your convenience later.  YOU SHOULD EXPECT: Some feelings of bloating in the abdomen. Passage of more gas than usual.  Walking can help get rid of the air that was put into your GI tract during the procedure and reduce the bloating. If you had a lower endoscopy (such as a colonoscopy or flexible sigmoidoscopy) you may notice spotting of blood in your stool or on the toilet paper. If you underwent a bowel prep for your procedure, you may not have a normal bowel movement for a few days.  Please Note:  You might notice some irritation and congestion in your nose or some drainage.  This is from the oxygen used during your procedure.  There is no need for concern and it should clear up in a day or so.  SYMPTOMS TO REPORT IMMEDIATELY:   Following lower endoscopy (colonoscopy or flexible sigmoidoscopy):  Excessive amounts of blood in the stool  Significant tenderness or worsening of abdominal pains  Swelling of the abdomen that is new, acute  Fever of 100F or higher  For urgent or emergent issues, a gastroenterologist can be reached at any hour by calling [phone].   DIET: Your first meal following the procedure should be a small meal and then it is ok to progress to your normal diet. Heavy or fried foods are harder to digest and may make you feel nauseous or bloated.  Likewise, meals heavy in dairy and vegetables can increase bloating.  Drink plenty of fluids but you should avoid alcoholic beverages for 24  hours.  ACTIVITY:  You should plan to take it easy for the rest of today and you should NOT DRIVE or use heavy machinery until tomorrow (because of the sedation medicines used during the test).    FOLLOW UP: Our staff will call the number listed on your records the next business day following your procedure to check on you and address any questions or concerns that you may have regarding the information given to you following your procedure. If we do not reach you, we will leave a message.  However, if you are feeling well and you are not experiencing any problems, there is no need to return our call.  We will assume that you have returned to your regular daily activities without incident.  If any biopsies were taken you will be contacted by phone or by letter within the next 1-3 weeks.  Please call us at [phone] if you have not heard about the biopsies in 3 weeks.    SIGNATURES/CONFIDENTIALITY: You and/or your care partner have signed paperwork which will be entered into your electronic medical record.  These signatures attest to the fact that that the information above on your After Visit Summary has been reviewed and is understood.  Full responsibility of the confidentiality of this discharge information lies with you and/or your care-partner.  Next colonoscopy in 10 years. Resume current medications.

## 2014-10-15 NOTE — Progress Notes (Signed)
Transferred to recovery room. A/O x3, pleased with MAC.  VSS.  Report to Wendy, RN. 

## 2014-10-16 ENCOUNTER — Telehealth: Payer: Self-pay | Admitting: *Deleted

## 2014-10-16 NOTE — Telephone Encounter (Signed)
No answer. Number identifier. Message left to call if questions or concerns. 

## 2014-11-26 NOTE — Progress Notes (Signed)
      HPI: FU abnormal ECG. Seen previously by Dr Domenic Polite for hypertension but not since 2008. Echo 4/11 showed hyperdynamic LV function, moderate LVH, intracavitary gradient of 4 m/s, grade 1 diastolic dysfunction, SAM with peak LVOT gradient of 60 mmHg. Carotid dopplers 4/11 showed no stenosis bilaterally. Recently noted to have an abnormal electrocardiogram. We were asked to evaluate. Echocardiogram repeated September 2016. There was normal LV function with moderate left ventricular hypertrophy. Mild to moderate left atrial enlargement. Since last seen, the patient denies any dyspnea, orthopnea, PND, pedal edema, palpitations, syncope or chest pain.   Current Outpatient Prescriptions  Medication Sig Dispense Refill  . baclofen (LIORESAL) 10 MG tablet TAKE 1 TABLET BY MOUTH 3 TIMES DAILY. 270 tablet 3  . diltiazem (CARTIA XT) 240 MG 24 hr capsule Take 1 capsule (240 mg total) by mouth daily. 90 capsule 3  . labetalol (NORMODYNE) 100 MG tablet Take 1 tablet (100 mg total) by mouth 2 (two) times daily. 180 tablet 3  . labetalol (NORMODYNE) 200 MG tablet Take 1 tablet (200 mg total) by mouth 2 (two) times daily. 180 tablet 3  . losartan (COZAAR) 100 MG tablet Take 1 tablet (100 mg total) by mouth daily. 90 tablet 3  . Multiple Vitamin (MULTIVITAMIN) tablet Take 1 tablet by mouth daily.    . potassium chloride SA (K-DUR,KLOR-CON) 20 MEQ tablet Take 2 tablets (40 mEq total) by mouth daily. (Patient taking differently: Take 40 mEq by mouth 2 (two) times daily. ) 180 tablet 3  . spironolactone (ALDACTONE) 25 MG tablet Take 1 tablet (25 mg total) by mouth daily. 90 tablet 3  . UNABLE TO FIND Repair to Dynamic Splint as needed. Dx code: 342.10 1 each 0   No current facility-administered medications for this visit.     Past Medical History  Diagnosis Date  . Hypertension   . Stroke [facility]) 2011    Past Surgical History  Procedure Laterality Date  . Hernia repair  2005  . Cataract extraction   2006    right eye    Social History   Social History  . Marital Status: Married    Spouse Name: N/A  . Number of Children: N/A  . Years of Education: N/A   Occupational History  . Not on file.   Social History Main Topics  . Smoking status: Never Smoker   . Smokeless tobacco: Never Used  . Alcohol Use: 0.0 oz/week    0 Standard drinks or equivalent per week     Comment: Occasional  . Drug Use: No  . Sexual Activity: Not on file   Other Topics Concern  . Not on file   Social History Narrative    ROS: no fevers or chills, productive cough, hemoptysis, dysphasia, odynophagia, melena, hematochezia, dysuria, hematuria, rash, seizure activity, orthopnea, PND, pedal edema, claudication. Remaining systems are negative.  Physical Exam: Well-developed well-nourished in no acute distress.  Skin is warm and dry.  HEENT is normal.  Neck is supple.  Chest is clear to auscultation with normal expansion.  Cardiovascular exam is regular rate and rhythm. 2/6 systolic murmur left sternal border with slight increase with Valsalva. Abdominal exam nontender or distended. No masses palpated. Extremities show no edema. Neuro Left hemiparesis  ECG Sinus rhythm at a rate of 60. Nonspecific ST changes.

## 2014-12-03 ENCOUNTER — Ambulatory Visit (INDEPENDENT_AMBULATORY_CARE_PROVIDER_SITE_OTHER): Payer: Medicare Other | Admitting: Cardiology

## 2014-12-03 ENCOUNTER — Encounter: Payer: Self-pay | Admitting: Cardiology

## 2014-12-03 VITALS — BP 126/80 | HR 64 | Ht 73.0 in | Wt 168.1 lb

## 2014-12-03 DIAGNOSIS — I421 Obstructive hypertrophic cardiomyopathy: Secondary | ICD-10-CM | POA: Diagnosis not present

## 2014-12-03 DIAGNOSIS — R9431 Abnormal electrocardiogram [ECG] [EKG]: Secondary | ICD-10-CM | POA: Diagnosis not present

## 2014-12-03 NOTE — Assessment & Plan Note (Signed)
Patient has a history Of hypertrophic obstructive cardiomyopathy on previous echocardiogram. Most recently this appears to be more left ventricular hypertrophy. I will arrange a cardiac MRI to fully exclude hypertrophic obstructive cardiomyopathy. If he has this he would need a monitor to screen for nonsustained ventricular tachycardia and follow-up echoes in the future. He does not have a family history of sudden death. He would also need to have his siblings screened. He has no children.

## 2014-12-03 NOTE — Assessment & Plan Note (Signed)
Blood pressure controlled. Continue present medications. Check potassium and renal function. 

## 2014-12-03 NOTE — Patient Instructions (Signed)
Medication Instructions:   NO CHANGE  Labwork:  Your physician recommends that you HAVE LAB WORK TODAY  Testing/Procedures:  Your physician has requested that you have a cardiac MRI. Cardiac MRI uses a computer to create images of your heart as its beating, producing both still and moving pictures of your heart and major blood vessels. For further information please visit [email or web address] Please follow the instruction sheet given to you today for more information.    Follow-Up:  Your physician wants you to follow-up in: [place] will receive a reminder letter in the mail two months in advance. If you don't receive a letter, please call our office to schedule the follow-up appointment.   If you need a refill on your cardiac medications before your next appointment, please call your pharmacy.

## 2014-12-04 ENCOUNTER — Encounter: Payer: Self-pay | Admitting: Cardiology

## 2014-12-04 LAB — BASIC METABOLIC PANEL
BUN: 21 mg/dL (ref 7–25)
CHLORIDE: 104 mmol/L (ref 98–110)
CO2: 29 mmol/L (ref 20–31)
CREATININE: 1.12 mg/dL (ref 0.70–1.33)
Calcium: 9.4 mg/dL (ref 8.6–10.3)
GLUCOSE: 113 mg/dL — AB (ref 65–99)
Potassium: 3.8 mmol/L (ref 3.5–5.3)
Sodium: 143 mmol/L (ref 135–146)

## 2014-12-16 ENCOUNTER — Telehealth: Payer: Self-pay | Admitting: Cardiology

## 2014-12-16 NOTE — Telephone Encounter (Signed)
Pt called in stating that he had some labs done on 11/30 and he would like to know if those results are available. Please f/u with him  Thanks

## 2014-12-16 NOTE — Telephone Encounter (Signed)
Patient aware of lab results.

## 2014-12-16 NOTE — Telephone Encounter (Signed)
Returning your call. °

## 2014-12-16 NOTE — Telephone Encounter (Signed)
Left message to call back. Per Dr. Stanford Breed, lab are okay from 12/03/2014.

## 2014-12-18 ENCOUNTER — Ambulatory Visit (HOSPITAL_COMMUNITY)
Admission: RE | Admit: 2014-12-18 | Discharge: 2014-12-18 | Disposition: A | Discharge status: Home / Self Care | Payer: Medicare Other | Source: Ambulatory Visit | Attending: Cardiology | Admitting: Cardiology

## 2014-12-18 DIAGNOSIS — I517 Cardiomegaly: Secondary | ICD-10-CM | POA: Insufficient documentation

## 2014-12-18 DIAGNOSIS — R9431 Abnormal electrocardiogram [ECG] [EKG]: Secondary | ICD-10-CM | POA: Insufficient documentation

## 2014-12-18 DIAGNOSIS — I421 Obstructive hypertrophic cardiomyopathy: Secondary | ICD-10-CM | POA: Diagnosis not present

## 2014-12-18 MED ORDER — GADOBENATE DIMEGLUMINE 529 MG/ML IV SOLN
26.0000 mL | Freq: Once | INTRAVENOUS | Status: AC | PRN
  Administered 2014-12-18: 26 mL via INTRAVENOUS

## 2014-12-26 ENCOUNTER — Encounter: Payer: Self-pay | Admitting: *Deleted

## 2015-01-15 ENCOUNTER — Ambulatory Visit (INDEPENDENT_AMBULATORY_CARE_PROVIDER_SITE_OTHER): Payer: Medicare Other | Admitting: Family Medicine

## 2015-01-15 VITALS — BP 122/88 | HR 72 | Temp 98.3°F | Resp 16 | Ht 73.0 in | Wt 180.0 lb

## 2015-01-15 DIAGNOSIS — E876 Hypokalemia: Secondary | ICD-10-CM

## 2015-01-15 DIAGNOSIS — H269 Unspecified cataract: Secondary | ICD-10-CM | POA: Diagnosis not present

## 2015-01-15 DIAGNOSIS — I63311 Cerebral infarction due to thrombosis of right middle cerebral artery: Secondary | ICD-10-CM | POA: Diagnosis not present

## 2015-01-15 DIAGNOSIS — I10 Essential (primary) hypertension: Secondary | ICD-10-CM | POA: Diagnosis not present

## 2015-01-15 NOTE — Progress Notes (Signed)
By signing my name below, I, Moises Blood, attest that this documentation has been prepared under the direction and in the presence of Robyn Haber, MD. Electronically Signed: Moises Blood, [place]. 01/15/2015 , 8:42 AM .  Patient was seen in room 14 .   Patient ID: Kinser Biese MRN: [id number], DOB: 1956/01/01, 60 y.o. Date of Encounter: 01/15/2015  Primary Physician: Robyn Haber, MD  Chief Complaint:  Chief Complaint  Patient presents with  . insurance said he needed to see his PCP    Brought in his paperwork his insurance sent him    HPI:  Steve Burton is a 60 y.o. male who presents to [facility] for insurance paperwork.  He had MRI done a month ago and still has a red mark over where he had the IV put in.   He has some slight abdominal discomfort taking spironolactone from Dr. Stanford Breed. His potassium levels are doing well.    He is planning to see ophthalmologist (Dr. Katy Fitch) for cataracts in his left eye. He was also hoping to see them if he'll be able to drive again due to his decreased peripheral vision on the left side.   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 He is able to walk with a cane. He's using a wheelchair for mobility in the office.   Past Medical History  Diagnosis Date  . Hypertension   . Stroke [facility]) 2011     Home Meds: Prior to Admission medications   Medication Sig Start Date End Date Taking? Authorizing Provider  baclofen (LIORESAL) 10 MG tablet TAKE 1 TABLET BY MOUTH 3 TIMES DAILY. 07/25/14   Robyn Haber, MD  diltiazem (CARTIA XT) 240 MG 24 hr capsule Take 1 capsule (240 mg total) by mouth daily. 07/25/14   Robyn Haber, MD  labetalol (NORMODYNE) 100 MG tablet Take 1 tablet (100 mg total) by mouth 2 (two) times daily. 10/03/14   Lelon Perla, MD  labetalol (NORMODYNE) 200 MG tablet Take 1 tablet (200 mg total) by mouth 2 (two) times daily. 10/03/14   Lelon Perla, MD    losartan (COZAAR) 100 MG tablet Take 1 tablet (100 mg total) by mouth daily. 07/25/14   Robyn Haber, MD  Multiple Vitamin (MULTIVITAMIN) tablet Take 1 tablet by mouth daily.    Historical Provider, MD  potassium chloride SA (K-DUR,KLOR-CON) 20 MEQ tablet Take 2 tablets (40 mEq total) by mouth daily. Patient taking differently: Take 40 mEq by mouth 2 (two) times daily.  09/04/14   Lelon Perla, MD  spironolactone (ALDACTONE) 25 MG tablet Take 1 tablet (25 mg total) by mouth daily. 09/12/14   Lelon Perla, MD  UNABLE TO FIND Repair to Dynamic Splint as needed. Dx code: 342.10 08/07/13   Collene Leyden, PA-C    Allergies:  Allergies  Allergen Reactions  . Phenytoin Sodium Extended Other (See Comments)    Arm swelling  . Penicillins Rash    Social History   Social History  . Marital Status: Married    Spouse Name: N/A  . Number of Children: N/A  . Years of Education: N/A   Occupational History  . Not on file.   Social History Main Topics  . Smoking status: Never Smoker   . Smokeless tobacco: Never Used  . Alcohol Use: 0.0 oz/week    0 Standard drinks or equivalent per week     Comment: Occasional  . Drug Use:  No  . Sexual Activity: Not on file   Other Topics Concern  . Not on file   Social History Narrative     Review of Systems: Constitutional: negative for fever, chills, night sweats, weight changes, or fatigue  HEENT: negative for vision changes, hearing loss, congestion, rhinorrhea, ST, epistaxis, or sinus pressure Cardiovascular: negative for chest pain or palpitations Respiratory: negative for hemoptysis, wheezing, shortness of breath, or cough Abdominal: negative for abdominal pain, nausea, vomiting, diarrhea, or constipation Dermatological: negative for rash Neurologic: negative for headache, dizziness, or syncope All other systems reviewed and are otherwise negative with the exception to those above and in the HPI.  Physical Exam: Blood pressure  122/88, pulse 72, temperature 98.3 F (36.8 C), temperature source Oral, resp. rate 16, height 6\' 1"  (1.854 m), weight 180 lb (81.647 kg), SpO2 98 %., Body mass index is 23.75 kg/(m^2). General: Well developed, well nourished, in no acute distress. Head: Normocephalic, atraumatic, eyes without discharge, sclera non-icteric, nares are without discharge. Bilateral auditory canals clear, TM's are without perforation, pearly grey and translucent with reflective cone of light bilaterally. Oral cavity moist, posterior pharynx without exudate, erythema, peritonsillar abscess, or post nasal drip. Cataract in left eye Neck: Supple. No thyromegaly. Full ROM. No lymphadenopathy. Lungs: Clear bilaterally to auscultation without wheezes, rales, or rhonchi. Breathing is unlabored. Heart: RRR with S1 S2. No murmurs, rubs, or gallops appreciated. Abdomen: Soft, non-tender, non-distended with normoactive bowel sounds. No hepatomegaly. No rebound/guarding. No obvious abdominal masses. Msk:  Strength and tone normal for age. Extremities/Skin: Warm and dry. No clubbing or cyanosis. No edema. No rashes or suspicious lesions. Neuro: Alert and oriented X 3. In a wheelchair for mobility, has a cane too Psych:  Responds to questions appropriately with a normal affect.   Labs:  ASSESSMENT AND PLAN:  60 y.o. year old male with This chart was scribed in my presence and reviewed by me personally.    ICD-9-CM ICD-10-CM   1. Essential hypertension 401.9 I10   2. Cerebrovascular accident (CVA) due to thrombosis of right middle cerebral artery ([place]) 434.01 I63.311 DME Bedside commode  3. Hypokalemia 276.8 E87.6   4. Cataract, left 366.9 H26.9      Signed, Robyn Haber, MD 01/15/2015 9:05 AM

## 2015-01-19 DIAGNOSIS — I63449 Cerebral infarction due to embolism of unspecified cerebellar artery: Secondary | ICD-10-CM | POA: Diagnosis not present

## 2015-01-19 DIAGNOSIS — M24542 Contracture, left hand: Secondary | ICD-10-CM | POA: Diagnosis not present

## 2015-01-19 DIAGNOSIS — I69352 Hemiplegia and hemiparesis following cerebral infarction affecting left dominant side: Secondary | ICD-10-CM | POA: Diagnosis not present

## 2015-02-18 DIAGNOSIS — I69352 Hemiplegia and hemiparesis following cerebral infarction affecting left dominant side: Secondary | ICD-10-CM | POA: Diagnosis not present

## 2015-02-18 DIAGNOSIS — M24542 Contracture, left hand: Secondary | ICD-10-CM | POA: Diagnosis not present

## 2015-02-18 DIAGNOSIS — I63449 Cerebral infarction due to embolism of unspecified cerebellar artery: Secondary | ICD-10-CM | POA: Diagnosis not present

## 2015-03-18 DIAGNOSIS — M24542 Contracture, left hand: Secondary | ICD-10-CM | POA: Diagnosis not present

## 2015-03-18 DIAGNOSIS — I69352 Hemiplegia and hemiparesis following cerebral infarction affecting left dominant side: Secondary | ICD-10-CM | POA: Diagnosis not present

## 2015-03-18 DIAGNOSIS — I63449 Cerebral infarction due to embolism of unspecified cerebellar artery: Secondary | ICD-10-CM | POA: Diagnosis not present

## 2015-03-23 ENCOUNTER — Telehealth: Payer: Self-pay | Admitting: Cardiology

## 2015-03-23 NOTE — Telephone Encounter (Signed)
New message     Pt c/o BP issue: STAT if pt c/o blurred vision, one-sided weakness or slurred speech  1. What are your last 5 BP readings? Today 148/90 , yesterday  139/80   2. Are you having any other symptoms (ex. Dizziness, headache, blurred vision, passed out)?  A week ago seems to be better now   3. What is your BP issue? Should medication be adjusted.

## 2015-03-23 NOTE — Telephone Encounter (Signed)
Increase labetalol to 200 mg BID and follow BP Kirk Ruths

## 2015-03-23 NOTE — Telephone Encounter (Signed)
Follow up ° ° ° ° ° °Returning a call to the nurse °

## 2015-03-23 NOTE — Telephone Encounter (Signed)
Left msg to call.

## 2015-03-23 NOTE — Telephone Encounter (Signed)
Returned call to patient who has concerns about his BP:  BP 148/90 this AM (1 hour after AM meds) BP 139/80 yesterday AM  Last week was 140s/92 Patient states BP is better in evening, any symptoms better by the evening  Patient states last week he had some dizziness for a few days - BP was higher  - hearing "cut back for a while", tingling in arm  Denies chest pain, shortness of breath  Informed patient I would send the message to Dr. Lyda Kalata to review and advise

## 2015-03-23 NOTE — Telephone Encounter (Signed)
LMTCB

## 2015-04-06 ENCOUNTER — Other Ambulatory Visit: Payer: Self-pay | Admitting: Family Medicine

## 2015-04-06 MED ORDER — FUROSEMIDE 20 MG PO TABS: 20.0000 mg | ORAL_TABLET | Freq: Two times a day (BID) | ORAL | Status: DC | PRN

## 2015-04-07 NOTE — Telephone Encounter (Signed)
Unable to reach pt or leave a message  

## 2015-04-13 ENCOUNTER — Other Ambulatory Visit: Payer: Self-pay | Admitting: Family Medicine

## 2015-04-22 ENCOUNTER — Other Ambulatory Visit: Payer: Self-pay | Admitting: Family Medicine

## 2015-04-23 ENCOUNTER — Other Ambulatory Visit: Payer: Self-pay | Admitting: Family Medicine

## 2015-04-23 DIAGNOSIS — I10 Essential (primary) hypertension: Secondary | ICD-10-CM

## 2015-04-23 MED ORDER — LOSARTAN POTASSIUM 100 MG PO TABS: 100.0000 mg | ORAL_TABLET | Freq: Every day | ORAL | Status: DC

## 2015-06-02 DIAGNOSIS — I63449 Cerebral infarction due to embolism of unspecified cerebellar artery: Secondary | ICD-10-CM | POA: Diagnosis not present

## 2015-06-02 DIAGNOSIS — I69352 Hemiplegia and hemiparesis following cerebral infarction affecting left dominant side: Secondary | ICD-10-CM | POA: Diagnosis not present

## 2015-06-02 DIAGNOSIS — M24542 Contracture, left hand: Secondary | ICD-10-CM | POA: Diagnosis not present

## 2015-06-09 ENCOUNTER — Other Ambulatory Visit: Payer: Self-pay | Admitting: Family Medicine

## 2015-06-09 ENCOUNTER — Other Ambulatory Visit: Payer: Self-pay | Admitting: Cardiology

## 2015-06-10 NOTE — Telephone Encounter (Signed)
Follow-up needed for additional refills.

## 2015-06-25 ENCOUNTER — Other Ambulatory Visit: Payer: Self-pay

## 2015-06-25 MED ORDER — DILTIAZEM HCL ER COATED BEADS 240 MG PO CP24: ORAL_CAPSULE | ORAL | Status: DC

## 2015-06-29 DIAGNOSIS — I69352 Hemiplegia and hemiparesis following cerebral infarction affecting left dominant side: Secondary | ICD-10-CM | POA: Diagnosis not present

## 2015-06-29 DIAGNOSIS — M24542 Contracture, left hand: Secondary | ICD-10-CM | POA: Diagnosis not present

## 2015-06-29 DIAGNOSIS — I63449 Cerebral infarction due to embolism of unspecified cerebellar artery: Secondary | ICD-10-CM | POA: Diagnosis not present

## 2015-06-30 DIAGNOSIS — H35411 Lattice degeneration of retina, right eye: Secondary | ICD-10-CM | POA: Diagnosis not present

## 2015-06-30 DIAGNOSIS — Z961 Presence of intraocular lens: Secondary | ICD-10-CM | POA: Diagnosis not present

## 2015-06-30 DIAGNOSIS — H4089 Other specified glaucoma: Secondary | ICD-10-CM | POA: Diagnosis not present

## 2015-06-30 DIAGNOSIS — H43813 Vitreous degeneration, bilateral: Secondary | ICD-10-CM | POA: Diagnosis not present

## 2015-06-30 DIAGNOSIS — H2512 Age-related nuclear cataract, left eye: Secondary | ICD-10-CM | POA: Diagnosis not present

## 2015-07-23 DIAGNOSIS — H2512 Age-related nuclear cataract, left eye: Secondary | ICD-10-CM | POA: Diagnosis not present

## 2015-08-04 DIAGNOSIS — I69352 Hemiplegia and hemiparesis following cerebral infarction affecting left dominant side: Secondary | ICD-10-CM | POA: Diagnosis not present

## 2015-08-04 DIAGNOSIS — M24542 Contracture, left hand: Secondary | ICD-10-CM | POA: Diagnosis not present

## 2015-08-04 DIAGNOSIS — I63449 Cerebral infarction due to embolism of unspecified cerebellar artery: Secondary | ICD-10-CM | POA: Diagnosis not present

## 2015-08-11 ENCOUNTER — Other Ambulatory Visit: Payer: Self-pay | Admitting: Cardiology

## 2015-08-11 DIAGNOSIS — I10 Essential (primary) hypertension: Secondary | ICD-10-CM

## 2015-08-11 DIAGNOSIS — E876 Hypokalemia: Secondary | ICD-10-CM

## 2015-08-12 NOTE — Telephone Encounter (Signed)
Rx request sent to pharmacy.  

## 2015-08-13 ENCOUNTER — Other Ambulatory Visit: Payer: Self-pay | Admitting: Family Medicine

## 2015-08-13 MED ORDER — BACLOFEN 10 MG PO TABS: 10.0000 mg | ORAL_TABLET | Freq: Three times a day (TID) | ORAL | 0 refills | Status: DC

## 2015-08-31 DIAGNOSIS — M24542 Contracture, left hand: Secondary | ICD-10-CM | POA: Diagnosis not present

## 2015-08-31 DIAGNOSIS — I63449 Cerebral infarction due to embolism of unspecified cerebellar artery: Secondary | ICD-10-CM | POA: Diagnosis not present

## 2015-08-31 DIAGNOSIS — I69352 Hemiplegia and hemiparesis following cerebral infarction affecting left dominant side: Secondary | ICD-10-CM | POA: Diagnosis not present

## 2015-09-30 DIAGNOSIS — M24542 Contracture, left hand: Secondary | ICD-10-CM | POA: Diagnosis not present

## 2015-09-30 DIAGNOSIS — I63449 Cerebral infarction due to embolism of unspecified cerebellar artery: Secondary | ICD-10-CM | POA: Diagnosis not present

## 2015-09-30 DIAGNOSIS — I69352 Hemiplegia and hemiparesis following cerebral infarction affecting left dominant side: Secondary | ICD-10-CM | POA: Diagnosis not present

## 2015-10-28 DIAGNOSIS — I69352 Hemiplegia and hemiparesis following cerebral infarction affecting left dominant side: Secondary | ICD-10-CM | POA: Diagnosis not present

## 2015-10-28 DIAGNOSIS — I63449 Cerebral infarction due to embolism of unspecified cerebellar artery: Secondary | ICD-10-CM | POA: Diagnosis not present

## 2015-10-28 DIAGNOSIS — M24542 Contracture, left hand: Secondary | ICD-10-CM | POA: Diagnosis not present

## 2015-11-01 ENCOUNTER — Other Ambulatory Visit: Payer: Self-pay | Admitting: Family Medicine

## 2015-11-04 ENCOUNTER — Other Ambulatory Visit: Payer: Self-pay | Admitting: Family Medicine

## 2015-11-04 DIAGNOSIS — I10 Essential (primary) hypertension: Secondary | ICD-10-CM

## 2015-11-04 MED ORDER — DILTIAZEM HCL ER COATED BEADS 240 MG PO CP24: 240.0000 mg | ORAL_CAPSULE | Freq: Every day | ORAL | 3 refills | Status: DC

## 2015-11-05 ENCOUNTER — Encounter: Payer: Self-pay | Admitting: Cardiology

## 2015-11-19 ENCOUNTER — Encounter: Payer: Self-pay | Admitting: Family Medicine

## 2015-11-19 ENCOUNTER — Ambulatory Visit (INDEPENDENT_AMBULATORY_CARE_PROVIDER_SITE_OTHER): Payer: Medicare Other | Admitting: Family Medicine

## 2015-11-19 VITALS — BP 142/82 | HR 61 | Ht 72.0 in | Wt 171.2 lb

## 2015-11-19 DIAGNOSIS — Z1159 Encounter for screening for other viral diseases: Secondary | ICD-10-CM | POA: Diagnosis not present

## 2015-11-19 DIAGNOSIS — G8114 Spastic hemiplegia affecting left nondominant side: Secondary | ICD-10-CM | POA: Diagnosis not present

## 2015-11-19 DIAGNOSIS — I10 Essential (primary) hypertension: Secondary | ICD-10-CM

## 2015-11-19 DIAGNOSIS — I6930 Unspecified sequelae of cerebral infarction: Secondary | ICD-10-CM | POA: Diagnosis not present

## 2015-11-19 DIAGNOSIS — Z0000 Encounter for general adult medical examination without abnormal findings: Secondary | ICD-10-CM | POA: Diagnosis not present

## 2015-11-19 DIAGNOSIS — Z125 Encounter for screening for malignant neoplasm of prostate: Secondary | ICD-10-CM

## 2015-11-19 LAB — POCT URINALYSIS DIPSTICK
Bilirubin, UA: NEGATIVE
GLUCOSE UA: NEGATIVE
Ketones, UA: NEGATIVE
LEUKOCYTES UA: NEGATIVE
NITRITE UA: NEGATIVE
RBC UA: NEGATIVE
Spec Grav, UA: 1.02
UROBILINOGEN UA: NEGATIVE
pH, UA: 6

## 2015-11-19 LAB — HEMOCCULT GUIAC POC 1CARD (OFFICE): Fecal Occult Blood, POC: NEGATIVE

## 2015-11-19 NOTE — Progress Notes (Signed)
Subjective:    Patient ID: Steve Burton, male    DOB: 25-Dec-1955, 60 y.o.   MRN: [id number]  HPI Chief Complaint  Patient presents with  . other    new pt get established. going out of town for 2 months   He is new to the practice and here for a complete physical exam. The patient rides [employer] to his appointment. He is in a wheelchair but states he can also walk with a cane.   States he is going out of town for the next 2 months and needs a physical today due to insurance purposes.  No concerns or complaints today.   Previous medical care: Dr. Joseph Art for several years and states he is retiring. Patient has not been seen by his PCP since January. States he has been taking his medications daily however and does not skip doses.  Last CPE: 2016  Other providers: Dr. Stanford Breed - Cardiologist. Last saw him a year ago and is scheduled to see him in January.   Past medical history: HTN. - diagnosed in approximately 2010.  CVA March 2011.  Sees a PT once monthly to work on left sided strength.  Left sided hemiparesis.  Surgeries: hernia repair. Cataracts.   Social history: Lives with wife, Masters degree in Music. Disabled.   Diet: vegetarian.  Exercise: PT once monthly but he and his wife do PT nightly at home.   Immunizations: flu shot- refused. Td in 2016. Does not want a pneumonia shot.   Denies having a Hep C one time test.   Health maintenance:  Colonoscopy: 10/2014 Last PSA: years.  Last Dental Exam: twice annually.  Last Eye Exam: Dr. Katy Fitch. Last year.   Wears seatbelt always, uses sunscreen, smoke detectors in home and functioning,  feels safe in home environment.  Reviewed allergies, medications, past medical, surgical, family, and social history.    Review of Systems Review of Systems Constitutional: -fever, -chills, -sweats, -unexpected weight change,-fatigue ENT: -runny nose, -ear pain, -sore throat Cardiology:  -chest pain, -palpitations,  -edema Respiratory: -cough, -shortness of breath, -wheezing Gastroenterology: -abdominal pain, -nausea, -vomiting, -diarrhea, -constipation  Hematology: -bleeding or bruising problems Musculoskeletal: -arthralgias, -myalgias, -joint swelling, -back pain Ophthalmology: -vision changes Urology: -dysuria, -difficulty urinating, -hematuria, -urinary frequency, -urgency Neurology: -headache, -weakness, -tingling, -numbness        Objective:   Physical Exam BP (!) 142/82   Pulse 61   Ht 6' (1.829 m)   Wt 171 lb 3.2 oz (77.7 kg)   BMI 23.22 kg/m   General Appearance:    Alert, cooperative, no distress, appears stated age  Head:    Normocephalic, without obvious abnormality, atraumatic  Eyes:    PERRL, conjunctiva/corneas clear, EOM's intact, fundi    benign  Ears:    Normal TM's and external ear canals  Nose:   Nares normal, mucosa normal, no drainage or sinus   tenderness  Throat:   Lips, mucosa, and tongue normal; teeth and gums normal  Neck:   Supple, no lymphadenopathy;  thyroid:  no   enlargement/tenderness/nodules; no carotid   bruit or JVD  Back:    Spine nontender, no curvature, ROM normal, no CVA     tenderness  Lungs:     Clear to auscultation bilaterally without wheezes, rales or     ronchi; respirations unlabored  Chest Wall:    No tenderness or deformity   Heart:    Regular rate and rhythm, S1 and S2 normal, no murmur, rub   or  gallop  Breast Exam:    No chest wall tenderness, masses or gynecomastia  Abdomen:     Soft, non-tender, nondistended, normoactive bowel sounds,    no masses, no hepatosplenomegaly  Genitalia:    Refused.   Rectal:    Normal sphincter tone, no masses or tenderness; guaiac negative stool.  Prostate smooth, no nodules, not enlarged.  Extremities:   No clubbing, cyanosis or edema. Left sided spastic hemiparesis  Pulses:   2+ and symmetric all extremities  Skin:   Skin color, texture, turgor normal, no rashes or lesions  Lymph nodes:   Cervical,  supraclavicular, and axillary nodes normal  Neurologic:   CNII-XII intact, no sensation or voluntary movement to LUE and LLE, normal left sided strength and sensation.  reflexes 2+ on right side, foot drop to left foot.           Psych:   Normal mood, affect, hygiene and grooming.    Urinalysis dipstick: negative      Assessment & Plan:  Routine general medical examination at a health care facility - Plan: CBC with Differential/Platelet, Comprehensive metabolic panel, TSH, POCT urinalysis dipstick, Lipid panel, Hemoccult - 1 Card (office)  Essential hypertension - Plan: CBC with Differential/Platelet, Comprehensive metabolic panel  Left spastic hemiparesis (HCC)  History of CVA with residual deficit  Screening for prostate cancer - Plan: PSA  Need for hepatitis C screening test - Plan: Hepatitis C antibody  Overall he appears to be doing well and has a supportive spouse. He is aware that he needs to keep his blood pressure under good control. He will continue on current medications and watch his diet. Discussed DASH diet. He reports being stable on his current medication regimen for years.  Has an appointment with his cardiologist in January when he returns from [place].  He is doing PT and trying to keep his LUE and LLE flexible.  Discussed immunizations and he refuses pneumonia and flu shots.  He is up to date with colonoscopy.  Will test for hep c due to age group.  He is not fasting. He will return for labs tomorrow. Orders in the chart.  Follow up pending labs.

## 2015-11-19 NOTE — Patient Instructions (Addendum)
Return tomorrow fasting which means nothing to eat or drink except water for 6 hours. Lab orders are in the computer. Follow-up in 3 months or as needed.  Preventative Care for Adults, Male       REGULAR HEALTH EXAMS:  A routine yearly physical is a good way to check in with your primary care provider about your health and preventive screening. It is also an opportunity to share updates about your health and any concerns you have, and receive a thorough all-over exam.   Most health insurance companies pay for at least some preventative services.  Check with your health plan for specific coverages.  WHAT PREVENTATIVE SERVICES DO MEN NEED?  Adult men should have their weight and blood pressure checked regularly.   Men age 65 and older should have their cholesterol levels checked regularly.  Beginning at age 73 and continuing to age 37, men should be screened for colorectal cancer.  Certain people should may need continued testing until age 10.  Other cancer screening may include exams for testicular and prostate cancer.  Updating vaccinations is part of preventative care.  Vaccinations help protect against diseases such as the flu.  Lab tests are generally done as part of preventative care to screen for anemia and blood disorders, to screen for problems with the kidneys and liver, to screen for bladder problems, to check blood sugar, and to check your cholesterol level.  Preventative services generally include counseling about diet, exercise, avoiding tobacco, drugs, excessive alcohol consumption, and sexually transmitted infections.    GENERAL RECOMMENDATIONS FOR GOOD HEALTH:  Healthy diet:  Eat a variety of foods, including fruit, vegetables, animal or vegetable protein, such as meat, fish, chicken, and eggs, or beans, lentils, tofu, and grains, such as rice.  Drink plenty of water daily.  Decrease saturated fat in the diet, avoid lots of red meat, processed foods, sweets, fast  foods, and fried foods.  Exercise:  Aerobic exercise helps maintain good heart health. At least 30-40 minutes of moderate-intensity exercise is recommended. For example, a brisk walk that increases your heart rate and breathing. This should be done on most days of the week.   Find a type of exercise or a variety of exercises that you enjoy so that it becomes a part of your daily life.  Examples are running, walking, swimming, water aerobics, and biking.  For motivation and support, explore group exercise such as aerobic class, spin class, Zumba, Yoga,or  martial arts, etc.    Set exercise goals for yourself, such as a certain weight goal, walk or run in a race such as a 5k walk/run.  Speak to your primary care provider about exercise goals.  Disease prevention:  If you smoke or chew tobacco, find out from your caregiver how to quit. It can literally save your life, no matter how long you have been a tobacco user. If you do not use tobacco, never begin.   Maintain a healthy diet and normal weight. Increased weight leads to problems with blood pressure and diabetes.   The Body Mass Index or BMI is a way of measuring how much of your body is fat. Having a BMI above 27 increases the risk of heart disease, diabetes, hypertension, stroke and other problems related to obesity. Your caregiver can help determine your BMI and based on it develop an exercise and dietary program to help you achieve or maintain this important measurement at a healthful level.  High blood pressure causes heart and blood vessel  problems.  Persistent high blood pressure should be treated with medicine if weight loss and exercise do not work.   Fat and cholesterol leaves deposits in your arteries that can block them. This causes heart disease and vessel disease elsewhere in your body.  If your cholesterol is found to be high, or if you have heart disease or certain other medical conditions, then you may need to have your  cholesterol monitored frequently and be treated with medication.   Ask if you should have a stress test if your history suggests this. A stress test is a test done on a treadmill that looks for heart disease. This test can find disease prior to there being a problem.  Avoid drinking alcohol in excess (more than two drinks per day).  Avoid use of street drugs. Do not share needles with anyone. Ask for professional help if you need assistance or instructions on stopping the use of alcohol, cigarettes, and/or drugs.  Brush your teeth twice a day with fluoride toothpaste, and floss once a day. Good oral hygiene prevents tooth decay and gum disease. The problems can be painful, unattractive, and can cause other health problems. Visit your dentist for a routine oral and dental check up and preventive care every 6-12 months.   Look at your skin regularly.  Use a mirror to look at your back. Notify your caregivers of changes in moles, especially if there are changes in shapes, colors, a size larger than a pencil eraser, an irregular border, or development of new moles.  Safety:  Use seatbelts 100% of the time, whether driving or as a passenger.  Use safety devices such as hearing protection if you work in environments with loud noise or significant background noise.  Use safety glasses when doing any work that could send debris in to the eyes.  Use a helmet if you ride a bike or motorcycle.  Use appropriate safety gear for contact sports.  Talk to your caregiver about gun safety.  Use sunscreen with a SPF (or skin protection factor) of 15 or greater.  Lighter skinned people are at a greater risk of skin cancer. Don't forget to also wear sunglasses in order to protect your eyes from too much damaging sunlight. Damaging sunlight can accelerate cataract formation.   Practice safe sex. Use condoms. Condoms are used for birth control and to help reduce the spread of sexually transmitted infections (or STIs).   Some of the STIs are gonorrhea (the clap), chlamydia, syphilis, trichomonas, herpes, HPV (human papilloma virus) and HIV (human immunodeficiency virus) which causes AIDS. The herpes, HIV and HPV are viral illnesses that have no cure. These can result in disability, cancer and death.   Keep carbon monoxide and smoke detectors in your home functioning at all times. Change the batteries every 6 months or use a model that plugs into the wall.   Vaccinations:  Stay up to date with your tetanus shots and other required immunizations. You should have a booster for tetanus every 10 years. Be sure to get your flu shot every year, since 5%-20% of the [place] population comes down with the flu. The flu vaccine changes each year, so being vaccinated once is not enough. Get your shot in the fall, before the flu season peaks.   Other vaccines to consider:  Pneumococcal vaccine to protect against certain types of pneumonia.  This is normally recommended for adults age 52 or older.  However, adults younger than 58 years old with certain underlying conditions  such as diabetes, heart or lung disease should also receive the vaccine.  Shingles vaccine to protect against Varicella Zoster if you are older than age 72, or younger than 58 years old with certain underlying illness.  Hepatitis A vaccine to protect against a form of infection of the liver by a virus acquired from food.  Hepatitis B vaccine to protect against a form of infection of the liver by a virus acquired from blood or body fluids, particularly if you work in health care.  If you plan to travel internationally, check with your local health department for specific vaccination recommendations.  Cancer Screening:  Most routine colon cancer screening begins at the age of 60. On a yearly basis, doctors may provide special easy to use take-home tests to check for hidden blood in the stool. Sigmoidoscopy or colonoscopy can detect the earliest forms of colon  cancer and is life saving. These tests use a small camera at the end of a tube to directly examine the colon. Speak to your caregiver about this at age 41, when routine screening begins (and is repeated every 5 years unless early forms of pre-cancerous polyps or small growths are found).   At the age of 72 men usually start screening for prostate cancer every year. Screening may begin at a younger age for those with higher risk. Those at higher risk include African-Americans or having a family history of prostate cancer. There are two types of tests for prostate cancer:   Prostate-specific antigen (PSA) testing. Recent studies raise questions about prostate cancer using PSA and you should discuss this with your caregiver.   Digital rectal exam (in which your doctor's lubricated and gloved finger feels for enlargement of the prostate through the anus).   Screening for testicular cancer.  Do a monthly exam of your testicles. Gently roll each testicle between your thumb and fingers, feeling for any abnormal lumps. The best time to do this is after a hot shower or bath when the tissues are looser. Notify your caregivers of any lumps, tenderness or changes in size or shape immediately.

## 2015-11-20 ENCOUNTER — Ambulatory Visit: Payer: Medicare Other | Admitting: Cardiology

## 2015-11-20 ENCOUNTER — Other Ambulatory Visit: Payer: Medicare Other

## 2015-11-20 ENCOUNTER — Other Ambulatory Visit: Payer: Self-pay | Admitting: Family Medicine

## 2015-11-20 DIAGNOSIS — I10 Essential (primary) hypertension: Secondary | ICD-10-CM | POA: Diagnosis not present

## 2015-11-20 DIAGNOSIS — Z125 Encounter for screening for malignant neoplasm of prostate: Secondary | ICD-10-CM

## 2015-11-20 DIAGNOSIS — Z0000 Encounter for general adult medical examination without abnormal findings: Secondary | ICD-10-CM | POA: Diagnosis not present

## 2015-11-20 DIAGNOSIS — Z1159 Encounter for screening for other viral diseases: Secondary | ICD-10-CM | POA: Diagnosis not present

## 2015-11-20 DIAGNOSIS — R7309 Other abnormal glucose: Secondary | ICD-10-CM | POA: Diagnosis not present

## 2015-11-20 LAB — CBC WITH DIFFERENTIAL/PLATELET
BASOS ABS: 74 {cells}/uL (ref 0–200)
Basophils Relative: 1 %
EOS ABS: 370 {cells}/uL (ref 15–500)
Eosinophils Relative: 5 %
HEMATOCRIT: 39 % (ref 38.5–50.0)
Hemoglobin: 12.8 g/dL — ABNORMAL LOW (ref 13.2–17.1)
LYMPHS ABS: 1702 {cells}/uL (ref 850–3900)
LYMPHS PCT: 23 %
MCH: 29.2 pg (ref 27.0–33.0)
MCHC: 32.8 g/dL (ref 32.0–36.0)
MCV: 88.8 fL (ref 80.0–100.0)
MPV: 11.2 fL (ref 7.5–12.5)
Monocytes Absolute: 592 cells/uL (ref 200–950)
Monocytes Relative: 8 %
NEUTROS ABS: 4662 {cells}/uL (ref 1500–7800)
NEUTROS PCT: 63 %
Platelets: 220 10*3/uL (ref 140–400)
RBC: 4.39 MIL/uL (ref 4.20–5.80)
RDW: 13.5 % (ref 11.0–15.0)
WBC: 7.4 10*3/uL (ref 4.0–10.5)

## 2015-11-20 LAB — COMPREHENSIVE METABOLIC PANEL
ALT: 20 U/L (ref 9–46)
AST: 15 U/L (ref 10–35)
Albumin: 4.3 g/dL (ref 3.6–5.1)
Alkaline Phosphatase: 71 U/L (ref 40–115)
BUN: 16 mg/dL (ref 7–25)
CALCIUM: 9.9 mg/dL (ref 8.6–10.3)
CO2: 27 mmol/L (ref 20–31)
Chloride: 105 mmol/L (ref 98–110)
Creat: 1.05 mg/dL (ref 0.70–1.33)
GLUCOSE: 119 mg/dL — AB (ref 65–99)
POTASSIUM: 4.3 mmol/L (ref 3.5–5.3)
Sodium: 140 mmol/L (ref 135–146)
Total Bilirubin: 0.5 mg/dL (ref 0.2–1.2)
Total Protein: 6.8 g/dL (ref 6.1–8.1)

## 2015-11-20 LAB — PSA: PSA: 0.9 ng/mL (ref <=4.0)

## 2015-11-20 LAB — TSH: TSH: 1.9 m[IU]/L (ref 0.40–4.50)

## 2015-11-20 LAB — LIPID PANEL
CHOL/HDL RATIO: 4.5 ratio (ref <5.0)
CHOLESTEROL: 224 mg/dL — AB (ref <200)
HDL: 50 mg/dL (ref >40)
LDL Cholesterol: 151 mg/dL — ABNORMAL HIGH (ref <100)
TRIGLYCERIDES: 115 mg/dL (ref <150)
VLDL: 23 mg/dL (ref <30)

## 2015-11-21 LAB — HEPATITIS C ANTIBODY: HCV Ab: NEGATIVE

## 2015-11-24 LAB — HEMOGLOBIN A1C
HEMOGLOBIN A1C: 5.3 % (ref <5.7)
MEAN PLASMA GLUCOSE: 105 mg/dL

## 2015-12-13 ENCOUNTER — Other Ambulatory Visit: Payer: Self-pay | Admitting: Cardiology

## 2015-12-14 NOTE — Telephone Encounter (Signed)
Rx(s) sent to pharmacy electronically.  

## 2016-01-14 NOTE — Progress Notes (Signed)
HPI: FU HOCM. Seen previously by Dr Domenic Polite for hypertension. Echo 4/11 showed hyperdynamic LV function, moderate LVH, intracavitary gradient of 4 m/s, grade 1 diastolic dysfunction, SAM with peak LVOT gradient of 60 mmHg. Carotid dopplers 4/11 showed no stenosis bilaterally. Echocardiogram repeated September 2016. There was normal LV function with moderate left ventricular hypertrophy. Mild to moderate left atrial enlargement. Cardiac MRI December 2016 showed normal LV function, focal basal septal hypertrophy, no SAM and hypertrophic cardiomyopathy could not be excluded but findings also could be consistent with hypertension. Since last seen, patient denies dyspnea, chest pain, palpitations or syncope.  Current Outpatient Prescriptions  Medication Sig Dispense Refill  . baclofen (LIORESAL) 10 MG tablet Take 1 tablet (10 mg total) by mouth 3 (three) times daily. 90 tablet 0  . diltiazem (CARDIZEM CD) 240 MG 24 hr capsule Take 1 capsule (240 mg total) by mouth daily. 90 capsule 3  . labetalol (NORMODYNE) 100 MG tablet Take 1 tablet by mouth two  times daily 180 tablet 1  . labetalol (NORMODYNE) 200 MG tablet Take 1 tablet (200 mg total) by mouth 2 (two) times daily. <PLEASE MAKE APPOINTMENT FOR REFILLS> 90 tablet 0  . losartan (COZAAR) 100 MG tablet Take 1 tablet (100 mg total) by mouth daily. 90 tablet 3  . Multiple Vitamin (MULTIVITAMIN) tablet Take 1 tablet by mouth daily.    . potassium chloride SA (K-DUR,KLOR-CON) 20 MEQ tablet Take 2 tablets by mouth  daily 180 tablet 1  . spironolactone (ALDACTONE) 25 MG tablet TAKE 1 TABLET BY MOUTH  DAILY 90 tablet 0   No current facility-administered medications for this visit.      Past Medical History:  Diagnosis Date  . Hypertension   . Stroke [facility]) 2011    Past Surgical History:  Procedure Laterality Date  . CATARACT EXTRACTION  2006   right eye  . HERNIA REPAIR  2005    Social History   Social History  . Marital status:  Married    Spouse name: N/A  . Number of children: N/A  . Years of education: N/A   Occupational History  . Not on file.   Social History Main Topics  . Smoking status: Never Smoker  . Smokeless tobacco: Never Used  . Alcohol use 0.0 oz/week     Comment: 1 glass of wine per week.   . Drug use: No  . Sexual activity: Not on file   Other Topics Concern  . Not on file   Social History Narrative  . No narrative on file    Family History  Problem Relation Age of Onset  . Stroke Mother   . Heart disease Father     MI at age 41  . Epilepsy Sister   . Colon cancer Neg Hx     ROS: no fevers or chills, productive cough, hemoptysis, dysphasia, odynophagia, melena, hematochezia, dysuria, hematuria, rash, seizure activity, orthopnea, PND, pedal edema, claudication. Remaining systems are negative.  Physical Exam: Well-developed well-nourished in no acute distress.  Skin is warm and dry.  HEENT is normal.  Neck is supple.  Chest is clear to auscultation with normal expansion.  Cardiovascular exam is regular rate and rhythm.  Abdominal exam nontender or distended. No masses palpated. Extremities show no edema. neuro residual left side weakness  ECG-Sinus rhythm at a rate of 66. No ST changes.  A/P  1 Hypertrophic cardiomyopathy-patient felt to have hypertrophic cardiomyopathy from previous echocardiogram. However cardiac MRI performed previously suggested this  could also be seen with hypertension but hypertrophic cardiomyopathy cannot be excluded. ECG normal. We will plan to repeat his echocardiogram. LVH likely from previous hypertension.  2 hypertension-blood pressure controlled. Continue present medications. K and renal function monitored by primary care.  Kirk Ruths, MD

## 2016-01-17 ENCOUNTER — Other Ambulatory Visit: Payer: Self-pay | Admitting: Cardiology

## 2016-01-18 NOTE — Telephone Encounter (Signed)
Rx(s) sent to pharmacy electronically.  

## 2016-01-21 ENCOUNTER — Institutional Professional Consult (permissible substitution): Payer: Medicare Other | Admitting: Family Medicine

## 2016-01-25 ENCOUNTER — Ambulatory Visit (INDEPENDENT_AMBULATORY_CARE_PROVIDER_SITE_OTHER): Payer: Medicare Other | Admitting: Cardiology

## 2016-01-25 ENCOUNTER — Encounter: Payer: Self-pay | Admitting: Cardiology

## 2016-01-25 VITALS — BP 134/88 | HR 66 | Ht 72.0 in | Wt 184.0 lb

## 2016-01-25 DIAGNOSIS — I10 Essential (primary) hypertension: Secondary | ICD-10-CM

## 2016-01-25 DIAGNOSIS — I517 Cardiomegaly: Secondary | ICD-10-CM

## 2016-01-25 NOTE — Patient Instructions (Signed)

## 2016-02-01 ENCOUNTER — Institutional Professional Consult (permissible substitution): Payer: Medicare Other | Admitting: Family Medicine

## 2016-02-08 ENCOUNTER — Other Ambulatory Visit (HOSPITAL_COMMUNITY): Payer: Medicare Other

## 2016-02-23 ENCOUNTER — Encounter: Payer: Self-pay | Admitting: Family Medicine

## 2016-02-23 ENCOUNTER — Ambulatory Visit (INDEPENDENT_AMBULATORY_CARE_PROVIDER_SITE_OTHER): Payer: Medicare Other | Admitting: Family Medicine

## 2016-02-23 VITALS — BP 130/80 | HR 65 | Wt 172.0 lb

## 2016-02-23 DIAGNOSIS — I6930 Unspecified sequelae of cerebral infarction: Secondary | ICD-10-CM | POA: Diagnosis not present

## 2016-02-23 DIAGNOSIS — E7800 Pure hypercholesterolemia, unspecified: Secondary | ICD-10-CM

## 2016-02-23 DIAGNOSIS — G8114 Spastic hemiplegia affecting left nondominant side: Secondary | ICD-10-CM | POA: Diagnosis not present

## 2016-02-23 DIAGNOSIS — Z0289 Encounter for other administrative examinations: Secondary | ICD-10-CM | POA: Diagnosis not present

## 2016-02-23 DIAGNOSIS — I10 Essential (primary) hypertension: Secondary | ICD-10-CM | POA: Diagnosis not present

## 2016-02-23 NOTE — Progress Notes (Signed)
   Subjective:    Patient ID: Steve Burton, male    DOB: 1955-10-17, 61 y.o.   MRN: [id number]  HPI Chief Complaint  Patient presents with  . follow-up    follow-up and form to be filled out   He is here requesting that I fill out a form from [employer] to verify that he is still disabled from a CVA in March 2011.  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 He was previously left hand dominant.  He is able to walk a few steps with a cane. He is using a wheelchair for mobility in the office. States he took the [employer] transportation. States he is not fasting today. Reports good medication compliance.  No other concerns or complaints today.   He is going to PT once monthly at the [facility] with Dr. Belia Heman. States he does the therapy at home more often.   He is seeing Dr. Stanford Breed, cardiologist,   Denies fever, chills, dizziness, chest pain, palpitations, shortness of breath, cough, abdominal pain, N/V/D.     Review of Systems Pertinent positives and negatives in the history of present illness.     Objective:   Physical Exam  Constitutional: He is oriented to person, place, and time. He appears well-developed and well-nourished. No distress.  Pulmonary/Chest: Effort normal.  Neurological: He is alert and oriented to person, place, and time.  In a wheelchair  Skin: Skin is warm and dry. No cyanosis. No pallor.  Psychiatric: He has a normal mood and affect. His speech is normal and behavior is normal. Judgment and thought content normal.   BP 130/80   Pulse 65   Wt 172 lb (78 kg)   BMI 23.33 kg/m       Assessment & Plan:  Left spastic hemiparesis (HCC)  Encounter for completion of form with patient  Essential hypertension - Plan: CBC with Differential/Platelet, Basic metabolic panel  History of CVA with residual deficit - Plan: Lipid panel  Elevated LDL cholesterol level - Plan: Lipid panel  Recent  CPE in November 2017.  Blood pressure is within goal range. Continue on current medication regimen.  Follow up as scheduled for echo and with cardiologist.  Discussed minimizing risk factors for future cardiac event or stroke by keeping blood pressure under good control. His lipids were elevated and he was out of [place] months. Will have him return for fasting labs and plan to start him on a statin if appropriate. It is not clear as to why he is not currently on a statin.  He will continue PT and mobility exercises for his left upper and lower extremities.  Follow up pending labs.  Extensive form filled out for insurance purposes.

## 2016-02-25 ENCOUNTER — Other Ambulatory Visit: Payer: Self-pay

## 2016-02-25 ENCOUNTER — Ambulatory Visit (HOSPITAL_COMMUNITY): Payer: Medicare Other | Attending: Cardiology

## 2016-02-25 DIAGNOSIS — I517 Cardiomegaly: Secondary | ICD-10-CM | POA: Diagnosis not present

## 2016-02-25 DIAGNOSIS — Z8673 Personal history of transient ischemic attack (TIA), and cerebral infarction without residual deficits: Secondary | ICD-10-CM | POA: Insufficient documentation

## 2016-02-25 DIAGNOSIS — I348 Other nonrheumatic mitral valve disorders: Secondary | ICD-10-CM | POA: Diagnosis not present

## 2016-02-25 DIAGNOSIS — I119 Hypertensive heart disease without heart failure: Secondary | ICD-10-CM | POA: Diagnosis not present

## 2016-02-29 DIAGNOSIS — M24542 Contracture, left hand: Secondary | ICD-10-CM | POA: Diagnosis not present

## 2016-02-29 DIAGNOSIS — I63449 Cerebral infarction due to embolism of unspecified cerebellar artery: Secondary | ICD-10-CM | POA: Diagnosis not present

## 2016-02-29 DIAGNOSIS — I69352 Hemiplegia and hemiparesis following cerebral infarction affecting left dominant side: Secondary | ICD-10-CM | POA: Diagnosis not present

## 2016-03-06 ENCOUNTER — Other Ambulatory Visit: Payer: Self-pay | Admitting: Cardiology

## 2016-03-06 ENCOUNTER — Other Ambulatory Visit: Payer: Self-pay | Admitting: Family Medicine

## 2016-03-06 DIAGNOSIS — E876 Hypokalemia: Secondary | ICD-10-CM

## 2016-03-06 DIAGNOSIS — I10 Essential (primary) hypertension: Secondary | ICD-10-CM

## 2016-03-07 ENCOUNTER — Other Ambulatory Visit: Payer: Medicare Other

## 2016-03-07 DIAGNOSIS — I10 Essential (primary) hypertension: Secondary | ICD-10-CM

## 2016-03-07 DIAGNOSIS — I6930 Unspecified sequelae of cerebral infarction: Secondary | ICD-10-CM

## 2016-03-07 DIAGNOSIS — E7800 Pure hypercholesterolemia, unspecified: Secondary | ICD-10-CM | POA: Diagnosis not present

## 2016-03-07 LAB — LIPID PANEL
CHOLESTEROL: 211 mg/dL — AB (ref <200)
HDL: 47 mg/dL (ref >40)
LDL Cholesterol: 143 mg/dL — ABNORMAL HIGH (ref <100)
Total CHOL/HDL Ratio: 4.5 Ratio (ref <5.0)
Triglycerides: 106 mg/dL (ref <150)
VLDL: 21 mg/dL (ref <30)

## 2016-03-07 LAB — CBC WITH DIFFERENTIAL/PLATELET
BASOS ABS: 0 {cells}/uL (ref 0–200)
Basophils Relative: 0 %
EOS PCT: 3 %
Eosinophils Absolute: 216 cells/uL (ref 15–500)
HCT: 38.3 % — ABNORMAL LOW (ref 38.5–50.0)
Hemoglobin: 12.6 g/dL — ABNORMAL LOW (ref 13.2–17.1)
LYMPHS PCT: 23 %
Lymphs Abs: 1656 cells/uL (ref 850–3900)
MCH: 28.2 pg (ref 27.0–33.0)
MCHC: 32.9 g/dL (ref 32.0–36.0)
MCV: 85.7 fL (ref 80.0–100.0)
MONOS PCT: 10 %
MPV: 10.9 fL (ref 7.5–12.5)
Monocytes Absolute: 720 cells/uL (ref 200–950)
NEUTROS ABS: 4608 {cells}/uL (ref 1500–7800)
NEUTROS PCT: 64 %
PLATELETS: 221 10*3/uL (ref 140–400)
RBC: 4.47 MIL/uL (ref 4.20–5.80)
RDW: 14.1 % (ref 11.0–15.0)
WBC: 7.2 10*3/uL (ref 4.0–10.5)

## 2016-03-07 LAB — BASIC METABOLIC PANEL
BUN: 18 mg/dL (ref 7–25)
CHLORIDE: 105 mmol/L (ref 98–110)
CO2: 29 mmol/L (ref 20–31)
Calcium: 9.8 mg/dL (ref 8.6–10.3)
Creat: 0.99 mg/dL (ref 0.70–1.33)
GLUCOSE: 105 mg/dL — AB (ref 65–99)
POTASSIUM: 3.9 mmol/L (ref 3.5–5.3)
Sodium: 142 mmol/L (ref 135–146)

## 2016-03-07 NOTE — Telephone Encounter (Signed)
Please schedule appt with PCP of patient's choice.p

## 2016-03-08 ENCOUNTER — Encounter: Payer: Self-pay | Admitting: Family Medicine

## 2016-03-08 ENCOUNTER — Other Ambulatory Visit: Payer: Self-pay | Admitting: Family Medicine

## 2016-03-08 DIAGNOSIS — E7800 Pure hypercholesterolemia, unspecified: Secondary | ICD-10-CM | POA: Insufficient documentation

## 2016-03-08 MED ORDER — ATORVASTATIN CALCIUM 10 MG PO TABS: 10.0000 mg | ORAL_TABLET | Freq: Every day | ORAL | 1 refills | Status: DC

## 2016-03-08 NOTE — Telephone Encounter (Signed)
MyChart message sent to patient about making appointment for medication refills per Windell Hummingbird.

## 2016-03-09 ENCOUNTER — Other Ambulatory Visit: Payer: Self-pay | Admitting: Family Medicine

## 2016-03-09 DIAGNOSIS — E7800 Pure hypercholesterolemia, unspecified: Secondary | ICD-10-CM

## 2016-03-28 DIAGNOSIS — I69352 Hemiplegia and hemiparesis following cerebral infarction affecting left dominant side: Secondary | ICD-10-CM | POA: Diagnosis not present

## 2016-03-28 DIAGNOSIS — M24542 Contracture, left hand: Secondary | ICD-10-CM | POA: Diagnosis not present

## 2016-03-28 DIAGNOSIS — I63449 Cerebral infarction due to embolism of unspecified cerebellar artery: Secondary | ICD-10-CM | POA: Diagnosis not present

## 2016-04-14 ENCOUNTER — Other Ambulatory Visit: Payer: Self-pay | Admitting: Family Medicine

## 2016-04-14 ENCOUNTER — Other Ambulatory Visit: Payer: Self-pay | Admitting: Cardiology

## 2016-04-14 DIAGNOSIS — I10 Essential (primary) hypertension: Secondary | ICD-10-CM

## 2016-04-15 ENCOUNTER — Telehealth: Payer: Self-pay | Admitting: Family Medicine

## 2016-04-15 NOTE — Telephone Encounter (Signed)
REFILL 

## 2016-04-15 NOTE — Telephone Encounter (Signed)
error 

## 2016-04-17 NOTE — Telephone Encounter (Signed)
Ok to refill 

## 2016-04-20 ENCOUNTER — Telehealth: Payer: Self-pay | Admitting: Family Medicine

## 2016-04-20 NOTE — Telephone Encounter (Signed)
Called 2 times left message first time, second attempt phone rang then went to a busy ring, to see if we could get him in for a medicare well visit with vickie

## 2016-04-26 DIAGNOSIS — I69352 Hemiplegia and hemiparesis following cerebral infarction affecting left dominant side: Secondary | ICD-10-CM | POA: Diagnosis not present

## 2016-04-26 DIAGNOSIS — I63449 Cerebral infarction due to embolism of unspecified cerebellar artery: Secondary | ICD-10-CM | POA: Diagnosis not present

## 2016-04-26 DIAGNOSIS — M24542 Contracture, left hand: Secondary | ICD-10-CM | POA: Diagnosis not present

## 2016-04-27 ENCOUNTER — Ambulatory Visit: Payer: Medicare Other | Admitting: Family Medicine

## 2016-05-16 ENCOUNTER — Ambulatory Visit (INDEPENDENT_AMBULATORY_CARE_PROVIDER_SITE_OTHER): Payer: Medicare Other | Admitting: Family Medicine

## 2016-05-16 ENCOUNTER — Encounter: Payer: Self-pay | Admitting: Family Medicine

## 2016-05-16 VITALS — BP 122/78 | HR 64 | Wt 177.0 lb

## 2016-05-16 DIAGNOSIS — M79601 Pain in right arm: Secondary | ICD-10-CM | POA: Diagnosis not present

## 2016-05-16 DIAGNOSIS — E7800 Pure hypercholesterolemia, unspecified: Secondary | ICD-10-CM | POA: Diagnosis not present

## 2016-05-16 DIAGNOSIS — Z79899 Other long term (current) drug therapy: Secondary | ICD-10-CM | POA: Diagnosis not present

## 2016-05-16 DIAGNOSIS — I10 Essential (primary) hypertension: Secondary | ICD-10-CM

## 2016-05-16 DIAGNOSIS — G8114 Spastic hemiplegia affecting left nondominant side: Secondary | ICD-10-CM

## 2016-05-16 MED ORDER — BACLOFEN 10 MG PO TABS: 10.0000 mg | ORAL_TABLET | Freq: Three times a day (TID) | ORAL | 1 refills | Status: DC

## 2016-05-16 NOTE — Progress Notes (Signed)
   Subjective:    Patient ID: Steve Burton, male    DOB: 1955-02-22, 61 y.o.   MRN: [id number]  HPI Chief Complaint  Patient presents with  . issues    right arm pain- went to gym and lifted weight, wants to know if baclofen- muscle relaxer would be renewed. talk about magnesium   He is here with complaints of intermittent right bicep soreness for the past month after lifting weights. States pain has improved but soreness lingers. He reports having to use his right arm for "everything" since he cannot easily move his left arm since having a stroke 7 years ago.  Denies any other myalgias or arthralgia. Questions whether the new statin may be related to his right arm pain.  Denies pain today.  Denies fever, chills, unexplained weight change, chest pain, palpitations, shortness of breath, cough, abdominal pain, back pain, N/V/D. No LE edema.   He continues to go to PT once monthly but states he feels like he knows all the exercises and does these at home. He would like to stop going to PT for now.   States he has been taking Baclofen for the past 7 years and would like a refill. Has been taking it 3 times per day. Questions whether he could stop this medication.   He is fasting today and will need recheck of lipids since starting on statin due to elevated LDL and ASCVD 10 year risk of 9.7%.   Reviewed allergies, medications, past medical, surgical, and social history.    Review of Systems Pertinent positives and negatives in the history of present illness.      Objective:   Physical Exam  Constitutional: He appears well-developed and well-nourished. No distress.  Neck: Normal range of motion. Neck supple.  Musculoskeletal:       Right shoulder: Normal.       Right elbow: Normal.      Right wrist: Normal.  Skin: Skin is warm and dry. No pallor.  Psychiatric: He has a normal mood and affect. His speech is normal and behavior is normal. Thought content normal.   BP 122/78   Pulse 64    Wt 177 lb (80.3 kg)   BMI 24.01 kg/m      Assessment & Plan:  Pain in right arm  Elevated LDL cholesterol level - Plan: Lipid Panel  Medication management  Left spastic hemiparesis (HCC)  Essential hypertension  Suspect his bicep soreness may be related to recent exercises and possible overuse due to left arm weakness and spasticity.  If his pain does not improve then he may stop the statin for a couple of weeks and see if this improves his symptoms. He will keep me posted.  He would like a refill on Baclofen. States he has been taking this for the past 7 years. He questions weaning off this. He may try to wean off. Will let me know.  BP within goal range. Continue current medication.  Will check fasting lipids.  Recommend he start exercising more than 1 day per week. Increase to 2 days for now.  He will follow up for annual exam in November or December or sooner if needed.

## 2016-05-16 NOTE — Patient Instructions (Signed)
If you continue having right upper arm soreness then you may stop the Atorvastatin for a couple of weeks and see if your symptoms go away. If so, then start back on the medication and if symptoms return let me know.   You may try to cut back on Baclofen if you desire.   Increase exercise slowly. Start by going to the gym 2 days per week instead of 1.

## 2016-05-17 ENCOUNTER — Telehealth: Payer: Self-pay | Admitting: Family Medicine

## 2016-05-17 LAB — LIPID PANEL
CHOLESTEROL: 160 mg/dL (ref <200)
HDL: 46 mg/dL (ref >40)
LDL CALC: 93 mg/dL (ref <100)
Total CHOL/HDL Ratio: 3.5 Ratio (ref <5.0)
Triglycerides: 106 mg/dL (ref <150)
VLDL: 21 mg/dL (ref <30)

## 2016-05-17 NOTE — Telephone Encounter (Signed)
Left 3rd message that pt needs a medicare well  Visit after 11-19-2015 with vickie henson

## 2016-06-02 ENCOUNTER — Other Ambulatory Visit: Payer: Self-pay | Admitting: Physician Assistant

## 2016-06-02 DIAGNOSIS — I10 Essential (primary) hypertension: Secondary | ICD-10-CM

## 2016-07-01 ENCOUNTER — Encounter: Payer: Self-pay | Admitting: Family Medicine

## 2016-07-04 MED ORDER — ATORVASTATIN CALCIUM 10 MG PO TABS: 10.0000 mg | ORAL_TABLET | Freq: Every day | ORAL | 1 refills | Status: DC

## 2016-08-11 ENCOUNTER — Telehealth: Payer: Self-pay

## 2016-08-11 DIAGNOSIS — I10 Essential (primary) hypertension: Secondary | ICD-10-CM

## 2016-08-11 MED ORDER — LOSARTAN POTASSIUM 100 MG PO TABS: 100.0000 mg | ORAL_TABLET | Freq: Every day | ORAL | 1 refills | Status: DC

## 2016-08-11 NOTE — Telephone Encounter (Signed)
done

## 2016-08-11 NOTE — Telephone Encounter (Signed)
PT needs refill of losartan called to [facility]. Victor[place] December

## 2016-10-26 ENCOUNTER — Other Ambulatory Visit: Payer: Self-pay | Admitting: Cardiology

## 2016-11-14 ENCOUNTER — Telehealth: Payer: Self-pay | Admitting: Family Medicine

## 2016-11-14 DIAGNOSIS — G8114 Spastic hemiplegia affecting left nondominant side: Secondary | ICD-10-CM

## 2016-11-14 NOTE — Telephone Encounter (Signed)
Ok to refer.

## 2016-11-14 NOTE — Telephone Encounter (Signed)
Pt called and is requesting a referral to the [facility] [place] he is needing a resting hand splint be made, so he is wanting to go there, pt can be reached at [phone]

## 2016-11-14 NOTE — Telephone Encounter (Signed)
I have faxed over referral to the [facility] of [place] for pt

## 2016-11-17 DIAGNOSIS — I639 Cerebral infarction, unspecified: Secondary | ICD-10-CM | POA: Diagnosis not present

## 2016-11-17 DIAGNOSIS — R259 Unspecified abnormal involuntary movements: Secondary | ICD-10-CM | POA: Diagnosis not present

## 2016-11-18 DIAGNOSIS — I69398 Other sequelae of cerebral infarction: Secondary | ICD-10-CM | POA: Insufficient documentation

## 2016-11-18 DIAGNOSIS — I639 Cerebral infarction, unspecified: Secondary | ICD-10-CM | POA: Insufficient documentation

## 2016-11-20 ENCOUNTER — Other Ambulatory Visit: Payer: Self-pay | Admitting: Family Medicine

## 2016-11-20 NOTE — Progress Notes (Signed)
Steve Burton is a 61 y.o. male with a history of CVA, HTN and left spastic hemiparesis who presents for annual wellness visit and follow-up on chronic medical conditions.  He has the following concerns:  He is requesting PT for left arm and left leg spasticity. States his left leg has been more bothersome over the past 6 months. States he is walking less due to increased spasticity and weakness and he reports having fallen out of his wheelchair within the past week. Denies hitting his head or LOC. Denies neck or back pain. States he did not sustain any injuries.  Normally he does try to ambulate with a cane while at home.  Reports ongoing right intermittent forearm pain. No numbness, tingling or weakness. States pain is mainly after doing dumbbell curls at the gym. Denies pain today.    States he has not been checking his blood pressure at home over the past several weeks.  He is aware that his blood pressure is elevated today.    Immunization History  Administered Date(s) Administered  . Td 07/25/2014   Last colonoscopy: 10/2014 and due in 2026 Last PSA: 11/20/2015 Dentist: twice annually  Ophtho: cataract surgery 2-3 years ago  Exercise: wheelchair  Diet is vegetarian and low salt.   Other doctors caring for patient include: Dr. Katy Fitch- eyes Dr. Stanford Breed - cardiologist  Dr. Gloriann Loan - dentist Dr. Ardis Hughs- GI    Depression screen:  See questionnaire below.     Depression screen [facility] 2/9 11/21/2016 07/25/2014 05/16/2013  Decreased Interest 0 0 0  Down, Depressed, Hopeless 0 0 0  PHQ - 2 Score 0 0 0    Fall Screen: See Questionaire below.   Fall Risk  11/21/2016 05/16/2013  Falls in the past year? Yes No  Number falls in past yr: 1 -  Injury with Fall? No -  Risk for fall due to : - Impaired mobility;Impaired balance/gait    ADL screen:  See questionnaire below.  Functional Status Survey: Is the patient deaf or have difficulty hearing?: Yes(thinks he has wax in right ear) Does  the patient have difficulty seeing, even when wearing glasses/contacts?: No Does the patient have difficulty concentrating, remembering, or making decisions?: Yes(short term memory) Does the patient have difficulty walking or climbing stairs?: Yes(wheelchair bound) Does the patient have difficulty dressing or bathing?: Yes(needs assisstance) Does the patient have difficulty doing errands alone such as visiting a doctor's office or shopping?: Yes   End of Life Discussion:  Patient does not have a living will and medical power of attorney. Documents given and discussed.    Review of Systems  Constitutional: -fever, -chills, -sweats, -unexpected weight change, -anorexia, -fatigue Allergy: -sneezing, -itching, -congestion Dermatology: denies changing moles, rash, lumps, new worrisome lesions ENT: -runny nose, -ear pain, -sore throat, -hoarseness, -sinus pain, -teeth pain, -tinnitus, -hearing loss, -epistaxis Cardiology:  -chest pain, -palpitations, -edema, -orthopnea, -paroxysmal nocturnal dyspnea Respiratory: -cough, -shortness of breath, -dyspnea on exertion, -wheezing, -hemoptysis Gastroenterology: -abdominal pain, -nausea, -vomiting, -diarrhea, -constipation, -blood in stool, -changes in bowel movement, -dysphagia Hematology: -bleeding or bruising problems Musculoskeletal: -arthralgias, -myalgias, -joint swelling, -back pain, -neck pain, -cramping, -gait changes Ophthalmology: -vision changes, -eye redness, -itching, -discharge Urology: -dysuria, -difficulty urinating, -hematuria, -urinary frequency, -urgency, incontinence Neurology: -headache, -weakness, -tingling, -numbness, -speech abnormality, -memory loss, +falls, -dizziness Psychology:  -depressed mood, -agitation, -sleep problems   PHYSICAL EXAM:  BP (!) 150/90   Pulse 72   Resp 16   Ht 5\' 11"  (1.803 m)   Wt 181  lb (82.1 kg)   BMI 25.24 kg/m   General Appearance: Alert, cooperative, no distress, appears stated age Head:  Normocephalic, without obvious abnormality, atraumatic Eyes: PERRL, conjunctiva/corneas clear, EOM's intact, fundi benign Ears: Normal TM's and external ear canals Nose: Nares normal, mucosa normal, no drainage or sinus   tenderness Throat: Lips, mucosa, and tongue normal; teeth and gums normal Neck: Supple, no lymphadenopathy, thyroid:no enlargement/tenderness/nodules; no carotid bruit or JVD Back: Spine nontender, no curvature, ROM normal, no CVA tenderness Lungs: Clear to auscultation bilaterally without wheezes, rales or ronchi; respirations unlabored Chest Wall: No tenderness or deformity Heart: Regular rate and rhythm, S1 and S2 normal, no murmur, rub or gallop Breast Exam: No chest wall tenderness, masses or gynecomastia Abdomen: Soft, non-tender, nondistended, normoactive bowel sounds, no masses, no hepatosplenomegaly Genitalia: Normal male external genitalia without lesions.  Testicles without masses.  No inguinal hernias.  Extremities: No clubbing, cyanosis or edema.  Left upper and lower extremity paralysis with left hand contracture.  Pulses: 2+ and symmetric all extremities Skin: Skin color, texture, turgor normal, no rashes or lesions Lymph nodes: Cervical, supraclavicular, and axillary nodes normal Neurologic: CNII-XII intact,  Psych: Normal mood, affect, hygiene and grooming  ASSESSMENT/PLAN: Medicare annual wellness visit, subsequent  Essential hypertension - Plan: CBC with Differential/Platelet, Comprehensive metabolic panel, Lipid panel  Left spastic hemiparesis ([place]) - Plan: Ambulatory referral to Physical Therapy  Elevated LDL cholesterol level  Annual physical exam - Plan: Visual acuity screening, CBC with Differential/Platelet, Comprehensive metabolic panel, POCT Urinalysis DIP (Proadvantage Device), Lipid panel, TSH  Screening for prostate cancer - Plan: PSA  Medication management  Immunization refused  History of CVA with residual deficit - Plan:  Ambulatory referral to Physical Therapy  He appears to be doing well.  His wife who is also his caregiver is with him.  He denies feeling depressed. Discussed that his blood pressure is elevated and the importance of making sure his blood pressure is well controlled due to history of CVA.  He will start checking his blood pressure outside of here and call me in 1 week with his blood pressure readings.  I will see him back in 1 month for a follow-up on hypertension. Plan to refer him to physical therapy due to left-sided hemiparesis and spasticity.  He would like to continue on baclofen and this was refilled. He appears to be doing well on all medications without any side effects. Up-to-date on colonoscopy.  PSA reviewed last year and was normal.  Repeat PSA ordered today. Refused flu and all immunizations. Advance directives discussed and paperwork given for he and his wife to take home and discuss.  He has an upcoming appointment with his cardiologist in February. Follow-up pending labs on 1 month for hypertension.   Discussed PSA screening (risks/benefits), recommended at least 30 minutes of aerobic activity at least 5 days/week; proper sunscreen use reviewed; healthy diet and alcohol recommendations (less than or equal to 2 drinks/day) reviewed; regular seatbelt use; changing batteries in smoke detectors. Immunization recommendations discussed.  Colonoscopy recommendations reviewed and he is up to date.    Medicare Attestation I have personally reviewed: The patient's medical and social history Their use of alcohol, tobacco or illicit drugs Their current medications and supplements The patient's functional ability including ADLs,fall risks, home safety risks, cognitive, and hearing and visual impairment Diet and physical activities Evidence for depression or mood disorders  The patient's weight, height, and BMI have been recorded in the chart.  I have made referrals, counseling,  and  provided education to the patient based on review of the above and I have provided the patient with a written personalized care plan for preventive services.     Harland Dingwall, NP-C   11/21/2016

## 2016-11-21 ENCOUNTER — Encounter: Payer: Self-pay | Admitting: Family Medicine

## 2016-11-21 ENCOUNTER — Ambulatory Visit (INDEPENDENT_AMBULATORY_CARE_PROVIDER_SITE_OTHER): Payer: Medicare Other | Admitting: Family Medicine

## 2016-11-21 VITALS — BP 150/90 | HR 72 | Resp 16 | Ht 71.0 in | Wt 181.0 lb

## 2016-11-21 DIAGNOSIS — Z125 Encounter for screening for malignant neoplasm of prostate: Secondary | ICD-10-CM

## 2016-11-21 DIAGNOSIS — G8114 Spastic hemiplegia affecting left nondominant side: Secondary | ICD-10-CM

## 2016-11-21 DIAGNOSIS — I10 Essential (primary) hypertension: Secondary | ICD-10-CM

## 2016-11-21 DIAGNOSIS — I6930 Unspecified sequelae of cerebral infarction: Secondary | ICD-10-CM

## 2016-11-21 DIAGNOSIS — E7800 Pure hypercholesterolemia, unspecified: Secondary | ICD-10-CM

## 2016-11-21 DIAGNOSIS — Z0000 Encounter for general adult medical examination without abnormal findings: Secondary | ICD-10-CM | POA: Diagnosis not present

## 2016-11-21 DIAGNOSIS — R972 Elevated prostate specific antigen [PSA]: Secondary | ICD-10-CM | POA: Diagnosis not present

## 2016-11-21 DIAGNOSIS — R7301 Impaired fasting glucose: Secondary | ICD-10-CM | POA: Diagnosis not present

## 2016-11-21 DIAGNOSIS — Z2821 Immunization not carried out because of patient refusal: Secondary | ICD-10-CM

## 2016-11-21 DIAGNOSIS — Z79899 Other long term (current) drug therapy: Secondary | ICD-10-CM | POA: Diagnosis not present

## 2016-11-21 LAB — POCT URINALYSIS DIP (PROADVANTAGE DEVICE)
BILIRUBIN UA: NEGATIVE
BILIRUBIN UA: NEGATIVE mg/dL
GLUCOSE UA: NEGATIVE mg/dL
Leukocytes, UA: NEGATIVE
Nitrite, UA: NEGATIVE
Protein Ur, POC: NEGATIVE mg/dL
RBC UA: NEGATIVE
SPECIFIC GRAVITY, URINE: 1.025
Urobilinogen, Ur: 3.5
pH, UA: 6 (ref 5.0–8.0)

## 2016-11-21 NOTE — Telephone Encounter (Signed)
Ok to refill   thanks

## 2016-11-21 NOTE — Patient Instructions (Addendum)
MEDICARE PREVENTATIVE SERVICES (MALE) AND PERSONALIZED PLAN for  Arthor Gorter November 21, 2016  CONDITIONS OR RISKS IDENTIFIED TODAY: At risk for fall. Referred to PT for strengthening. They will call you to schedule an appointment.  HTN- BP is not in goal. Goal range is <130/80  SPECIFIC RECOMMENDATIONS: Start checking your blood pressure at home. Your BP today is elevated 146/88.  Please call me in 1 week and let me know what your BP is at home.    Influenza vaccine: declines  Pneumococcal vaccine: declines  Shingles vaccine: declines  Tdap vaccine: Td-07/2014 Colonoscopy: due in 2026    Return in 1 month follow up for HTN. Bring in BP readings and machine.  Follow up with your cardiologist as scheduled in February.    GENERAL RECOMMENDATIONS FOR GOOD HEALTH:  Supplements:  . Take a daily baby Aspirin 81mg  at bedtime for heart health unless you have a history of gastrointestinal bleed, allergy to aspirin, or are already taking higher dose Aspirin or other antiplatelet or blood thinner medication.   . Consume 1200 mg of Calcium daily through dietary calcium or supplement if you are male age 14 or older, or men 57 and older.   Men aged 73-70 should consume 1000 mg of Calcium daily. . Take 600 IU of Vitamin D daily.  Take 800 IU of Calcium daily if you are older than age 67.  . Take a general multivitamin daily.   Healthy diet: Eat a variety of foods, including fruits, vegetables, vegetable protein such as beans, lentils, tofu, and grains, such as rice.  Limit meat or animal protein, but if you eat meat, choose leans cuts such as chicken, fish, or [place].  Drink plenty of water daily.  Decrease saturated fat in the diet, avoid lots of red meat, processed foods, sweets, fast foods, and fried foods.  Limit salt and caffeine intake.  Exercise: Aerobic exercise helps maintain good heart health. Weight bearing exercise helps keep bones and muscles working strong.  We recommend at  least 30-40 minutes of exercise most days of the week.   Fall prevention: Falls are the leading cause of injuries, accidents, and accidental deaths in people over the age of 33. Falling is a real threat to your ability to live on your own.  Causes include poor eyesight or poor hearing, illness, poor lighting, throw rugs, clutter in your home, and medication side effects causing dizziness or balance problems.  Such medications can include medications for depression, sleep problems, high blood pressure, diabetes, and heart conditions.   PREVENTION  Be sure your home is as safe as possible. Here are some tips:  Wear shoes with non-skid soles (not house slippers).   Be sure your home and outside area are well lit.   Use night lights throughout your house, including hallways and stairways.   Remove clutter and clean up spills on floors and walkways.   Remove throw rugs or fasten them to the floor with carpet tape. Tack down carpet edges.   Do not place electrical cords across pathways.   Install grab bars in your bathtub, shower, and toilet area. Towel bars should not be used as a grab bar.   Install handrails on both sides of stairways.   Do not climb on stools or stepladders. Get someone else to help with jobs that require climbing.   Do not wax your floors at all, or use a non-skid wax.   Repair uneven or unsafe sidewalks, walkways or stairs.   Keep  frequently used items within reach.   Be aware of pets so you do not trip.  Get regular check-ups from your doctor, and take good care of yourself:  Have your eyes checked every year for vision changes, cataracts, glaucoma, and other eye problems. Wear eyeglasses as directed.   Have your hearing checked every 2 years, or anytime you or others think that you cannot hear well. Use hearing aids as directed.   See your caregiver if you have foot pain or corns. Sore feet can contribute to falls.   Let your caregiver know if a medicine is  making you feel dizzy or making you lose your balance.   Use a cane, walker, or wheelchair as directed. Use walker or wheelchair brakes when getting in and out.   When you get up from bed, sit on the side of the bed for 1 to 2 minutes before you stand up. This will give your blood pressure time to adjust, and you will feel less dizzy.   If you need to go to the bathroom often, consider using a bedside commode.  Disease prevention:  If you smoke or chew tobacco, find out from your caregiver how to quit. It can literally save your life, no matter how long you have been a tobacco user. If you do not use tobacco, never begin. Medicare does cover some smoking cessation counseling.  Maintain a healthy diet and normal weight. Increased weight leads to problems with blood pressure and diabetes. We check your height, weight, and BMI as part of your yearly visit.  The Body Mass Index or BMI is a way of measuring how much of your body is fat. Having a BMI above 27 increases the risk of heart disease, diabetes, hypertension, stroke and other problems related to obesity. Your caregiver can help determine your BMI and based on it develop an exercise and dietary program to help you achieve or maintain this important measurement at a healthful level.  High blood pressure causes heart and blood vessel problems.  Persistent high blood pressure should be treated with medicine if weight loss and exercise do not work.  We check your blood pressure as part of your yearly visit.  Avoid drinking alcohol in excess (more than two drinks per day).  Avoid use of street drugs. Do not share needles with anyone. Ask for professional help if you need assistance or instructions on stopping the use of alcohol, cigarettes, and/or drugs.  Brush your teeth twice a day with fluoride toothpaste, and floss once a day. Good oral hygiene prevents tooth decay and gum disease. The problems can be painful, unattractive, and can cause other  health problems. Visit your dentist for a routine oral and dental checkup and preventive care every 6-12 months.   See your eye doctor yearly for routine screening for things like glaucoma.  Look at your skin regularly.  Use a mirror to look at your back. Notify your caregivers of changes in moles, especially if there are changes in shapes, colors, a size larger than a pencil eraser, an irregular border, or development of new moles.  Safety:  Use seatbelts 100% of the time, whether driving or as a passenger.  Use safety devices such as hearing protection if you work in environments with loud noise or significant background noise.  Use safety glasses when doing any work that could send debris in to the eyes.  Use a helmet if you ride a bike or motorcycle.  Use appropriate safety gear for  contact sports.  Talk to your caregiver about gun safety.  Use sunscreen with a SPF (or skin protection factor) of 15 or greater.  Lighter skinned people are at a greater risk of skin cancer. Don't forget to also wear sunglasses in order to protect your eyes from too much damaging sunlight. Damaging sunlight can accelerate cataract formation.   If you have multiple sexual partners, or if you are not in a monogamous relationship, practice safe sex. Use condoms. Condoms are used to help reduce the spread of sexually transmitted infections (or STIs).  Consider an HIV test if you have never been tested.  Consider routine screening for STIs if you have multiple sexual partners.   Keep carbon monoxide and smoke detectors in your home functioning at all times. Change the batteries every 6 months or use a model that plugs into the wall or is hard wired in.   END OF LIFE PLANNING/ADVANCED DIRECTIVES Advance health-care planning is deciding the kind of care you want at the end of life. While alert competent adults are able to exercise their rights to make health care and financial decisions, problems arise when an individual  becomes unconscious, incapacitated, or otherwise unable to communicate or make such decisions. Advance health care directives are the legal documents in which you give written instructions about your choices limited, aggressive or palliative care if, in the future, you cannot speak for yourself.  Advanced directives include the following: [place] allows you to appoint someone to act as your health care agent to make health care decisions for you should it be determined by your health care provider that you are no longer able to make these decisions for yourself.  A Living Will is a legal document in which you can declare that under certain conditions you desire your life not be prolonged by extraordinary or artificial means during your last illness or when you are near death. We can provide you with sample advanced directives, you can get an attorney to prepare these for you, or you can visit [place] Secretary of State's website for additional information and resources at [email or web address]  Further, I recommend you have an attorney prepare a Will and Durable Power of Attorney if you haven't done so already.  Please get [place] a copy of your health care Advanced Directives.   PREVENTATIV E CARE RECOMMENDATIONS:  Vaccinations: We recommend the following vaccinations as part of your preventative care:  Pneumococcal vaccine is recommended to protect against certain types of pneumonia.  This is normally recommended for adults age 63 or older once, or up to every 5 years for those at high risk.  The vaccine is also recommended for adults younger than 59 years old with certain underlying conditions that make them high risk for pneumonia.  Influenza vaccine is recommended to protect against seasonal influenza or "the flu." Influenza is a serious disease that can lead to hospitalization and sometimes even death. Traditional flu vaccines (called trivalent vaccines) are made  to protect against three flu viruses; an influenza A (H1N1) virus, an influenza A (H3N2) virus, and an influenza B virus. In addition, there are flu vaccines made to protect against four flu viruses (called "quadrivalent" vaccines). These vaccines protect against the same viruses as the trivalent vaccine and an additional B virus.  We recommend the high dose influenza vaccine to those 65 years and older.  Hepatitis B vaccine to protect against a form of infection of the liver by a virus acquired  from blood or body fluids, particularly for high risk groups.  Td or Tdap vaccine to protect against Tetanus, diphtheria and pertussis which can be very serious.  These diseases are caused by bacteria.  Diphtheria and pertussis are spread from person to person through coughing or sneezing.  Tetanus enters the body through cuts, scratches, or wounds.  Tetanus (Lockjaw) causes painful muscle tightening and stiffness, usually all over the body.  Diphtheria can cause a thick coating to form in the back of the throat.  It can lead to breathing problems, paralysis, heart failure, and death.  Pertussis (Whooping Cough) causes severe coughing spells, which can cause difficulty breathing, vomiting and disturbed sleep.  Td or Tdap is usually given every 10 years.  Shingles vaccine to protect against Varicella Zoster if you are older than age 45, or younger than 59 years old with certain underlying illness.    Cancer Screening: Most routine colon cancer screening begins at the age of 59.  Subsequent colonoscopies are performed either every 5-10 years for normal screening, or every 2-5 years for higher risks patients, up until age 80 years of age. Annual screening is done with easy to use take-home tests to check for hidden blood in the stool called hemoccult tests.  Sigmoidoscopy or colonoscopy can detect the earliest forms of colon cancer and is life saving. These tests use a small camera at the end of a tube to directly  examine the colon.   Prostate cancer screening usually begins at age 41 years old, or younger age for those with higher risk.  Those at higher risk include African-Americans or having a family history of prostate cancer. There are two types of tests for prostate cancer - Prostate-specific antigen (PSA) testing. Recent studies raise questions about prostate cancer using PSA and you should discuss this with your caregiver.  The other type of test is the digital rectal exam (in which your doctor's lubricated and gloved finger feels for enlargement of the prostate through the anus).  We routinely stop testing at age 50 years of age.  Osteoporosis Screening: Screening for osteoporosis usually begins at age 34 for women, and can be done as frequent as every 2 years.  However, women or men with higher risk of osteoporosis may be screened earlier than age 9.  Osteoporosis or low bone mass is diminished bone strength from alterations in bone architecture leading to bone fragility and increased fracture risk.     Cardiovascular Screening: Fat and cholesterol leaves deposits in your arteries that can block them. This causes heart disease and vessel disease elsewhere in your body.  If your cholesterol is found to be high, or if you have heart disease or certain other medical conditions, then you may need to have your cholesterol monitored frequently and be treated with medication. Cardiovascular screening in the form of lab tests for cholesterol, HDL and triglycerides can be done every 5 years.  A screening electrocardiogram can be done as part of the Welcome to Medicare physical.  Diabetes Screening: Diabetes screening can be done at least every 3 years for those with risk factors,  or every 6-72months for prediabetic patients.  Screening includes fasting blood sugar test or glucose tolerance test.  Risk factors include hypertension, dyslipidemia, obesity, previously abnormal glucose tests, family history of  diabetes, age 79 years or older, and history of gestations diabetes.   AAA (abdominal aortic aneurysm) Screening: Medicare allows for a one time ultrasound to screen for abdominal aortic aneurysm if done as  a referral as part of the Welcome to Medicare exam.  Men eligible for this screening include those men between age 68-40 years of age who have smoked at least 100 cigarettes in his lifetime and/or has a family history of AAA.  HIV Screening:  Medicare allows for yearly screening for patients at high risk for contracting HIV disease.

## 2016-11-21 NOTE — Telephone Encounter (Signed)
Please advise on med refill. Steve Burton

## 2016-11-22 ENCOUNTER — Encounter: Payer: Self-pay | Admitting: Family Medicine

## 2016-11-22 DIAGNOSIS — R7301 Impaired fasting glucose: Secondary | ICD-10-CM

## 2016-11-22 DIAGNOSIS — R972 Elevated prostate specific antigen [PSA]: Secondary | ICD-10-CM | POA: Insufficient documentation

## 2016-11-22 HISTORY — DX: Elevated prostate specific antigen (PSA): R97.20

## 2016-11-22 HISTORY — DX: Impaired fasting glucose: R73.01

## 2016-11-23 ENCOUNTER — Emergency Department (HOSPITAL_COMMUNITY): Payer: Medicare Other

## 2016-11-23 ENCOUNTER — Encounter (HOSPITAL_COMMUNITY): Payer: Self-pay | Admitting: Emergency Medicine

## 2016-11-23 ENCOUNTER — Ambulatory Visit (INDEPENDENT_AMBULATORY_CARE_PROVIDER_SITE_OTHER): Payer: Medicare Other | Admitting: Family Medicine

## 2016-11-23 ENCOUNTER — Emergency Department (HOSPITAL_COMMUNITY)
Admission: EM | Admit: 2016-11-23 | Discharge: 2016-11-23 | Disposition: A | Discharge status: Home / Self Care | Payer: Medicare Other | Attending: Emergency Medicine | Admitting: Emergency Medicine

## 2016-11-23 ENCOUNTER — Encounter: Payer: Self-pay | Admitting: Family Medicine

## 2016-11-23 VITALS — BP 182/110 | HR 88 | Temp 98.3°F

## 2016-11-23 DIAGNOSIS — M545 Low back pain: Secondary | ICD-10-CM | POA: Diagnosis not present

## 2016-11-23 DIAGNOSIS — R109 Unspecified abdominal pain: Secondary | ICD-10-CM | POA: Diagnosis not present

## 2016-11-23 DIAGNOSIS — I10 Essential (primary) hypertension: Secondary | ICD-10-CM

## 2016-11-23 DIAGNOSIS — N201 Calculus of ureter: Secondary | ICD-10-CM | POA: Diagnosis not present

## 2016-11-23 DIAGNOSIS — G8114 Spastic hemiplegia affecting left nondominant side: Secondary | ICD-10-CM | POA: Diagnosis not present

## 2016-11-23 DIAGNOSIS — I6930 Unspecified sequelae of cerebral infarction: Secondary | ICD-10-CM

## 2016-11-23 DIAGNOSIS — Z79899 Other long term (current) drug therapy: Secondary | ICD-10-CM | POA: Diagnosis not present

## 2016-11-23 DIAGNOSIS — N200 Calculus of kidney: Secondary | ICD-10-CM | POA: Insufficient documentation

## 2016-11-23 DIAGNOSIS — N132 Hydronephrosis with renal and ureteral calculous obstruction: Secondary | ICD-10-CM | POA: Diagnosis not present

## 2016-11-23 DIAGNOSIS — R1031 Right lower quadrant pain: Secondary | ICD-10-CM | POA: Diagnosis present

## 2016-11-23 LAB — POCT URINALYSIS DIP (PROADVANTAGE DEVICE)
BILIRUBIN UA: NEGATIVE
Glucose, UA: NEGATIVE mg/dL
LEUKOCYTES UA: NEGATIVE
NITRITE UA: NEGATIVE
PH UA: 8 (ref 5.0–8.0)
Specific Gravity, Urine: 1.015
Urobilinogen, Ur: NEGATIVE

## 2016-11-23 LAB — COMPREHENSIVE METABOLIC PANEL
AG RATIO: 1.7 (calc) (ref 1.0–2.5)
ALBUMIN MSPROF: 4.2 g/dL (ref 3.6–5.1)
ALT: 25 U/L (ref 9–46)
ALT: 33 U/L (ref 17–63)
ANION GAP: 8 (ref 5–15)
AST: 18 U/L (ref 10–35)
AST: 24 U/L (ref 15–41)
Albumin: 3.9 g/dL (ref 3.5–5.0)
Alkaline Phosphatase: 79 U/L (ref 38–126)
Alkaline phosphatase (APISO): 78 U/L (ref 40–115)
BUN: 16 mg/dL (ref 7–25)
BUN: 13 mg/dL (ref 6–20)
CHLORIDE: 105 mmol/L (ref 98–110)
CHLORIDE: 106 mmol/L (ref 101–111)
CO2: 28 mmol/L (ref 20–32)
CO2: 25 mmol/L (ref 22–32)
CREATININE: 1 mg/dL (ref 0.70–1.33)
Calcium: 9.6 mg/dL (ref 8.6–10.3)
Calcium: 9.1 mg/dL (ref 8.9–10.3)
Creatinine, Ser: 1.16 mg/dL (ref 0.61–1.24)
GFR calc non Af Amer: >60 mL/min (ref >60)
GLOBULIN: 2.5 g/dL (ref 1.9–3.7)
GLUCOSE: 134 mg/dL — AB (ref 65–99)
Glucose, Bld: 134 mg/dL — ABNORMAL HIGH (ref 65–99)
POTASSIUM: 4.1 mmol/L (ref 3.5–5.3)
POTASSIUM: 4.2 mmol/L (ref 3.5–5.1)
SODIUM: 142 mmol/L (ref 135–146)
SODIUM: 139 mmol/L (ref 135–145)
TOTAL PROTEIN: 6.7 g/dL (ref 6.1–8.1)
Total Bilirubin: 0.6 mg/dL (ref 0.2–1.2)
Total Bilirubin: 0.8 mg/dL (ref 0.3–1.2)
Total Protein: 6.7 g/dL (ref 6.5–8.1)

## 2016-11-23 LAB — CBC WITH DIFFERENTIAL/PLATELET
BASOS PCT: 0.7 %
Basophils Absolute: 60 cells/uL (ref 0–200)
Basophils Absolute: 0 10*3/uL (ref 0.0–0.1)
Basophils Relative: 0 %
EOS ABS: 289 {cells}/uL (ref 15–500)
Eosinophils Absolute: 0 10*3/uL (ref 0.0–0.7)
Eosinophils Relative: 3.4 %
Eosinophils Relative: 0 %
HCT: 37.4 % — ABNORMAL LOW (ref 38.5–50.0)
HEMATOCRIT: 36.3 % — AB (ref 39.0–52.0)
HEMOGLOBIN: 12.4 g/dL — AB (ref 13.2–17.1)
HEMOGLOBIN: 12.2 g/dL — AB (ref 13.0–17.0)
LYMPHS ABS: 1 10*3/uL (ref 0.7–4.0)
Lymphocytes Relative: 8 %
Lymphs Abs: 1683 cells/uL (ref 850–3900)
MCH: 28.5 pg (ref 27.0–33.0)
MCH: 29.3 pg (ref 26.0–34.0)
MCHC: 33.2 g/dL (ref 32.0–36.0)
MCHC: 33.6 g/dL (ref 30.0–36.0)
MCV: 86 fL (ref 80.0–100.0)
MCV: 87.1 fL (ref 78.0–100.0)
MONOS PCT: 6.9 %
MONOS PCT: 6 %
MPV: 12.1 fL (ref 7.5–12.5)
Monocytes Absolute: 0.7 10*3/uL (ref 0.1–1.0)
NEUTROS ABS: 5882 {cells}/uL (ref 1500–7800)
NEUTROS ABS: 10.4 10*3/uL — AB (ref 1.7–7.7)
NEUTROS PCT: 86 %
Neutrophils Relative %: 69.2 %
Platelets: 203 10*3/uL (ref 140–400)
Platelets: 160 10*3/uL (ref 150–400)
RBC: 4.35 10*6/uL (ref 4.20–5.80)
RBC: 4.17 MIL/uL — ABNORMAL LOW (ref 4.22–5.81)
RDW: 12.4 % (ref 11.0–15.0)
RDW: 13.3 % (ref 11.5–15.5)
Total Lymphocyte: 19.8 %
WBC mixed population: 587 cells/uL (ref 200–950)
WBC: 8.5 10*3/uL (ref 3.8–10.8)
WBC: 12.1 10*3/uL — ABNORMAL HIGH (ref 4.0–10.5)

## 2016-11-23 LAB — LIPID PANEL
CHOL/HDL RATIO: 3 (calc) (ref <5.0)
Cholesterol: 163 mg/dL (ref <200)
HDL: 55 mg/dL (ref >40)
LDL CHOLESTEROL (CALC): 91 mg/dL
NON-HDL CHOLESTEROL (CALC): 108 mg/dL (ref <130)
Triglycerides: 81 mg/dL (ref <150)

## 2016-11-23 LAB — URINALYSIS, ROUTINE W REFLEX MICROSCOPIC
Bilirubin Urine: NEGATIVE
Glucose, UA: NEGATIVE mg/dL
Ketones, ur: 20 mg/dL — AB
Leukocytes, UA: NEGATIVE
NITRITE: NEGATIVE
PH: 8 (ref 5.0–8.0)
Protein, ur: NEGATIVE mg/dL
SPECIFIC GRAVITY, URINE: 1.012 (ref 1.005–1.030)
SQUAMOUS EPITHELIAL / LPF: NONE SEEN

## 2016-11-23 LAB — TEST AUTHORIZATION

## 2016-11-23 LAB — PSA, TOTAL AND FREE
PSA, % FREE: 8 % — AB (ref >25)
PSA, FREE: 0.5 ng/mL
PSA, Total: 6.6 ng/mL — ABNORMAL HIGH (ref <=4.0)

## 2016-11-23 LAB — HEMOGLOBIN A1C W/OUT EAG: Hgb A1c MFr Bld: 5.5 % of total Hgb (ref <5.7)

## 2016-11-23 LAB — PSA: PSA: 4.7 ng/mL — ABNORMAL HIGH (ref <=4.0)

## 2016-11-23 LAB — TSH: TSH: 1.33 m[IU]/L (ref 0.40–4.50)

## 2016-11-23 MED ORDER — TAMSULOSIN HCL 0.4 MG PO CAPS: 0.4000 mg | ORAL_CAPSULE | Freq: Two times a day (BID) | ORAL | 0 refills | Status: DC

## 2016-11-23 MED ORDER — SODIUM CHLORIDE 0.9 % IV BOLUS (SEPSIS)
1000.0000 mL | Freq: Once | INTRAVENOUS | Status: AC
  Administered 2016-11-23: 1000 mL via INTRAVENOUS

## 2016-11-23 MED ORDER — MORPHINE SULFATE (PF) 4 MG/ML IV SOLN
4.0000 mg | Freq: Once | INTRAVENOUS | Status: AC
  Administered 2016-11-23: 4 mg via INTRAVENOUS
  Filled 2016-11-23: qty 1

## 2016-11-23 MED ORDER — ONDANSETRON HCL 4 MG/2ML IJ SOLN
4.0000 mg | Freq: Once | INTRAMUSCULAR | Status: AC
  Administered 2016-11-23: 4 mg via INTRAVENOUS
  Filled 2016-11-23: qty 2

## 2016-11-23 MED ORDER — KETOROLAC TROMETHAMINE 30 MG/ML IJ SOLN
15.0000 mg | Freq: Once | INTRAMUSCULAR | Status: AC
  Administered 2016-11-23: 15 mg via INTRAVENOUS
  Filled 2016-11-23: qty 1

## 2016-11-23 MED ORDER — HYDROCODONE-ACETAMINOPHEN 5-325 MG PO TABS: 1.0000 | ORAL_TABLET | Freq: Four times a day (QID) | ORAL | 0 refills | Status: DC | PRN

## 2016-11-23 MED ORDER — ONDANSETRON 4 MG PO TBDP: 4.0000 mg | ORAL_TABLET | Freq: Three times a day (TID) | ORAL | 0 refills | Status: DC | PRN

## 2016-11-23 NOTE — ED Provider Notes (Signed)
[facility] DEPT Provider Note   CSN: [id number] Arrival date & time: 11/23/16  1434     History   Chief Complaint Chief Complaint  Patient presents with  . Flank Pain    right     HPI Steve Burton is a 61 y.o. male.  Steve Burton is a 61 y.o. Male who presents to the ED complaining of right flank and low back pain beginning this morning.  Patient reports around 9 am this morning he developed pain to his right flank and low back. He reports his pain has been constant and does not fluctuate in intensity.  He was seen in urgent care where they thought he might have a kidney stone and was referred to the emergency department.  Patient tells me his pain is in his right lumbar low back and sometimes has pain that radiates into his right groin.  He tells me he has no pain to his penis or his testicles.  His pain is not worse with movement, in fact he tells me his pain improves with movement.  He has a history of a previous CVA and has left-sided hemiparesis and tells me he has no feeling on the left side of his body.  He does report a history of hernia repair in 2005 to his right inguinal area.  He denies any lumps or areas of swelling to this area.  He tells me he has had some urinary urgency today, but no other urinary symptoms.  No dysuria or trouble urinating.  He received fentanyl by EMS in route to the emergency department prior to arrival.  No other treatments attempted.  He does report a fall on the left side of his body 5 days ago and has a bruise noted to his left hip.  He denies any pain with range of motion of his hip.  He denies fevers, coughing, chest pain, shortness of breath, penile pain, testicular pain, difficulty urinating, loss of bladder control, loss of bowel control, rashes, or history of kidney stones.    The history is provided by the patient, medical records and the spouse. No language interpreter was used.  Flank Pain  Pertinent negatives  include no chest pain, no abdominal pain, no headaches and no shortness of breath.    Past Medical History:  Diagnosis Date  . Elevated LDL cholesterol level   . Elevated PSA measurement 11/22/2016  . Hypertension   . Impaired fasting glucose 11/22/2016  . Stroke [facility]) 2011    Patient Active Problem List   Diagnosis Date Noted  . Impaired fasting glucose 11/22/2016  . Elevated PSA measurement 11/22/2016  . History of CVA with residual deficit 11/21/2016  . Immunization refused 11/21/2016  . Elevated LDL cholesterol level 03/08/2016  . Hypertrophic obstructive cardiomyopathy ([place]) 12/03/2014  . Abnormal electrocardiogram 09/02/2014  . Left spastic hemiparesis ([place]) 03/07/2011  . Cerebral infarction ([place]) 02/14/2011  . Essential hypertension 08/02/2008    Past Surgical History:  Procedure Laterality Date  . CATARACT EXTRACTION  2006   right eye  . HERNIA REPAIR  2005       Home Medications    Prior to Admission medications   Medication Sig Start Date End Date Taking? Authorizing Provider  atorvastatin (LIPITOR) 10 MG tablet Take 1 tablet (10 mg total) by mouth daily. 07/04/16  Yes Henson, Vickie L, NP-C  baclofen (LIORESAL) 10 MG tablet TAKE 1 TABLET BY MOUTH 3  TIMES DAILY 11/21/16  Yes Henson, Vickie L, NP-C  diltiazem (CARDIZEM CD) 240 MG 24 hr capsule Take 1 capsule (240 mg total) by mouth daily. 11/04/15  Yes Robyn Haber, MD  labetalol (NORMODYNE) 100 MG tablet TAKE 1 TABLET BY MOUTH TWO  TIMES DAILY 04/15/16  Yes Lelon Perla, MD  labetalol (NORMODYNE) 200 MG tablet TAKE 1 TABLET BY MOUTH 2  TIMES DAILY. 10/26/16  Yes Lelon Perla, MD  losartan (COZAAR) 100 MG tablet Take 1 tablet (100 mg total) by mouth daily. 08/11/16  Yes Henson, Vickie L, NP-C  Multiple Vitamin (MULTIVITAMIN) tablet Take 1 tablet by mouth daily.   Yes [provider]  potassium chloride SA (K-DUR,KLOR-CON) 20 MEQ tablet TAKE 2 TABLETS BY MOUTH  DAILY 03/07/16  Yes Lelon Perla, MD  spironolactone (ALDACTONE) 25 MG tablet TAKE 1 TABLET BY MOUTH  DAILY 03/07/16  Yes Lelon Perla, MD  HYDROcodone-acetaminophen (NORCO/VICODIN) 5-325 MG tablet Take 1-2 tablets by mouth every 6 (six) hours as needed for moderate pain or severe pain. 11/23/16   Waynetta Pean, PA-C  ondansetron (ZOFRAN ODT) 4 MG disintegrating tablet Take 1 tablet (4 mg total) by mouth every 8 (eight) hours as needed for nausea or vomiting. 11/23/16   Waynetta Pean, PA-C  tamsulosin (FLOMAX) 0.4 MG CAPS capsule Take 1 capsule (0.4 mg total) by mouth 2 (two) times daily. 11/23/16   Waynetta Pean, PA-C    Family History Family History  Problem Relation Age of Onset  . Stroke Mother   . Heart disease Father        MI at age 40  . Epilepsy Sister   . Colon cancer Neg Hx     Social History Social History   Tobacco Use  . Smoking status: Never Smoker  . Smokeless tobacco: Never Used  Substance Use Topics  . Alcohol use: Yes    Alcohol/week: 0.0 oz    Comment: 1 glass of wine per week.   . Drug use: No     Allergies   Phenytoin sodium extended and Penicillins   Review of Systems Review of Systems  Constitutional: Negative for chills and fever.  HENT: Negative for congestion and sore throat.   Eyes: Negative for visual disturbance.  Respiratory: Negative for cough and shortness of breath.   Cardiovascular: Negative for chest pain.  Gastrointestinal: Positive for vomiting (resolved ). Negative for abdominal pain, diarrhea and nausea.  Genitourinary: Positive for flank pain and urgency. Negative for decreased urine volume, difficulty urinating, discharge, dysuria, frequency, hematuria, penile pain, scrotal swelling and testicular pain.  Musculoskeletal: Negative for back pain and neck pain.  Skin: Negative for rash.  Neurological: Negative for headaches.     Physical Exam Updated Vital Signs BP (!) 146/71   Pulse 96   Temp 98.2 F (36.8 C) (Oral)   Resp 16   SpO2 98%    Physical Exam  Constitutional: He appears well-developed and well-nourished. No distress.  Nontoxic-appearing.  HENT:  Head: Normocephalic and atraumatic.  Mouth/Throat: Oropharynx is clear and moist.  Eyes: Conjunctivae are normal. Pupils are equal, round, and reactive to light. Right eye exhibits no discharge. Left eye exhibits no discharge.  Neck: Neck supple.  Cardiovascular: Normal rate, regular rhythm, normal heart sounds and intact distal pulses.  Pulmonary/Chest: Effort normal and breath sounds normal. No respiratory distress. He has no wheezes. He has no rales.  Abdominal: Soft. Bowel sounds are normal. He exhibits no distension and no mass. There is no tenderness. There is no guarding. No hernia.  Abdomen is  soft and nontender to palpation.  No CVA or flank tenderness.   Genitourinary: Penis normal. No penile tenderness.  Genitourinary Comments: No penile or testicular tenderness to palpation.  No hernias noted.  Old hernia repair scar noted to his right inguinal area.  Musculoskeletal: He exhibits no edema or deformity.  No midline neck or back TTP.  Old area of ecchymosis noted to his left hip.  No pain with range of motion of his left hip.  No deformity noted.  No overlying skin changes to his back.  No flank tenderness noted to palpation.  Lymphadenopathy:    He has no cervical adenopathy.  Neurological: He is alert. Coordination normal.  Skin: Skin is warm and dry. Capillary refill takes less than 2 seconds. No rash noted. He is not diaphoretic. No erythema. No pallor.  Psychiatric: He has a normal mood and affect. His behavior is normal.  Nursing note and vitals reviewed.    ED Treatments / Results  Labs (all labs ordered are listed, but only abnormal results are displayed) Labs Reviewed  URINALYSIS, ROUTINE W REFLEX MICROSCOPIC - Abnormal; Notable for the following components:      Result Value   Hgb urine dipstick SMALL (*)    Ketones, ur 20 (*)    Bacteria, UA  RARE (*)    All other components within normal limits  COMPREHENSIVE METABOLIC PANEL - Abnormal; Notable for the following components:   Glucose, Bld 134 (*)    All other components within normal limits  CBC WITH DIFFERENTIAL/PLATELET - Abnormal; Notable for the following components:   WBC 12.1 (*)    RBC 4.17 (*)    Hemoglobin 12.2 (*)    HCT 36.3 (*)    Neutro Abs 10.4 (*)    All other components within normal limits    EKG  EKG Interpretation None       Radiology Ct Renal Stone Study  Result Date: 11/23/2016 CLINICAL DATA:  Flank pain with stone disease suspected. EXAM: CT ABDOMEN AND PELVIS WITHOUT CONTRAST TECHNIQUE: Multidetector CT imaging of the abdomen and pelvis was performed following the standard protocol without IV contrast. COMPARISON:  None. FINDINGS: Lower chest: Partly seen mitral annular calcification that is prominent for age. Hepatobiliary: No focal liver abnormality.No evidence of biliary obstruction or stone. Pancreas: Unremarkable. Spleen: Unremarkable. Adrenals/Urinary Tract: Negative adrenals. Right hydronephrosis, nephromegaly, and prominent perinephric edema. There is right hydroureter above a 4 mm stone at the right UVJ. Small cystic density in the lower pole right kidney. No left hydronephrosis. No nephrolithiasis. The urinary bladder is collapsed. Stomach/Bowel: No obstruction. No appendicitis. Mild colonic diverticulosis. Vascular/Lymphatic: No acute vascular abnormality. Aortic and iliac atherosclerosis. No mass or adenopathy. Reproductive:Negative. Other: No ascites or pneumoperitoneum. Musculoskeletal: No acute abnormalities. Disc degeneration, focally advanced at L3-4. IMPRESSION: 1. Obstructing 4 mm stone at the right UVJ. 2.  Aortic Atherosclerosis (ICD10-I70.0). Electronically Signed   By: Monte Fantasia M.D.   On: 11/23/2016 16:30    Procedures Procedures (including critical care time)  Medications Ordered in ED Medications  ondansetron  (ZOFRAN) injection 4 mg (4 mg Intravenous Given 11/23/16 1636)  sodium chloride 0.9 % bolus 1,000 mL (1,000 mLs Intravenous New Bag/Given 11/23/16 1636)  morphine 4 MG/ML injection 4 mg (4 mg Intravenous Given 11/23/16 1636)  sodium chloride 0.9 % bolus 1,000 mL (1,000 mLs Intravenous New Bag/Given 11/23/16 1830)  ketorolac (TORADOL) 30 MG/ML injection 15 mg (15 mg Intravenous Given 11/23/16 1829)     Initial Impression / Assessment and  Plan / ED Course  I have reviewed the triage vital signs and the nursing notes.  Pertinent labs & imaging results that were available during my care of the patient were reviewed by me and considered in my medical decision making (see chart for details).     This is a 61 y.o. Male who presents to the ED complaining of right flank and low back pain beginning this morning.  Patient reports around 9 am this morning he developed pain to his right flank and low back. He reports his pain has been constant and does not fluctuate in intensity.  He was seen in urgent care where they thought he might have a kidney stone and was referred to the emergency department.  Patient tells me his pain is in his right lumbar low back and sometimes has pain that radiates into his right groin.  He tells me he has no pain to his penis or his testicles.  His pain is not worse with movement, in fact he tells me his pain improves with movement.  He has a history of a previous CVA and has left-sided hemiparesis and tells me he has no feeling on the left side of his body.  He does report a history of hernia repair in 2005 to his right inguinal area.  He denies any lumps or areas of swelling to this area.  He tells me he has had some urinary urgency today, but no other urinary symptoms.  No dysuria or trouble urinating.  On exam the patient is afebrile and non-toxic appearing. His abdomen is soft and non-tender to palpation. No midline neck or back TTP. No CVA or flank TTP.  CT renal stone study  shows a 4 mm UVJ stone on the right.  Urinalysis shows small hemoglobin and is nitrite and leukocyte negative.  No evidence of infection.    CBC shows mild leukocytosis with a white count of 12,000.  CMP shows a creatinine around 1.16.  This is around his baseline. CMP is otherwise unremarkable.  Patient received 2 L fluid bolus, morphine and 15 mg of Toradol.  At reevaluation prior to discharge she reports his pain has completely resolved.  He reports feeling back to normal and is ready to go home.  He has never had a kidney stone previously.  I encouraged him to follow-up with [facility].  Will discharge with prescriptions for Norco, Flomax and Zofran.  Patient was looked up on the [place] controlled substance database and there are no recent prescriptions for narcotic medicines.  I discussed narcotic medication precautions with the patient.  I discussed strict and specific return precautions. I advised the patient to follow-up with their primary care provider this week. I advised the patient to return to the emergency department with new or worsening symptoms or new concerns. The patient verbalized understanding and agreement with plan.      Final Clinical Impressions(s) / ED Diagnoses   Final diagnoses:  Right nephrolithiasis    ED Discharge Orders        Ordered    HYDROcodone-acetaminophen (NORCO/VICODIN) 5-325 MG tablet  Every 6 hours PRN     11/23/16 1911    tamsulosin (FLOMAX) 0.4 MG CAPS capsule  2 times daily     11/23/16 1911    ondansetron (ZOFRAN ODT) 4 MG disintegrating tablet  Every 8 hours PRN     11/23/16 1911       Waynetta Pean, Hershal Coria 11/23/16 Jac Canavan, MD  11/24/16 0008  

## 2016-11-23 NOTE — Progress Notes (Signed)
   Subjective:    Patient ID: Steve Burton, male    DOB: September 24, 1955, 61 y.o.   MRN: [id number]  HPI Chief Complaint  Patient presents with  . Back Pain    bruise on back today and very painful today,  pain also in sides and stomach area.  Left side numb from stroke and nausea.   He is a 61 year old male with a history of CVA and left sided paralysis, HTN who is here with complaints of acute onset of right flank pain radiating around to his RLQ.  Pain started at 9 am today. He has associated nausea. Voiding small frequent amount of urine.   Denies chest pain, palpitations, shortness of breath, abdominal pain, N/V/D.   Reviewed allergies, medications, past medical, surgical, and social history.    Review of Systems Pertinent positives and negatives in the history of present illness.     Objective:   Physical Exam BP (!) 182/110   Pulse 88   Temp 98.3 F (36.8 C) (Oral)  Visibly uncomfortable rocking back and forth in his wheelchair. Unable to tolerate an exam.       Assessment & Plan:  Acute right flank pain - Plan: POCT Urinalysis DIP (Proadvantage Device)  History of hemorrhagic cerebrovascular accident (CVA) with residual deficit  Essential hypertension  Left spastic hemiparesis (HCC)  Pain 10/10. Unable to get comfortable or tolerate exam. Suspicious for kidney stone without history of kidney stones.  HTN is typically well controlled.  His wife is with him. He does not have transportation and rides [employer].  Ambulance called and will transport him to the ED for further evaluation and treatment.

## 2016-11-23 NOTE — ED Triage Notes (Addendum)
Pt from home via ems with c/o right flank pain that began today at 0900. Pt had 100 mcg fentanyl via rt AC. Pt reports pressure with urination.

## 2016-12-07 DIAGNOSIS — R262 Difficulty in walking, not elsewhere classified: Secondary | ICD-10-CM | POA: Diagnosis not present

## 2016-12-14 ENCOUNTER — Other Ambulatory Visit: Payer: Self-pay | Admitting: Cardiology

## 2016-12-21 DIAGNOSIS — R262 Difficulty in walking, not elsewhere classified: Secondary | ICD-10-CM | POA: Diagnosis not present

## 2017-01-04 DIAGNOSIS — R262 Difficulty in walking, not elsewhere classified: Secondary | ICD-10-CM | POA: Diagnosis not present

## 2017-01-08 ENCOUNTER — Other Ambulatory Visit: Payer: Self-pay | Admitting: Family Medicine

## 2017-01-08 DIAGNOSIS — I10 Essential (primary) hypertension: Secondary | ICD-10-CM

## 2017-01-11 DIAGNOSIS — R262 Difficulty in walking, not elsewhere classified: Secondary | ICD-10-CM | POA: Diagnosis not present

## 2017-01-19 DIAGNOSIS — R262 Difficulty in walking, not elsewhere classified: Secondary | ICD-10-CM | POA: Diagnosis not present

## 2017-01-20 ENCOUNTER — Telehealth: Payer: Self-pay | Admitting: Family Medicine

## 2017-01-20 NOTE — Telephone Encounter (Signed)
Fax from [facility] rx cardizem CD cap  [facility]

## 2017-01-20 NOTE — Telephone Encounter (Signed)
Our office did not prescribe this med.  Thanks [employer]

## 2017-01-23 ENCOUNTER — Other Ambulatory Visit: Payer: Self-pay

## 2017-01-23 DIAGNOSIS — I10 Essential (primary) hypertension: Secondary | ICD-10-CM

## 2017-01-23 MED ORDER — DILTIAZEM HCL ER COATED BEADS 240 MG PO CP24: 240.0000 mg | ORAL_CAPSULE | Freq: Every day | ORAL | 0 refills | Status: DC

## 2017-01-23 NOTE — Telephone Encounter (Signed)
Looks like this medication was rx'd at ER visit, patient sees Vickie and does have appt with Dr.Crenshaw next month-is something that we can fill until that appt? Not sure that cardiology will actually fill a medication until they actually see the patient-please advise.

## 2017-01-23 NOTE — Telephone Encounter (Signed)
Last refilled for a year 11/2015 by Dr. Joseph Art.  Dr. Stanford Breed is his cardiologist (not new), and prescribes his labetolol. Refill request should likely go to him

## 2017-01-23 NOTE — Telephone Encounter (Signed)
Called [facility] and asked them to please send to his cardiologist Dr Stanford Breed to refill.

## 2017-01-23 NOTE — Telephone Encounter (Signed)
This is a BP med, please verify with provider since we are his pcp and the prescriber is urgent care.  We may be refilling this now.

## 2017-02-01 DIAGNOSIS — R262 Difficulty in walking, not elsewhere classified: Secondary | ICD-10-CM | POA: Diagnosis not present

## 2017-02-08 DIAGNOSIS — R262 Difficulty in walking, not elsewhere classified: Secondary | ICD-10-CM | POA: Diagnosis not present

## 2017-02-09 NOTE — Progress Notes (Signed)
HPI: FU hypertension and possible HOCM. Seen previously by Dr Domenic Polite for hypertension. Echo 4/11 showed hyperdynamic LV function, moderate LVH, intracavitary gradient of 4 m/s, grade 1 diastolic dysfunction, SAM with peak LVOT gradient of 60 mmHg. Carotid dopplers 4/11 showed no stenosis bilaterally. Cardiac MRI December 2016 showed normal LV function, focal basal septal hypertrophy, no SAM and hypertrophic cardiomyopathy could not be excluded but findings also could be consistent with hypertension. Last echo 2/18 showed vigorous LV systolic function; grade 1DD, mild LVH and mild LAE. Since last seen,  patient denies dyspnea, chest pain palpitations or syncope.  Current Outpatient Medications  Medication Sig Dispense Refill  . atorvastatin (LIPITOR) 10 MG tablet TAKE 1 TABLET BY MOUTH  DAILY 90 tablet 1  . baclofen (LIORESAL) 10 MG tablet TAKE 1 TABLET BY MOUTH 3  TIMES DAILY 90 tablet 1  . baclofen (LIORESAL) 10 MG tablet TAKE 1 TABLET BY MOUTH 3  TIMES DAILY 90 tablet 0  . diltiazem (CARDIZEM CD) 240 MG 24 hr capsule Take 1 capsule (240 mg total) by mouth daily. 30 capsule 0  . labetalol (NORMODYNE) 100 MG tablet TAKE 1 TABLET BY MOUTH TWO  TIMES DAILY 180 tablet 3  . labetalol (NORMODYNE) 200 MG tablet TAKE 1 TABLET BY MOUTH 2  TIMES DAILY. 180 tablet 0  . losartan (COZAAR) 100 MG tablet TAKE 1 TABLET BY MOUTH  DAILY 90 tablet 1  . Multiple Vitamin (MULTIVITAMIN) tablet Take 1 tablet by mouth daily.    . potassium chloride SA (K-DUR,KLOR-CON) 20 MEQ tablet TAKE 2 TABLETS BY MOUTH  DAILY 180 tablet 3  . spironolactone (ALDACTONE) 25 MG tablet TAKE 1 TABLET BY MOUTH  DAILY 90 tablet 3   No current facility-administered medications for this visit.      Past Medical History:  Diagnosis Date  . Elevated LDL cholesterol level   . Elevated PSA measurement 11/22/2016  . Hypertension   . Impaired fasting glucose 11/22/2016  . Stroke [facility]) 2011    Past Surgical History:  Procedure  Laterality Date  . CATARACT EXTRACTION  2006   right eye  . HERNIA REPAIR  2005    Social History   Socioeconomic History  . Marital status: Married    Spouse name: Not on file  . Number of children: Not on file  . Years of education: Not on file  . Highest education level: Not on file  Social Needs  . Financial resource strain: Not on file  . Food insecurity - worry: Not on file  . Food insecurity - inability: Not on file  . Transportation needs - medical: Not on file  . Transportation needs - non-medical: Not on file  Occupational History  . Not on file  Tobacco Use  . Smoking status: Never Smoker  . Smokeless tobacco: Never Used  Substance and Sexual Activity  . Alcohol use: Yes    Alcohol/week: 0.0 oz    Comment: 1 glass of wine per week.   . Drug use: No  . Sexual activity: Not on file  Other Topics Concern  . Not on file  Social History Narrative  . Not on file    Family History  Problem Relation Age of Onset  . Stroke Mother   . Heart disease Father        MI at age 85  . Epilepsy Sister   . Colon cancer Neg Hx     ROS: no fevers or chills, productive cough, hemoptysis, dysphasia,  odynophagia, melena, hematochezia, dysuria, hematuria, rash, seizure activity, orthopnea, PND, pedal edema, claudication. Remaining systems are negative.  Physical Exam: Well-developed well-nourished in no acute distress.  Skin is warm and dry.  HEENT is normal.  Neck is supple.  Chest is clear to auscultation with normal expansion.  Cardiovascular exam is regular rate and rhythm.  Abdominal exam nontender or distended. No masses palpated. Extremities show no edema. neuro left side weakness from prior CVA  ECG-sinus rhythm at a rate of 60.  Nonspecific ST changes; personally reviewed  A/P  1 hypertrophic cardiomyopathy-previous echocardiogram suggestive of HCM.  Cardiac MRI suggested findings could be consistent with hypertension but hypertrophic cardiomyopathy could not  be excluded.  Given long history of hypertension this is more likely to be a hypertensive cardiomyopathy.  2 hypertension-blood pressure is mildly elevated.  However they follow this at home and it is typically controlled in general.  Continue present medications and follow.  3 Hyperlipidemia-continue statin; per primary care.   Kirk Ruths, MD

## 2017-02-14 ENCOUNTER — Other Ambulatory Visit: Payer: Self-pay | Admitting: Family Medicine

## 2017-02-14 NOTE — Telephone Encounter (Signed)
ok 

## 2017-02-14 NOTE — Telephone Encounter (Signed)
Is this okay to refill? 

## 2017-02-16 DIAGNOSIS — R262 Difficulty in walking, not elsewhere classified: Secondary | ICD-10-CM | POA: Diagnosis not present

## 2017-02-21 ENCOUNTER — Ambulatory Visit: Payer: Medicare Other | Admitting: Cardiology

## 2017-02-21 ENCOUNTER — Encounter: Payer: Self-pay | Admitting: Cardiology

## 2017-02-21 VITALS — BP 142/88 | HR 60 | Ht 72.0 in | Wt 189.0 lb

## 2017-02-21 DIAGNOSIS — I421 Obstructive hypertrophic cardiomyopathy: Secondary | ICD-10-CM | POA: Diagnosis not present

## 2017-02-21 DIAGNOSIS — E7800 Pure hypercholesterolemia, unspecified: Secondary | ICD-10-CM

## 2017-02-21 DIAGNOSIS — I10 Essential (primary) hypertension: Secondary | ICD-10-CM

## 2017-02-21 NOTE — Patient Instructions (Signed)
Your physician wants you to follow-up in: ONE YEAR WITH DR CRENSHAW You will receive a reminder letter in the mail two months in advance. If you don't receive a letter, please call our office to schedule the follow-up appointment.   If you need a refill on your cardiac medications before your next appointment, please call your pharmacy.  

## 2017-02-22 DIAGNOSIS — R262 Difficulty in walking, not elsewhere classified: Secondary | ICD-10-CM | POA: Diagnosis not present

## 2017-02-28 ENCOUNTER — Encounter: Payer: Self-pay | Admitting: Cardiology

## 2017-03-01 ENCOUNTER — Other Ambulatory Visit: Payer: Self-pay | Admitting: *Deleted

## 2017-03-01 DIAGNOSIS — R262 Difficulty in walking, not elsewhere classified: Secondary | ICD-10-CM | POA: Diagnosis not present

## 2017-03-01 DIAGNOSIS — I10 Essential (primary) hypertension: Secondary | ICD-10-CM

## 2017-03-01 MED ORDER — LABETALOL HCL 100 MG PO TABS: 100.0000 mg | ORAL_TABLET | Freq: Two times a day (BID) | ORAL | 3 refills | Status: DC

## 2017-03-08 DIAGNOSIS — R262 Difficulty in walking, not elsewhere classified: Secondary | ICD-10-CM | POA: Diagnosis not present

## 2017-03-15 DIAGNOSIS — R262 Difficulty in walking, not elsewhere classified: Secondary | ICD-10-CM | POA: Diagnosis not present

## 2017-03-17 ENCOUNTER — Other Ambulatory Visit: Payer: Self-pay | Admitting: Cardiology

## 2017-03-17 ENCOUNTER — Other Ambulatory Visit: Payer: Self-pay | Admitting: Family Medicine

## 2017-03-17 DIAGNOSIS — I10 Essential (primary) hypertension: Secondary | ICD-10-CM

## 2017-03-17 NOTE — Telephone Encounter (Signed)
Ok to send

## 2017-03-17 NOTE — Telephone Encounter (Signed)
Please advise if baclofan can be sent [facility] . Please advise. Thanks [employer]

## 2017-03-22 DIAGNOSIS — R262 Difficulty in walking, not elsewhere classified: Secondary | ICD-10-CM | POA: Diagnosis not present

## 2017-03-29 DIAGNOSIS — R262 Difficulty in walking, not elsewhere classified: Secondary | ICD-10-CM | POA: Diagnosis not present

## 2017-03-30 ENCOUNTER — Encounter: Payer: Self-pay | Admitting: Family Medicine

## 2017-03-30 ENCOUNTER — Ambulatory Visit (INDEPENDENT_AMBULATORY_CARE_PROVIDER_SITE_OTHER): Payer: Medicare Other | Admitting: Family Medicine

## 2017-03-30 VITALS — BP 120/70 | HR 60 | Wt 180.6 lb

## 2017-03-30 DIAGNOSIS — I10 Essential (primary) hypertension: Secondary | ICD-10-CM | POA: Diagnosis not present

## 2017-03-30 DIAGNOSIS — R972 Elevated prostate specific antigen [PSA]: Secondary | ICD-10-CM | POA: Diagnosis not present

## 2017-03-30 DIAGNOSIS — G8114 Spastic hemiplegia affecting left nondominant side: Secondary | ICD-10-CM

## 2017-03-30 NOTE — Progress Notes (Addendum)
   Subjective:    Patient ID: Steve Burton, male    DOB: 1955-09-24, 62 y.o.   MRN: [id number]  HPI Chief Complaint  Patient presents with  . form completion   He is here to follow up on abnormal labs and HTN. He also has a form for his insurance company that he would like for me to complete.   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 He was previously left hand dominant.  He is able to walk a few steps with a cane. He is using a wheelchair for mobility in the office. States he took the [employer] transportation.   Cardiologist - Dr. Stanford Breed.   HTN - reports good compliance and no issues. Checks BP at home and it is in normal range.   Statin- taking this daily and no side effects.   OT for weakness and reports benefiting from this.    Baclofen for spasticity- states he can tell if he does not take it that he has more issues with his left arm.   PSA was elevated in November and he states that he recalls discussing this the day he came in for a kidney stone but has not returned to discuss this since. PSA was 4.7 and then 6.6 from the same tube ironically. He has seen urology in the distant past.  This needs repeated today.   Reviewed allergies, medications, past medical, surgical, family, and social history.  Review of Systems Review of Systems Constitutional: -fever, -chills, -sweats, -unexpected weight change,-fatigue ENT: -runny nose, -ear pain, -sore throat Cardiology:  -chest pain, -palpitations, -edema Respiratory: -cough, -shortness of breath, -wheezing Gastroenterology: -abdominal pain, -nausea, -vomiting, -diarrhea, -constipation  Hematology: -bleeding or bruising problems Musculoskeletal:  -joint swelling, -back pain Urology: -dysuria, -difficulty urinating, -hematuria, -urinary frequency, -urgency Neurology: -headache,  -tingling, -numbness        Objective:   Physical Exam BP 120/70   Pulse 60    Wt 180 lb 9.6 oz (81.9 kg)   BMI 24.49 kg/m  Alert and oriented and in no acute distress. In a wheelchair. Pulmonary effort normal. Skin warm and dry, no pallor or rash. Left upper and lower extremity spasticity and weakness.       Assessment & Plan:  Essential hypertension - Plan: CBC with Differential/Platelet, Comprehensive metabolic panel  Left spastic hemiparesis (HCC)  Elevated PSA measurement - Plan: PSA, total and free  Discussed that his BP is well controlled. Continue on current medications.  He will spot check his BP at home and let me or his cardiologist know if his readings are elevated.  Appears to be doing fine on statin. Continue this.  Benefiting from OT and will complete the course. Continue on baclofen for spasticity.  Will recheck PSA today and refer to urology if needed.

## 2017-03-31 ENCOUNTER — Other Ambulatory Visit: Payer: Self-pay | Admitting: Family Medicine

## 2017-03-31 DIAGNOSIS — R972 Elevated prostate specific antigen [PSA]: Secondary | ICD-10-CM

## 2017-03-31 LAB — COMPREHENSIVE METABOLIC PANEL
A/G RATIO: 1.9 (ref 1.2–2.2)
ALT: 15 IU/L (ref 0–44)
AST: 12 IU/L (ref 0–40)
Albumin: 4.4 g/dL (ref 3.5–5.5)
Alkaline Phosphatase: 94 IU/L (ref 39–117)
BILIRUBIN TOTAL: 0.3 mg/dL (ref 0.0–1.2)
BUN/Creatinine Ratio: 16 (ref 9–20)
BUN: 15 mg/dL (ref 6–24)
CHLORIDE: 105 mmol/L (ref 96–106)
CO2: 27 mmol/L (ref 20–29)
Calcium: 9.8 mg/dL (ref 8.7–10.2)
Creatinine, Ser: 0.95 mg/dL (ref 0.76–1.27)
GFR calc Af Amer: 101 mL/min/{1.73_m2} (ref >59)
GFR calc non Af Amer: 87 mL/min/{1.73_m2} (ref >59)
Globulin, Total: 2.3 g/dL (ref 1.5–4.5)
Glucose: 119 mg/dL — ABNORMAL HIGH (ref 65–99)
POTASSIUM: 4.4 mmol/L (ref 3.5–5.2)
Sodium: 146 mmol/L — ABNORMAL HIGH (ref 134–144)
Total Protein: 6.7 g/dL (ref 6.0–8.5)

## 2017-03-31 LAB — CBC WITH DIFFERENTIAL/PLATELET
BASOS ABS: 0 10*3/uL (ref 0.0–0.2)
BASOS: 1 %
EOS (ABSOLUTE): 0.3 10*3/uL (ref 0.0–0.4)
Eos: 4 %
Hematocrit: 37.6 % (ref 37.5–51.0)
Hemoglobin: 12.6 g/dL — ABNORMAL LOW (ref 13.0–17.7)
IMMATURE GRANULOCYTES: 0 %
Immature Grans (Abs): 0 10*3/uL (ref 0.0–0.1)
Lymphocytes Absolute: 1.7 10*3/uL (ref 0.7–3.1)
Lymphs: 24 %
MCH: 28.7 pg (ref 26.6–33.0)
MCHC: 33.5 g/dL (ref 31.5–35.7)
MCV: 86 fL (ref 79–97)
MONOS ABS: 0.7 10*3/uL (ref 0.1–0.9)
Monocytes: 9 %
NEUTROS PCT: 62 %
Neutrophils Absolute: 4.4 10*3/uL (ref 1.4–7.0)
PLATELETS: 193 10*3/uL (ref 150–379)
RBC: 4.39 x10E6/uL (ref 4.14–5.80)
RDW: 13.7 % (ref 12.3–15.4)
WBC: 7 10*3/uL (ref 3.4–10.8)

## 2017-03-31 LAB — PSA, TOTAL AND FREE
PROSTATE SPECIFIC AG, SERUM: 6.5 ng/mL — AB (ref 0.0–4.0)
PSA FREE PCT: 12 %
PSA FREE: 0.78 ng/mL

## 2017-04-04 ENCOUNTER — Other Ambulatory Visit: Payer: Self-pay | Admitting: Cardiology

## 2017-04-04 NOTE — Telephone Encounter (Signed)
Rx has been sent to the pharmacy electronically. ° °

## 2017-04-05 DIAGNOSIS — R262 Difficulty in walking, not elsewhere classified: Secondary | ICD-10-CM | POA: Diagnosis not present

## 2017-04-12 DIAGNOSIS — R262 Difficulty in walking, not elsewhere classified: Secondary | ICD-10-CM | POA: Diagnosis not present

## 2017-04-14 ENCOUNTER — Encounter: Payer: Self-pay | Admitting: Internal Medicine

## 2017-04-19 DIAGNOSIS — R262 Difficulty in walking, not elsewhere classified: Secondary | ICD-10-CM | POA: Diagnosis not present

## 2017-04-24 ENCOUNTER — Encounter: Payer: Self-pay | Admitting: Family Medicine

## 2017-04-26 DIAGNOSIS — R262 Difficulty in walking, not elsewhere classified: Secondary | ICD-10-CM | POA: Diagnosis not present

## 2017-05-02 DIAGNOSIS — R972 Elevated prostate specific antigen [PSA]: Secondary | ICD-10-CM | POA: Diagnosis not present

## 2017-05-03 DIAGNOSIS — R262 Difficulty in walking, not elsewhere classified: Secondary | ICD-10-CM | POA: Diagnosis not present

## 2017-05-10 ENCOUNTER — Other Ambulatory Visit: Payer: Self-pay | Admitting: Family Medicine

## 2017-05-10 ENCOUNTER — Other Ambulatory Visit: Payer: Self-pay

## 2017-05-10 DIAGNOSIS — I10 Essential (primary) hypertension: Secondary | ICD-10-CM

## 2017-05-10 MED ORDER — LOSARTAN POTASSIUM 100 MG PO TABS: 100.0000 mg | ORAL_TABLET | Freq: Every day | ORAL | 1 refills | Status: DC

## 2017-05-10 NOTE — Telephone Encounter (Signed)
Is this okay to refill? 

## 2017-05-10 NOTE — Telephone Encounter (Signed)
ok 

## 2017-05-15 ENCOUNTER — Other Ambulatory Visit: Payer: Self-pay | Admitting: Cardiology

## 2017-05-15 DIAGNOSIS — I10 Essential (primary) hypertension: Secondary | ICD-10-CM

## 2017-05-16 ENCOUNTER — Encounter: Payer: Self-pay | Admitting: Cardiology

## 2017-05-16 MED ORDER — LABETALOL HCL 200 MG PO TABS: 200.0000 mg | ORAL_TABLET | Freq: Two times a day (BID) | ORAL | 3 refills | Status: DC

## 2017-05-16 MED ORDER — LABETALOL HCL 100 MG PO TABS: 100.0000 mg | ORAL_TABLET | Freq: Two times a day (BID) | ORAL | 3 refills | Status: DC

## 2017-05-16 NOTE — Telephone Encounter (Signed)
Refill sent to the pharmacy electronically.  

## 2017-07-09 ENCOUNTER — Other Ambulatory Visit: Payer: Self-pay | Admitting: Family Medicine

## 2017-07-10 NOTE — Telephone Encounter (Signed)
Is this okay to refill? 

## 2017-07-10 NOTE — Telephone Encounter (Signed)
[facility]illed [facility]

## 2017-07-10 NOTE — Telephone Encounter (Signed)
ok 

## 2017-08-08 ENCOUNTER — Encounter: Payer: Self-pay | Admitting: Cardiology

## 2017-08-08 ENCOUNTER — Encounter: Payer: Self-pay | Admitting: Family Medicine

## 2017-08-09 ENCOUNTER — Other Ambulatory Visit: Payer: Self-pay | Admitting: *Deleted

## 2017-08-09 DIAGNOSIS — I10 Essential (primary) hypertension: Secondary | ICD-10-CM

## 2017-08-09 MED ORDER — LABETALOL HCL 200 MG PO TABS: 200.0000 mg | ORAL_TABLET | Freq: Two times a day (BID) | ORAL | 3 refills | Status: DC

## 2017-08-09 MED ORDER — LABETALOL HCL 100 MG PO TABS: 100.0000 mg | ORAL_TABLET | Freq: Two times a day (BID) | ORAL | 3 refills | Status: DC

## 2017-08-14 ENCOUNTER — Encounter: Payer: Self-pay | Admitting: Family Medicine

## 2017-08-14 ENCOUNTER — Encounter: Payer: Self-pay | Admitting: Internal Medicine

## 2017-08-14 DIAGNOSIS — R262 Difficulty in walking, not elsewhere classified: Secondary | ICD-10-CM | POA: Diagnosis not present

## 2017-08-22 DIAGNOSIS — R262 Difficulty in walking, not elsewhere classified: Secondary | ICD-10-CM | POA: Diagnosis not present

## 2017-09-01 DIAGNOSIS — R262 Difficulty in walking, not elsewhere classified: Secondary | ICD-10-CM | POA: Diagnosis not present

## 2017-09-06 DIAGNOSIS — R262 Difficulty in walking, not elsewhere classified: Secondary | ICD-10-CM | POA: Diagnosis not present

## 2017-09-08 DIAGNOSIS — R262 Difficulty in walking, not elsewhere classified: Secondary | ICD-10-CM | POA: Diagnosis not present

## 2017-09-09 ENCOUNTER — Other Ambulatory Visit: Payer: Self-pay | Admitting: Family Medicine

## 2017-09-09 DIAGNOSIS — I10 Essential (primary) hypertension: Secondary | ICD-10-CM

## 2017-09-12 DIAGNOSIS — R262 Difficulty in walking, not elsewhere classified: Secondary | ICD-10-CM | POA: Diagnosis not present

## 2017-09-15 DIAGNOSIS — R262 Difficulty in walking, not elsewhere classified: Secondary | ICD-10-CM | POA: Diagnosis not present

## 2017-09-26 ENCOUNTER — Other Ambulatory Visit: Payer: Self-pay | Admitting: Medical

## 2017-10-18 ENCOUNTER — Encounter: Payer: Self-pay | Admitting: Family Medicine

## 2017-10-18 MED ORDER — NYSTATIN-TRIAMCINOLONE 100000-0.1 UNIT/GM-% EX CREA: 1.0000 "application " | TOPICAL_CREAM | Freq: Two times a day (BID) | CUTANEOUS | 0 refills | Status: DC

## 2017-10-19 ENCOUNTER — Telehealth: Payer: Self-pay | Admitting: Family Medicine

## 2017-10-19 MED ORDER — CLOTRIMAZOLE-BETAMETHASONE 1-0.05 % EX CREA: 1.0000 "application " | TOPICAL_CREAM | Freq: Two times a day (BID) | CUTANEOUS | 0 refills | Status: DC

## 2017-10-19 NOTE — Telephone Encounter (Signed)
Per laura, J[facility] said to send in clotrimazole-betamethasone twice a day

## 2017-10-19 NOTE — Telephone Encounter (Signed)
Pt states Nystatin requiring P.A. This was completed and insurance will not pay for this without failure of 2 preferred  Please switch to one of the following Ciclopirox, Ciclopirox olamine, Clotrimazole, Clotrimazole betamethasone, Econazole, Exelderm, Ketoconazole, Mentax, Naftin gel or cream or Oxistat lotion or cream

## 2017-10-23 ENCOUNTER — Other Ambulatory Visit: Payer: Self-pay | Admitting: Family Medicine

## 2017-10-23 MED ORDER — DOXYCYCLINE HYCLATE 100 MG PO TABS: 100.0000 mg | ORAL_TABLET | Freq: Two times a day (BID) | ORAL | 0 refills | Status: DC

## 2017-11-09 ENCOUNTER — Encounter: Payer: Self-pay | Admitting: Internal Medicine

## 2017-11-15 ENCOUNTER — Other Ambulatory Visit: Payer: Self-pay | Admitting: Family Medicine

## 2017-11-15 NOTE — Telephone Encounter (Signed)
Is this okay to refill? 

## 2018-01-30 ENCOUNTER — Other Ambulatory Visit: Payer: Self-pay | Admitting: Family Medicine

## 2018-01-30 ENCOUNTER — Other Ambulatory Visit: Payer: Self-pay | Admitting: Cardiology

## 2018-01-30 DIAGNOSIS — I10 Essential (primary) hypertension: Secondary | ICD-10-CM

## 2018-01-30 NOTE — Telephone Encounter (Signed)
Is this okay to refill? 

## 2018-02-06 DIAGNOSIS — D225 Melanocytic nevi of trunk: Secondary | ICD-10-CM | POA: Diagnosis not present

## 2018-02-06 DIAGNOSIS — L304 Erythema intertrigo: Secondary | ICD-10-CM | POA: Diagnosis not present

## 2018-02-06 DIAGNOSIS — D2262 Melanocytic nevi of left upper limb, including shoulder: Secondary | ICD-10-CM | POA: Diagnosis not present

## 2018-02-06 DIAGNOSIS — D229 Melanocytic nevi, unspecified: Secondary | ICD-10-CM | POA: Diagnosis not present

## 2018-02-06 DIAGNOSIS — C4362 Malignant melanoma of left upper limb, including shoulder: Secondary | ICD-10-CM | POA: Diagnosis not present

## 2018-02-06 DIAGNOSIS — D485 Neoplasm of uncertain behavior of skin: Secondary | ICD-10-CM | POA: Diagnosis not present

## 2018-02-06 DIAGNOSIS — D1723 Benign lipomatous neoplasm of skin and subcutaneous tissue of right leg: Secondary | ICD-10-CM | POA: Diagnosis not present

## 2018-02-06 DIAGNOSIS — D0362 Melanoma in situ of left upper limb, including shoulder: Secondary | ICD-10-CM | POA: Diagnosis not present

## 2018-02-26 NOTE — Progress Notes (Signed)
HPI: FU hypertension and possibleHOCM.Seen previously by Dr Domenic Polite for hypertension. Echo 4/11 showed hyperdynamic LV function, moderate LVH, intracavitary gradient of 4 m/s, grade 1 diastolic dysfunction, SAM with peak LVOT gradient of 60 mmHg. Carotid dopplers 4/11 showed no stenosis bilaterally. Cardiac MRI December 2016 showed normal LV function, focal basal septal hypertrophy, no SAMand hypertrophic cardiomyopathy could not be excluded but findings also could be consistent with hypertension.Last echo 2/18 showed vigorous LV systolic function; grade 1DD, mild LVH and mild LAE. Since last seen,there is no dyspnea, chest pain, palpitations or syncope.  Current Outpatient Medications  Medication Sig Dispense Refill  . atorvastatin (LIPITOR) 10 MG tablet TAKE 1 TABLET BY MOUTH  DAILY 90 tablet 0  . baclofen (LIORESAL) 10 MG tablet TAKE 1 TABLET BY MOUTH 3  TIMES DAILY 270 tablet 0  . diltiazem (CARDIZEM CD) 240 MG 24 hr capsule TAKE 1 CAPSULE BY MOUTH  DAILY 90 capsule 0  . labetalol (NORMODYNE) 100 MG tablet Take 1 tablet (100 mg total) by mouth 2 (two) times daily. 180 tablet 3  . labetalol (NORMODYNE) 200 MG tablet Take 1 tablet (200 mg total) by mouth 2 (two) times daily. 180 tablet 3  . losartan (COZAAR) 100 MG tablet TAKE 1 TABLET BY MOUTH  DAILY 90 tablet 1  . Multiple Vitamin (MULTIVITAMIN) tablet Take 1 tablet by mouth daily.    . potassium chloride SA (K-DUR,KLOR-CON) 20 MEQ tablet TAKE 2 TABLETS BY MOUTH  DAILY 180 tablet 3  . spironolactone (ALDACTONE) 25 MG tablet TAKE 1 TABLET BY MOUTH  DAILY 90 tablet 3   No current facility-administered medications for this visit.      Past Medical History:  Diagnosis Date  . Elevated LDL cholesterol level   . Elevated PSA measurement 11/22/2016  . Hypertension   . Impaired fasting glucose 11/22/2016  . Stroke [facility]) 2011    Past Surgical History:  Procedure Laterality Date  . CATARACT EXTRACTION  2006   right eye  .  HERNIA REPAIR  2005    Social History   Socioeconomic History  . Marital status: Married    Spouse name: Not on file  . Number of children: Not on file  . Years of education: Not on file  . Highest education level: Not on file  Occupational History  . Not on file  Social Needs  . Financial resource strain: Not on file  . Food insecurity:    Worry: Not on file    Inability: Not on file  . Transportation needs:    Medical: Not on file    Non-medical: Not on file  Tobacco Use  . Smoking status: Never Smoker  . Smokeless tobacco: Never Used  Substance and Sexual Activity  . Alcohol use: Yes    Alcohol/week: 0.0 standard drinks    Comment: 1 glass of wine per week.   . Drug use: No  . Sexual activity: Not on file  Lifestyle  . Physical activity:    Days per week: Not on file    Minutes per session: Not on file  . Stress: Not on file  Relationships  . Social connections:    Talks on phone: Not on file    Gets together: Not on file    Attends religious service: Not on file    Active member of club or organization: Not on file    Attends meetings of clubs or organizations: Not on file    Relationship status: Not on  file  . Intimate partner violence:    Fear of current or ex partner: Not on file    Emotionally abused: Not on file    Physically abused: Not on file    Forced sexual activity: Not on file  Other Topics Concern  . Not on file  Social History Narrative  . Not on file    Family History  Problem Relation Age of Onset  . Stroke Mother   . Heart disease Father        MI at age 14  . Epilepsy Sister   . Colon cancer Neg Hx     ROS: no fevers or chills, productive cough, hemoptysis, dysphasia, odynophagia, melena, hematochezia, dysuria, hematuria, rash, seizure activity, orthopnea, PND, pedal edema, claudication. Remaining systems are negative.  Physical Exam: Well-developed well-nourished in no acute distress.  Skin is warm and dry.  HEENT is normal.    Neck is supple.  Chest is clear to auscultation with normal expansion.  Cardiovascular exam is regular rate and rhythm.  Abdominal exam nontender or distended. No masses palpated. Extremities show no edema. neuro residual left-sided weakness from prior CVA  ECG-normal sinus rhythm, nonspecific ST changes.  Personally reviewed  A/P  1 possible hypertrophic cardiomyopathy-previous echocardiogram suggested hypertrophic cardiomyopathy but follow-up echocardiogram and cardiac MRI suggest more likely LVH is hypertensive related.  Given longstanding hypertension I think this is more likely.  Continue blood pressure control.  2 hypertension-blood pressure is controlled today.  Continue present medications and follow.  Check potassium and renal function.  3 hyperlipidemia-continue statin.  Lipids and liver.  Kirk Ruths, MD

## 2018-03-02 ENCOUNTER — Ambulatory Visit: Payer: Medicare Other | Admitting: Cardiology

## 2018-03-02 ENCOUNTER — Encounter: Payer: Self-pay | Admitting: Cardiology

## 2018-03-02 VITALS — BP 138/82 | HR 78 | Ht 72.0 in | Wt 184.0 lb

## 2018-03-02 DIAGNOSIS — I421 Obstructive hypertrophic cardiomyopathy: Secondary | ICD-10-CM | POA: Diagnosis not present

## 2018-03-02 DIAGNOSIS — E7800 Pure hypercholesterolemia, unspecified: Secondary | ICD-10-CM | POA: Diagnosis not present

## 2018-03-02 DIAGNOSIS — I10 Essential (primary) hypertension: Secondary | ICD-10-CM | POA: Diagnosis not present

## 2018-03-02 NOTE — Patient Instructions (Signed)
Medication Instructions:  NO CHANGE If you need a refill on your cardiac medications before your next appointment, please call your pharmacy.   Lab work: Your physician recommends that you return for lab work PRIOR TO EATING If you have labs (blood work) drawn today and your tests are completely normal, you will receive your results only by: . MyChart Message (if you have MyChart) OR . A paper copy in the mail If you have any lab test that is abnormal or we need to change your treatment, we will call you to review the results.  Follow-Up: At [facility], you and your health needs are our priority.  As part of our continuing mission to provide you with exceptional heart care, we have created designated Provider Care Teams.  These Care Teams include your primary Cardiologist (physician) and Advanced Practice Providers (APPs -  Physician Assistants and Nurse Practitioners) who all work together to provide you with the care you need, when you need it. You will need a follow up appointment in 12 months.  Please call our office 2 months in advance to schedule this appointment.  You may see Daryon CRENSHAW MD or one of the following Advanced Practice Providers on your designated Care Team:   Luke Kilroy, PA-C Krista Kroeger, PA-C . Callie Goodrich, PA-C   

## 2018-04-04 ENCOUNTER — Other Ambulatory Visit: Payer: Self-pay | Admitting: Cardiology

## 2018-04-04 ENCOUNTER — Other Ambulatory Visit: Payer: Self-pay | Admitting: Family Medicine

## 2018-04-04 DIAGNOSIS — I10 Essential (primary) hypertension: Secondary | ICD-10-CM

## 2018-04-04 NOTE — Telephone Encounter (Signed)
Pt has an virtual appt tomorrow

## 2018-04-04 NOTE — Telephone Encounter (Signed)
Refill generic aldactone.

## 2018-04-05 ENCOUNTER — Ambulatory Visit (INDEPENDENT_AMBULATORY_CARE_PROVIDER_SITE_OTHER): Payer: Medicare Other | Admitting: Family Medicine

## 2018-04-05 ENCOUNTER — Other Ambulatory Visit: Payer: Self-pay

## 2018-04-05 ENCOUNTER — Encounter: Payer: Self-pay | Admitting: Family Medicine

## 2018-04-05 VITALS — BP 114/72 | Wt 180.0 lb

## 2018-04-05 DIAGNOSIS — I6930 Unspecified sequelae of cerebral infarction: Secondary | ICD-10-CM

## 2018-04-05 DIAGNOSIS — Z0000 Encounter for general adult medical examination without abnormal findings: Secondary | ICD-10-CM | POA: Diagnosis not present

## 2018-04-05 DIAGNOSIS — R972 Elevated prostate specific antigen [PSA]: Secondary | ICD-10-CM | POA: Diagnosis not present

## 2018-04-05 DIAGNOSIS — E7800 Pure hypercholesterolemia, unspecified: Secondary | ICD-10-CM

## 2018-04-05 DIAGNOSIS — G8114 Spastic hemiplegia affecting left nondominant side: Secondary | ICD-10-CM

## 2018-04-05 DIAGNOSIS — I10 Essential (primary) hypertension: Secondary | ICD-10-CM | POA: Diagnosis not present

## 2018-04-05 MED ORDER — LOSARTAN POTASSIUM 100 MG PO TABS: 100.0000 mg | ORAL_TABLET | Freq: Every day | ORAL | 1 refills | Status: DC

## 2018-04-05 NOTE — Addendum Note (Signed)
Addended by: Minette Headland A on: 04/05/2018 10:34 AM   Modules accepted: Orders

## 2018-04-05 NOTE — Patient Instructions (Signed)
MEDICARE PREVENTATIVE SERVICES (MALE) AND PERSONALIZED PLAN for  Steve Burton April 05, 2018   SPECIFIC RECOMMENDATIONS: Continue on your current medications and continue checking your blood pressures regularly.  Let me know if your readings are consistently higher than 130/80.  Continue eating a low-sodium diet. Stay as active as possible as long as you keeping your social distance.  I recommend that you wait until the current virus situation has calmed down and then get your blood work.  Dr. Stanford Breed did order this for you to be done at his office.  I will see you back in the office in 6 months or sooner if needed.  Influenza vaccine: due again in the fall Pneumococcal vaccine: not due Shingles vaccine: you can check with your insurance carrier to see if this is affordable Tdap vaccine: up to date Colonoscopy: up to date. Due again in 2026   Return in 6 months.    GENERAL RECOMMENDATIONS FOR GOOD HEALTH:  Supplements:   Take a daily baby Aspirin 70m at bedtime for heart health unless you have a history of gastrointestinal bleed, allergy to aspirin, or are already taking higher dose Aspirin or other antiplatelet or blood thinner medication.    Consume 1200 mg of Calcium daily through dietary calcium or supplement if you are male age 6975or older, or men 765and older.   Men aged 515-70should consume 1000 mg of Calcium daily.  Take 600 IU of Vitamin D daily.  Take 800 IU of Calcium daily if you are older than age 61   Take a general multivitamin daily.   Healthy diet: Eat a variety of foods, including fruits, vegetables, vegetable protein such as beans, lentils, tofu, and grains, such as rice.  Limit meat or animal protein, but if you eat meat, choose leans cuts such as chicken, fish, or [place]  Drink plenty of water daily.  Decrease saturated fat in the diet, avoid lots of red meat, processed foods, sweets, fast foods, and fried foods.  Limit salt and caffeine  intake.  Exercise: Aerobic exercise helps maintain good heart health. Weight bearing exercise helps keep bones and muscles working strong.  We recommend at least 30-40 minutes of exercise most days of the week.   Fall prevention: Falls are the leading cause of injuries, accidents, and accidental deaths in people over the age of 673 Falling is a real threat to your ability to live on your own.  Causes include poor eyesight or poor hearing, illness, poor lighting, throw rugs, clutter in your home, and medication side effects causing dizziness or balance problems.  Such medications can include medications for depression, sleep problems, high blood pressure, diabetes, and heart conditions.   PREVENTION  Be sure your home is as safe as possible. Here are some tips:  Wear shoes with non-skid soles (not house slippers).   Be sure your home and outside area are well lit.   Use night lights throughout your house, including hallways and stairways.   Remove clutter and clean up spills on floors and walkways.   Remove throw rugs or fasten them to the floor with carpet tape. Tack down carpet edges.   Do not place electrical cords across pathways.   Install grab bars in your bathtub, shower, and toilet area. Towel bars should not be used as a grab bar.   Install handrails on both sides of stairways.   Do not climb on stools or stepladders. Get someone else to help with jobs that require climbing.  Do not wax your floors at all, or use a non-skid wax.   Repair uneven or unsafe sidewalks, walkways or stairs.   Keep frequently used items within reach.   Be aware of pets so you do not trip.  Get regular check-ups from your doctor, and take good care of yourself:  Have your eyes checked every year for vision changes, cataracts, glaucoma, and other eye problems. Wear eyeglasses as directed.   Have your hearing checked every 2 years, or anytime you or others think that you cannot hear well. Use  hearing aids as directed.   See your caregiver if you have foot pain or corns. Sore feet can contribute to falls.   Let your caregiver know if a medicine is making you feel dizzy or making you lose your balance.   Use a cane, walker, or wheelchair as directed. Use walker or wheelchair brakes when getting in and out.   When you get up from bed, sit on the side of the bed for 1 to 2 minutes before you stand up. This will give your blood pressure time to adjust, and you will feel less dizzy.   If you need to go to the bathroom often, consider using a bedside commode.  Disease prevention:  If you smoke or chew tobacco, find out from your caregiver how to quit. It can literally save your life, no matter how long you have been a tobacco user. If you do not use tobacco, never begin. Medicare does cover some smoking cessation counseling.  Maintain a healthy diet and normal weight. Increased weight leads to problems with blood pressure and diabetes. We check your height, weight, and BMI as part of your yearly visit.  The Body Mass Index or BMI is a way of measuring how much of your body is fat. Having a BMI above 27 increases the risk of heart disease, diabetes, hypertension, stroke and other problems related to obesity. Your caregiver can help determine your BMI and based on it develop an exercise and dietary program to help you achieve or maintain this important measurement at a healthful level.  High blood pressure causes heart and blood vessel problems.  Persistent high blood pressure should be treated with medicine if weight loss and exercise do not work.  We check your blood pressure as part of your yearly visit.  Avoid drinking alcohol in excess (more than two drinks per day).  Avoid use of street drugs. Do not share needles with anyone. Ask for professional help if you need assistance or instructions on stopping the use of alcohol, cigarettes, and/or drugs.  Brush your teeth twice a day with  fluoride toothpaste, and floss once a day. Good oral hygiene prevents tooth decay and gum disease. The problems can be painful, unattractive, and can cause other health problems. Visit your dentist for a routine oral and dental checkup and preventive care every 6-12 months.   See your eye doctor yearly for routine screening for things like glaucoma.  Look at your skin regularly.  Use a mirror to look at your back. Notify your caregivers of changes in moles, especially if there are changes in shapes, colors, a size larger than a pencil eraser, an irregular border, or development of new moles.  Safety:  Use seatbelts 100% of the time, whether driving or as a passenger.  Use safety devices such as hearing protection if you work in environments with loud noise or significant background noise.  Use safety glasses when doing any work  that could send debris in to the eyes.  Use a helmet if you ride a bike or motorcycle.  Use appropriate safety gear for contact sports.  Talk to your caregiver about gun safety.  Use sunscreen with a SPF (or skin protection factor) of 15 or greater.  Lighter skinned people are at a greater risk of skin cancer. Dont forget to also wear sunglasses in order to protect your eyes from too much damaging sunlight. Damaging sunlight can accelerate cataract formation.   If you have multiple sexual partners, or if you are not in a monogamous relationship, practice safe sex. Use condoms. Condoms are used to help reduce the spread of sexually transmitted infections (or STIs).  Consider an HIV test if you have never been tested.  Consider routine screening for STIs if you have multiple sexual partners.   Keep carbon monoxide and smoke detectors in your home functioning at all times. Change the batteries every 6 months or use a model that plugs into the wall or is hard wired in.   END OF LIFE PLANNING/ADVANCED DIRECTIVES Advance health-care planning is deciding the kind of care you want  at the end of life. While alert competent adults are able to exercise their rights to make health care and financial decisions, problems arise when an individual becomes unconscious, incapacitated, or otherwise unable to communicate or make such decisions. Advance health care directives are the legal documents in which you give written instructions about your choices limited, aggressive or palliative care if, in the future, you cannot speak for yourself.  Advanced directives include the following: [place] allows you to appoint someone to act as your health care agent to make health care decisions for you should it be determined by your health care provider that you are no longer able to make these decisions for yourself.  A Living Will is a legal document in which you can declare that under certain conditions you desire your life not be prolonged by extraordinary or artificial means during your last illness or when you are near death. We can provide you with sample advanced directives, you can get an attorney to prepare these for you, or you can visit [place] Secretary of States website for additional information and resources at [email or web address]  Further, I recommend you have an attorney prepare a Will and Durable Power of Attorney if you havent done so already.  Please get [place] a copy of your health care Advanced Directives.   PREVENTATIV E CARE RECOMMENDATIONS:  Vaccinations: We recommend the following vaccinations as part of your preventative care:  Pneumococcal vaccine is recommended to protect against certain types of pneumonia.  This is normally recommended for adults age 14 or older once, or up to every 5 years for those at high risk.  The vaccine is also recommended for adults younger than 61 years old with certain underlying conditions that make them high risk for pneumonia.  Influenza vaccine is recommended to protect against seasonal influenza or  the flu. Influenza is a serious disease that can lead to hospitalization and sometimes even death. Traditional flu vaccines (called trivalent vaccines) are made to protect against three flu viruses; an influenza A (H1N1) virus, an influenza A (H3N2) virus, and an influenza B virus. In addition, there are flu vaccines made to protect against four flu viruses (called quadrivalent vaccines). These vaccines protect against the same viruses as the trivalent vaccine and an additional B virus.  We recommend the high dose influenza  vaccine to those 31 years and older.  Hepatitis B vaccine to protect against a form of infection of the liver by a virus acquired from blood or body fluids, particularly for high risk groups.  Td or Tdap vaccine to protect against Tetanus, diphtheria and pertussis which can be very serious.  These diseases are caused by bacteria.  Diphtheria and pertussis are spread from person to person through coughing or sneezing.  Tetanus enters the body through cuts, scratches, or wounds.  Tetanus (Lockjaw) causes painful muscle tightening and stiffness, usually all over the body.  Diphtheria can cause a thick coating to form in the back of the throat.  It can lead to breathing problems, paralysis, heart failure, and death.  Pertussis (Whooping Cough) causes severe coughing spells, which can cause difficulty breathing, vomiting and disturbed sleep.  Td or Tdap is usually given every 10 years.  Shingles vaccine to protect against Varicella Zoster if you are older than age 31, or younger than 61 years old with certain underlying illness.    Cancer Screening: Most routine colon cancer screening begins at the age of 84.  Subsequent colonoscopies are performed either every 5-10 years for normal screening, or every 2-5 years for higher risks patients, up until age 41 years of age. Annual screening is done with easy to use take-home tests to check for hidden blood in the stool called hemoccult tests.   Sigmoidoscopy or colonoscopy can detect the earliest forms of colon cancer and is life saving. These tests use a small camera at the end of a tube to directly examine the colon.   Prostate cancer screening usually begins at age 88 years old, or younger age for those with higher risk.  Those at higher risk include African-Americans or having a family history of prostate cancer. There are two types of tests for prostate cancer - Prostate-specific antigen (PSA) testing. Recent studies raise questions about prostate cancer using PSA and you should discuss this with your caregiver.  The other type of test is the digital rectal exam (in which your doctors lubricated and gloved finger feels for enlargement of the prostate through the anus).  We routinely stop testing at age 2 years of age.  Osteoporosis Screening: Screening for osteoporosis usually begins at age 36 for women, and can be done as frequent as every 2 years.  However, women or men with higher risk of osteoporosis may be screened earlier than age 48.  Osteoporosis or low bone mass is diminished bone strength from alterations in bone architecture leading to bone fragility and increased fracture risk.     Cardiovascular Screening: Fat and cholesterol leaves deposits in your arteries that can block them. This causes heart disease and vessel disease elsewhere in your body.  If your cholesterol is found to be high, or if you have heart disease or certain other medical conditions, then you may need to have your cholesterol monitored frequently and be treated with medication. Cardiovascular screening in the form of lab tests for cholesterol, HDL and triglycerides can be done every 5 years.  A screening electrocardiogram can be done as part of the Welcome to Medicare physical.  Diabetes Screening: Diabetes screening can be done at least every 3 years for those with risk factors,  or every 6-43month for prediabetic patients.  Screening includes fasting  blood sugar test or glucose tolerance test.  Risk factors include hypertension, dyslipidemia, obesity, previously abnormal glucose tests, family history of diabetes, age 7455years or older, and history of  gestations diabetes.   AAA (abdominal aortic aneurysm) Screening: Medicare allows for a one time ultrasound to screen for abdominal aortic aneurysm if done as a referral as part of the Welcome to Medicare exam.  Men eligible for this screening include those men between age 34-71 years of age who have smoked at least 100 cigarettes in his lifetime and/or has a family history of AAA.  HIV Screening:  Medicare allows for yearly screening for patients at high risk for contracting HIV disease.         Preventive Care 40-64 Years, Male Preventive care refers to lifestyle choices and visits with your health care provider that can promote health and wellness. What does preventive care include?   A yearly physical exam. This is also called an annual well check.  Dental exams once or twice a year.  Routine eye exams. Ask your health care provider how often you should have your eyes checked.  Personal lifestyle choices, including: ? Daily care of your teeth and gums. ? Regular physical activity. ? Eating a healthy diet. ? Avoiding tobacco and drug use. ? Limiting alcohol use. ? Practicing safe sex. ? Taking low-dose aspirin every day starting at age 75. What happens during an annual well check? The services and screenings done by your health care provider during your annual well check will depend on your age, overall health, lifestyle risk factors, and family history of disease. Counseling Your health care provider may ask you questions about your:  Alcohol use.  Tobacco use.  Drug use.  Emotional well-being.  Home and relationship well-being.  Sexual activity.  Eating habits.  Work and work Statistician. Screening You may have the following tests or measurements:  Height,  weight, and BMI.  Blood pressure.  Lipid and cholesterol levels. These may be checked every 5 years, or more frequently if you are over 64 years old.  Skin check.  Lung cancer screening. You may have this screening every year starting at age 66 if you have a 30-pack-year history of smoking and currently smoke or have quit within the past 15 years.  Colorectal cancer screening. All adults should have this screening starting at age 24 and continuing until age 55. Your health care provider may recommend screening at age 46. You will have tests every 1-10 years, depending on your results and the type of screening test. People at increased risk should start screening at an earlier age. Screening tests may include: ? Guaiac-based fecal occult blood testing. ? Fecal immunochemical test (FIT). ? Stool DNA test. ? Virtual colonoscopy. ? Sigmoidoscopy. During this test, a flexible tube with a tiny camera (sigmoidoscope) is used to examine your rectum and lower colon. The sigmoidoscope is inserted through your anus into your rectum and lower colon. ? Colonoscopy. During this test, a long, thin, flexible tube with a tiny camera (colonoscope) is used to examine your entire colon and rectum.  Prostate cancer screening. Recommendations will vary depending on your family history and other risks.  Hepatitis C blood test.  Hepatitis B blood test.  Sexually transmitted disease (STD) testing.  Diabetes screening. This is done by checking your blood sugar (glucose) after you have not eaten for a while (fasting). You may have this done every 1-3 years. Discuss your test results, treatment options, and if necessary, the need for more tests with your health care provider. Vaccines Your health care provider may recommend certain vaccines, such as:  Influenza vaccine. This is recommended every year.  Tetanus, diphtheria, and  acellular pertussis (Tdap, Td) vaccine. You may need a Td booster every 10  years.  Varicella vaccine. You may need this if you have not been vaccinated.  Zoster vaccine. You may need this after age 32.  Measles, mumps, and rubella (MMR) vaccine. You may need at least one dose of MMR if you were born in 1957 or later. You may also need a second dose.  Pneumococcal 13-valent conjugate (PCV13) vaccine. You may need this if you have certain conditions and have not been vaccinated.  Pneumococcal polysaccharide (PPSV23) vaccine. You may need one or two doses if you smoke cigarettes or if you have certain conditions.  Meningococcal vaccine. You may need this if you have certain conditions.  Hepatitis A vaccine. You may need this if you have certain conditions or if you travel or work in places where you may be exposed to hepatitis A.  Hepatitis B vaccine. You may need this if you have certain conditions or if you travel or work in places where you may be exposed to hepatitis B.  Haemophilus influenzae type b (Hib) vaccine. You may need this if you have certain risk factors. Talk to your health care provider about which screenings and vaccines you need and how often you need them. This information is not intended to replace advice given to you by your health care provider. Make sure you discuss any questions you have with your health care provider. Document Released: 01/16/2015 Document Revised: 02/09/2017 Document Reviewed: 10/21/2014 Elsevier Interactive Patient Education  2019 [employer].

## 2018-04-05 NOTE — Progress Notes (Signed)
Steve Burton is a 63 y.o. male who is due for annual wellness visit and follow-up on chronic medical conditions.  He has the following concerns:  No new concerns.   Interactive audio and video telecommunications were attempted between this provider and patient, however due to patient Steve Burton, they did not have access to video capability.  We continued and completed visit with audio only.   The provider was located in the office. The patient did consent to this visit and is aware of possible charges through their insurance for this visit.  The other persons participating in this telemedicine service were none.  This virtual service is not related to other E/M service within previous 7 days.  Recently saw his cardiologist, Dr. Stanford Breed.   States BP is well controlled.  He does check it regularly at home.  No concerns with current medication regimen.  Reports taking his statin without any issues.  Is due for repeat labs.    Immunization History  Administered Date(s) Administered  . Td 07/25/2014   Last colonoscopy: 2016 Last PSA: 03/2017 and it was elevated.  Dentist: Dr. Gloriann Loan on [address] [facility]: years  Exercise: some walking around the house with cane. Does home PT.   Other doctors caring for patient include: Dr. Stanford Breed- Cardiology Dr. Gloriann Loan- Dentist -[facility]   Depression screen:  See questionnaire below.     Depression screen [facility] 2/9 04/05/2018 11/21/2016 07/25/2014 05/16/2013  Decreased Interest 0 0 0 0  Down, Depressed, Hopeless 0 0 0 0  PHQ - 2 Score 0 0 0 0    Fall Screen: See Questionaire below.   Fall Risk  04/05/2018 11/21/2016 05/16/2013  Falls in the past year? 0 Yes No  Number falls in past yr: 0 1 -  Injury with Fall? 0 No -  Risk for fall due to : - - Impaired mobility;Impaired balance/gait    ADL screen:  See questionnaire below.  Functional Status Survey: Is the patient deaf or have difficulty hearing?: No Does the patient have difficulty  seeing, even when wearing glasses/contacts?: No Does the patient have difficulty concentrating, remembering, or making decisions?: No Does the patient have difficulty walking or climbing stairs?: Yes(in wheelchair but can some) Does the patient have difficulty dressing or bathing?: No Does the patient have difficulty doing errands alone such as visiting a doctor's office or shopping?: Yes   End of Life Discussion:  Patient does not have a living will and medical power of attorney.  States he does have the paperwork that I gave him last year but has not filled that out.  States his wife will make all of his decisions.   Review of Systems  Constitutional: -fever, -chills, -sweats, -unexpected weight change, -anorexia, -fatigue Allergy: -sneezing, -itching, -congestion Dermatology: denies changing moles, rash, lumps, new worrisome lesions ENT: -runny nose, -ear pain, -sore throat, -hoarseness, -sinus pain, -teeth pain, -tinnitus, -hearing loss, -epistaxis Cardiology:  -chest pain, -palpitations, -edema, -orthopnea, -paroxysmal nocturnal dyspnea Respiratory: -cough, -shortness of breath, -dyspnea on exertion, -wheezing, -hemoptysis Gastroenterology: -abdominal pain, -nausea, -vomiting, -diarrhea, -constipation, -blood in stool, -changes in bowel movement, -dysphagia Hematology: -bleeding or bruising problems Musculoskeletal: -arthralgias, -myalgias, -joint swelling, -back pain, -neck pain, -cramping, -gait changes Ophthalmology: -vision changes, -eye redness, -itching, -discharge Urology: -dysuria, -difficulty urinating, -hematuria, -urinary frequency, -urgency, incontinence Neurology: -headache, -weakness, -tingling, -numbness, -speech abnormality, -memory loss, -falls, -dizziness Psychology:  -depressed mood, -agitation, -sleep problems   PHYSICAL EXAM:  BP 114/72   Wt 180 lb (81.6 kg)  BMI 24.41 kg/m   General Appearance: Alert, cooperative, no distress, appears stated age Head:  Normocephalic, without obvious abnormality, atraumatic Eyes: PERRL, conjunctiva/corneas clear, EOM's intact, fundi benign Ears: Normal TM's and external ear canals Nose: Nares normal, mucosa normal, no drainage or sinus   tenderness Throat: Lips, mucosa, and tongue normal; teeth and gums normal Neck: Supple, no lymphadenopathy, thyroid:no enlargement/tenderness/nodules; no carotid bruit or JVD Back: Spine nontender, no curvature, ROM normal, no CVA tenderness Lungs: Clear to auscultation bilaterally without wheezes, rales or ronchi; respirations unlabored Chest Wall: No tenderness or deformity Heart: Regular rate and rhythm, S1 and S2 normal, no murmur, rub or gallop Breast Exam: No chest wall tenderness, masses or gynecomastia Abdomen: Soft, non-tender, nondistended, normoactive bowel sounds, no masses, no hepatosplenomegaly Genitalia: Normal male external genitalia without lesions.  Testicles without masses.  No inguinal hernias. Rectal: Normal sphincter tone, no masses or tenderness; guaiac negative stool.  Prostate smooth, no nodules, not enlarged. Extremities: No clubbing, cyanosis or edema Pulses: 2+ and symmetric all extremities Skin: Skin color, texture, turgor normal, no rashes or lesions Lymph nodes: Cervical, supraclavicular, and axillary nodes normal Neurologic: CNII-XII intact, normal strength, sensation and gait; reflexes 2+ and symmetric throughout   Psych: Normal mood, affect, hygiene and grooming  ASSESSMENT/PLAN: Medicare annual wellness visit, subsequent  Essential hypertension  Elevated LDL cholesterol level  History of CVA with residual deficit  Elevated PSA measurement  Left spastic hemiparesis (HCC)  Information obtained using telephone only.  Patient does not have access to video. Reports being in his usual state of health and no new concerns. Discussed that he is due for recheck of labs however since he does have to take public transportation in order to  travel to his healthcare appointments, we will hold off for at least 4 to 6 weeks due to the current Covid-19 pandemic. His blood pressure appears to be well controlled and he is keeping a close eye on this.  Encouraged healthy low-sodium diet.  Continue on current medication regimen.  He did have a recent visit with his cardiologist. He continues doing in exercises at home that he learned in physical therapy for left arm spasticity and is taking the baclofen 3 times daily. Elevated PSA -I will attempt to get office notes and results from his urologist appointment.  Patient is under the impression that he does not need any additional work-up or follow-up with his urologist. Immunizations up-to-date. Colonoscopy is up-to-date. Encouraged him to fill out the advance directives. Encouraged him to stay as active as possible and to stay at home during the virus situation.  He may schedule to come in for labs once things are improving and then I will see him in my office in 6 months.   Discussed PSA screening (risks/benefits), recommended at least 30 minutes of aerobic activity at least 5 days/week; proper sunscreen use reviewed; healthy diet and alcohol recommendations (less than or equal to 2 drinks/day) reviewed; regular seatbelt use; changing batteries in smoke detectors. Immunization recommendations discussed.  Colonoscopy recommendations reviewed.   Medicare Attestation I have personally reviewed: The patient's medical and social history Their use of alcohol, tobacco or illicit drugs Their current medications and supplements The patient's functional ability including ADLs,fall risks, home safety risks, cognitive, and hearing and visual impairment Diet and physical activities Evidence for depression or mood disorders  The patient's weight, height, and BMI have been recorded in the chart.  I have made referrals, counseling, and provided education to the patient based on review  of the above and I  have provided the patient with a written personalized care plan for preventive services.     Harland Dingwall, NP-C   04/05/2018

## 2018-04-17 ENCOUNTER — Encounter: Payer: Self-pay | Admitting: Family Medicine

## 2018-04-19 DIAGNOSIS — L989 Disorder of the skin and subcutaneous tissue, unspecified: Secondary | ICD-10-CM | POA: Diagnosis not present

## 2018-04-19 DIAGNOSIS — D0362 Melanoma in situ of left upper limb, including shoulder: Secondary | ICD-10-CM | POA: Diagnosis not present

## 2018-05-03 ENCOUNTER — Encounter: Payer: Self-pay | Admitting: Family Medicine

## 2018-05-10 ENCOUNTER — Telehealth: Payer: Self-pay | Admitting: Family Medicine

## 2018-05-10 NOTE — Telephone Encounter (Signed)
Received a form from pt via mail. Sending back to be completed.

## 2018-05-18 ENCOUNTER — Other Ambulatory Visit: Payer: Self-pay | Admitting: Cardiology

## 2018-05-18 DIAGNOSIS — I10 Essential (primary) hypertension: Secondary | ICD-10-CM

## 2018-05-18 NOTE — Telephone Encounter (Signed)
Diltiazem CD 240 mg refilled. 

## 2018-06-07 DIAGNOSIS — D235 Other benign neoplasm of skin of trunk: Secondary | ICD-10-CM | POA: Diagnosis not present

## 2018-06-07 DIAGNOSIS — D225 Melanocytic nevi of trunk: Secondary | ICD-10-CM | POA: Diagnosis not present

## 2018-06-07 DIAGNOSIS — D2261 Melanocytic nevi of right upper limb, including shoulder: Secondary | ICD-10-CM | POA: Diagnosis not present

## 2018-06-07 DIAGNOSIS — L409 Psoriasis, unspecified: Secondary | ICD-10-CM | POA: Diagnosis not present

## 2018-06-07 DIAGNOSIS — D2361 Other benign neoplasm of skin of right upper limb, including shoulder: Secondary | ICD-10-CM | POA: Diagnosis not present

## 2018-06-07 DIAGNOSIS — B359 Dermatophytosis, unspecified: Secondary | ICD-10-CM | POA: Diagnosis not present

## 2018-06-09 ENCOUNTER — Encounter: Payer: Self-pay | Admitting: Family Medicine

## 2018-06-20 ENCOUNTER — Other Ambulatory Visit: Payer: Self-pay | Admitting: Cardiology

## 2018-06-20 DIAGNOSIS — I10 Essential (primary) hypertension: Secondary | ICD-10-CM

## 2018-07-28 ENCOUNTER — Other Ambulatory Visit: Payer: Self-pay | Admitting: Family Medicine

## 2018-07-30 NOTE — Telephone Encounter (Signed)
Is this ok to refill? Vickie's patient

## 2018-08-05 ENCOUNTER — Other Ambulatory Visit: Payer: Self-pay | Admitting: Family Medicine

## 2018-08-05 DIAGNOSIS — I10 Essential (primary) hypertension: Secondary | ICD-10-CM

## 2018-08-09 DIAGNOSIS — D235 Other benign neoplasm of skin of trunk: Secondary | ICD-10-CM | POA: Diagnosis not present

## 2018-08-09 DIAGNOSIS — D2262 Melanocytic nevi of left upper limb, including shoulder: Secondary | ICD-10-CM | POA: Diagnosis not present

## 2018-08-09 DIAGNOSIS — D225 Melanocytic nevi of trunk: Secondary | ICD-10-CM | POA: Diagnosis not present

## 2018-08-09 DIAGNOSIS — D2362 Other benign neoplasm of skin of left upper limb, including shoulder: Secondary | ICD-10-CM | POA: Diagnosis not present

## 2018-08-09 DIAGNOSIS — L988 Other specified disorders of the skin and subcutaneous tissue: Secondary | ICD-10-CM | POA: Diagnosis not present

## 2018-08-09 DIAGNOSIS — D2361 Other benign neoplasm of skin of right upper limb, including shoulder: Secondary | ICD-10-CM | POA: Diagnosis not present

## 2018-08-09 DIAGNOSIS — C4359 Malignant melanoma of other part of trunk: Secondary | ICD-10-CM | POA: Diagnosis not present

## 2018-08-30 DIAGNOSIS — C4359 Malignant melanoma of other part of trunk: Secondary | ICD-10-CM | POA: Diagnosis not present

## 2018-09-04 DIAGNOSIS — L989 Disorder of the skin and subcutaneous tissue, unspecified: Secondary | ICD-10-CM | POA: Diagnosis not present

## 2018-09-04 DIAGNOSIS — C4359 Malignant melanoma of other part of trunk: Secondary | ICD-10-CM | POA: Diagnosis not present

## 2018-09-11 ENCOUNTER — Other Ambulatory Visit: Payer: Self-pay | Admitting: Family Medicine

## 2018-09-11 DIAGNOSIS — I10 Essential (primary) hypertension: Secondary | ICD-10-CM

## 2018-09-12 NOTE — Telephone Encounter (Signed)
[facility] is requesting to fill pt baclofen. Please advise

## 2018-09-13 ENCOUNTER — Other Ambulatory Visit: Payer: Self-pay

## 2018-09-13 ENCOUNTER — Other Ambulatory Visit: Payer: Medicare Other

## 2018-09-13 DIAGNOSIS — E7800 Pure hypercholesterolemia, unspecified: Secondary | ICD-10-CM

## 2018-09-14 ENCOUNTER — Encounter: Payer: Self-pay | Admitting: Family Medicine

## 2018-09-14 LAB — COMPREHENSIVE METABOLIC PANEL
ALT: 17 IU/L (ref 0–44)
AST: 12 IU/L (ref 0–40)
Albumin/Globulin Ratio: 2 (ref 1.2–2.2)
Albumin: 4.3 g/dL (ref 3.8–4.8)
Alkaline Phosphatase: 98 IU/L (ref 39–117)
BUN/Creatinine Ratio: 12 (ref 10–24)
BUN: 12 mg/dL (ref 8–27)
Bilirubin Total: 0.5 mg/dL (ref 0.0–1.2)
CO2: 26 mmol/L (ref 20–29)
Calcium: 10 mg/dL (ref 8.6–10.2)
Chloride: 103 mmol/L (ref 96–106)
Creatinine, Ser: 0.98 mg/dL (ref 0.76–1.27)
GFR calc Af Amer: 96 mL/min/{1.73_m2} (ref >59)
GFR calc non Af Amer: 83 mL/min/{1.73_m2} (ref >59)
Globulin, Total: 2.2 g/dL (ref 1.5–4.5)
Glucose: 105 mg/dL — ABNORMAL HIGH (ref 65–99)
Potassium: 4.3 mmol/L (ref 3.5–5.2)
Sodium: 141 mmol/L (ref 134–144)
Total Protein: 6.5 g/dL (ref 6.0–8.5)

## 2018-09-14 LAB — LIPID PANEL
Chol/HDL Ratio: 3.2 ratio (ref 0.0–5.0)
Cholesterol, Total: 157 mg/dL (ref 100–199)
HDL: 49 mg/dL (ref >39)
LDL Chol Calc (NIH): 89 mg/dL (ref 0–99)
Triglycerides: 102 mg/dL (ref 0–149)
VLDL Cholesterol Cal: 19 mg/dL (ref 5–40)

## 2018-09-14 MED ORDER — ATORVASTATIN CALCIUM 10 MG PO TABS: 10.0000 mg | ORAL_TABLET | Freq: Every day | ORAL | 1 refills | Status: DC

## 2018-10-05 ENCOUNTER — Other Ambulatory Visit: Payer: Self-pay

## 2018-10-05 ENCOUNTER — Ambulatory Visit (INDEPENDENT_AMBULATORY_CARE_PROVIDER_SITE_OTHER): Payer: Medicare Other | Admitting: Family Medicine

## 2018-10-05 VITALS — BP 138/80 | HR 63 | Temp 97.7°F

## 2018-10-05 DIAGNOSIS — G8114 Spastic hemiplegia affecting left nondominant side: Secondary | ICD-10-CM | POA: Diagnosis not present

## 2018-10-05 DIAGNOSIS — I6930 Unspecified sequelae of cerebral infarction: Secondary | ICD-10-CM

## 2018-10-05 DIAGNOSIS — R972 Elevated prostate specific antigen [PSA]: Secondary | ICD-10-CM

## 2018-10-05 DIAGNOSIS — I10 Essential (primary) hypertension: Secondary | ICD-10-CM

## 2018-10-05 DIAGNOSIS — E7800 Pure hypercholesterolemia, unspecified: Secondary | ICD-10-CM | POA: Diagnosis not present

## 2018-10-05 NOTE — Progress Notes (Signed)
   Subjective:    Patient ID: Steve Burton, male    DOB: [birth year], 63 y.o.   MRN: [id number]  HPI Chief Complaint  Patient presents with  . 6 month med check    6 month med check. not checking bp. declines flu shot   Here for a 6 month medication management visit.   HTN- taking losartan 100 mg, diltiazem 240 mg 24 hr capsule, labetalol and spironolactone   HL- reports taking atorvastatin daily without any issues.   He saw Dr. Stanford Breed, cardiology in February 2020. Needs annual f/u per EMR.   PSA 6.5 in the past. He did see a urologist in 04/2017.  A biopsy was recommended however the patient does not recall this and did not follow up.   Denies fever, chills, dizziness, chest pain, palpitations, shortness of breath, abdominal pain, N/V/D, urinary symptoms, LE edema.    Review of Systems Pertinent positives and negatives in the history of present illness.     Objective:   Physical Exam BP 138/80   Pulse 63   Temp 97.7 F (36.5 C)   Alert and in no distress. Cardiac exam shows a regular rhythm. Lungs are clear to auscultation. Left sided weakness is present and unchanged. Extremities without edema.       Assessment & Plan:  Essential hypertension- close to goal range. continue on current medication regimen. Follow up with cardiology in February 2021 as recommended. He will be due for Medicare AWV in April 2021.   Left spastic hemiparesis ([place])- continue on baclofen. Continue home PT exercises.   Elevated LDL cholesterol level- continue statin therapy.   Elevated PSA measurement- call and schedule with Dr. Gloriann Loan at [facility]. He is overdue.   History of hemorrhagic cerebrovascular accident (CVA) with residual deficit- discussed importance of good BP control.

## 2018-10-05 NOTE — Patient Instructions (Signed)
Call Dr. Gloriann Loan at [facility] to scheduled a visit regarding PSA.   Call Dr. Jacalyn Lefevre office in December to schedule for February 2021.

## 2018-10-31 ENCOUNTER — Other Ambulatory Visit: Payer: Self-pay | Admitting: Family Medicine

## 2018-10-31 DIAGNOSIS — I10 Essential (primary) hypertension: Secondary | ICD-10-CM

## 2019-01-14 DIAGNOSIS — I10 Essential (primary) hypertension: Secondary | ICD-10-CM

## 2019-01-14 MED ORDER — DILTIAZEM HCL ER COATED BEADS 240 MG PO CP24: 240.0000 mg | ORAL_CAPSULE | Freq: Every day | ORAL | 0 refills | Status: DC

## 2019-01-24 ENCOUNTER — Other Ambulatory Visit: Payer: Self-pay | Admitting: Family Medicine

## 2019-02-02 ENCOUNTER — Other Ambulatory Visit: Payer: Self-pay | Admitting: Cardiology

## 2019-03-01 NOTE — Progress Notes (Signed)
HPI: FUhypertension and possible HOCM.Seen previously by Dr Domenic Polite for hypertension. Echo 4/11 showed hyperdynamic LV function, moderate LVH, intracavitary gradient of 4 m/s, grade 1 diastolic dysfunction, SAM with peak LVOT gradient of 60 mmHg. Carotid dopplers 4/11 showed no stenosis bilaterally. Cardiac MRI December 2016 showed normal LV function, focal basal septal hypertrophy, no SAMand hypertrophic cardiomyopathy could not be excluded but findings also could be consistent with hypertension.Last echo 2/18 showed vigorous LV systolic function; grade 1DD, mild LVH and mild LAE.Since last seen,patient denies dyspnea, chest pain, palpitations or syncope.  Current Outpatient Medications  Medication Sig Dispense Refill  . atorvastatin (LIPITOR) 10 MG tablet TAKE 1 TABLET BY MOUTH  DAILY 90 tablet 1  . baclofen (LIORESAL) 10 MG tablet TAKE 1 TABLET BY MOUTH 3  TIMES DAILY 270 tablet 0  . diltiazem (CARDIZEM CD) 240 MG 24 hr capsule Take 1 capsule (240 mg total) by mouth daily. 90 capsule 0  . labetalol (NORMODYNE) 100 MG tablet TAKE 1 TABLET BY MOUTH TWO  TIMES DAILY 180 tablet 3  . labetalol (NORMODYNE) 200 MG tablet TAKE 1 TABLET BY MOUTH TWO  TIMES DAILY 180 tablet 3  . losartan (COZAAR) 100 MG tablet TAKE 1 TABLET BY MOUTH  DAILY 90 tablet 1  . Multiple Vitamin (MULTIVITAMIN) tablet Take 1 tablet by mouth daily.    . potassium chloride SA (K-DUR,KLOR-CON) 20 MEQ tablet TAKE 2 TABLETS BY MOUTH  DAILY 180 tablet 3  . spironolactone (ALDACTONE) 25 MG tablet Take 1 tablet (25 mg total) by mouth daily. Please keep upcoming appt for refills. Thank you 90 tablet 0   No current facility-administered medications for this visit.     Past Medical History:  Diagnosis Date  . Elevated LDL cholesterol level   . Elevated PSA measurement 11/22/2016  . Hypertension   . Impaired fasting glucose 11/22/2016  . Stroke [facility]) 2011    Past Surgical History:  Procedure Laterality Date  .  CATARACT EXTRACTION  2006   right eye  . HERNIA REPAIR  2005    Social History   Socioeconomic History  . Marital status: Married    Spouse name: Not on file  . Number of children: Not on file  . Years of education: Not on file  . Highest education level: Not on file  Occupational History  . Not on file  Tobacco Use  . Smoking status: Never Smoker  . Smokeless tobacco: Never Used  Substance and Sexual Activity  . Alcohol use: Yes    Alcohol/week: 0.0 standard drinks    Comment: 1 glass of wine per week.   . Drug use: No  . Sexual activity: Not on file  Other Topics Concern  . Not on file  Social History Narrative  . Not on file   Social Determinants of Health   Financial Resource Strain:   . Difficulty of Paying Living Expenses: Not on file  Food Insecurity:   . Worried About Charity fundraiser in the Last Year: Not on file  . Ran Out of Food in the Last Year: Not on file  Transportation Needs:   . Lack of Transportation (Medical): Not on file  . Lack of Transportation (Non-Medical): Not on file  Physical Activity:   . Days of Exercise per Week: Not on file  . Minutes of Exercise per Session: Not on file  Stress:   . Feeling of Stress : Not on file  Social Connections:   . Frequency  of Communication with Friends and Family: Not on file  . Frequency of Social Gatherings with Friends and Family: Not on file  . Attends Religious Services: Not on file  . Active Member of Clubs or Organizations: Not on file  . Attends Archivist Meetings: Not on file  . Marital Status: Not on file  Intimate Partner Violence:   . Fear of Current or Ex-Partner: Not on file  . Emotionally Abused: Not on file  . Physically Abused: Not on file  . Sexually Abused: Not on file    Family History  Problem Relation Age of Onset  . Stroke Mother   . Heart disease Father        MI at age 50  . Epilepsy Sister   . Colon cancer Neg Hx     ROS: no fevers or chills, productive  cough, hemoptysis, dysphasia, odynophagia, melena, hematochezia, dysuria, hematuria, rash, seizure activity, orthopnea, PND, pedal edema, claudication. Remaining systems are negative.  Physical Exam: Well-developed well-nourished in no acute distress.  Skin is warm and dry.  HEENT is normal.  Neck is supple.  Chest is clear to auscultation with normal expansion.  Cardiovascular exam is regular rate and rhythm.  Abdominal exam nontender or distended. No masses palpated. Extremities show no edema. neuro residual left-sided hemiparesis from prior CVA  Electrocardiogram shows sinus bradycardia, inferior T wave inversion.  A/P  1 possible hypertrophic cardiomyopathy-previous echocardiogram suggested HOCM but follow-up echocardiogram and cardiac MRI suggested LVH is more likely related to longstanding hypertension.  We will repeat echocardiogram.  2 hypertension-blood pressure controlled.  Continue present medical regimen.  Check potassium and renal function.  3 hyperlipidemia-continue statin.  Check lipids and liver.  Kirk Ruths, MD

## 2019-03-11 ENCOUNTER — Encounter: Payer: Self-pay | Admitting: Cardiology

## 2019-03-11 ENCOUNTER — Other Ambulatory Visit: Payer: Self-pay

## 2019-03-11 ENCOUNTER — Ambulatory Visit: Payer: Medicare Other | Admitting: Cardiology

## 2019-03-11 VITALS — BP 128/68 | HR 54 | Temp 98.1°F | Ht 73.0 in | Wt 178.8 lb

## 2019-03-11 DIAGNOSIS — I10 Essential (primary) hypertension: Secondary | ICD-10-CM | POA: Diagnosis not present

## 2019-03-11 DIAGNOSIS — I421 Obstructive hypertrophic cardiomyopathy: Secondary | ICD-10-CM

## 2019-03-11 DIAGNOSIS — E7800 Pure hypercholesterolemia, unspecified: Secondary | ICD-10-CM | POA: Diagnosis not present

## 2019-03-11 NOTE — Patient Instructions (Signed)
Medication Instructions:  NO CHANGE *If you need a refill on your cardiac medications before your next appointment, please call your pharmacy*   Lab Work: Your physician recommends that you HAVE LAB WORK TODAY If you have labs (blood work) drawn today and your tests are completely normal, you will receive your results only by: Marland Kitchen MyChart Message (if you have MyChart) OR . A paper copy in the mail If you have any lab test that is abnormal or we need to change your treatment, we will call you to review the results.   Testing/Procedures: Your physician has requested that you have an echocardiogram. Echocardiography is a painless test that uses sound waves to create images of your heart. It provides your doctor with information about the size and shape of your heart and how well your heart's chambers and valves are working. This procedure takes approximately one hour. There are no restrictions for this procedure.[place]     Follow-Up: At [facility], you and your health needs are our priority.  As part of our continuing mission to provide you with exceptional heart care, we have created designated Provider Care Teams.  These Care Teams include your primary Cardiologist (physician) and Advanced Practice Providers (APPs -  Physician Assistants and Nurse Practitioners) who all work together to provide you with the care you need, when you need it.  We recommend signing up for the patient portal called "MyChart".  Sign up information is provided on this After Visit Summary.  MyChart is used to connect with patients for Virtual Visits (Telemedicine).  Patients are able to view lab/test results, encounter notes, upcoming appointments, etc.  Non-urgent messages can be sent to your provider as well.   To learn more about what you can do with MyChart, go to [email or web address].    Your next appointment:   12 month(s)  The format for your next appointment:   Either In Person or  Virtual  Provider:   You may see Kirk Ruths MD or one of the following Advanced Practice Providers on your designated Care Team:    Kerin Ransom, PA-C  [place], [place]  Coletta Memos, [place]

## 2019-03-28 ENCOUNTER — Other Ambulatory Visit: Payer: Self-pay

## 2019-03-28 ENCOUNTER — Ambulatory Visit (HOSPITAL_COMMUNITY): Payer: Medicare Other | Attending: Cardiology

## 2019-03-28 ENCOUNTER — Other Ambulatory Visit: Payer: Medicare Other | Admitting: *Deleted

## 2019-03-28 DIAGNOSIS — E7800 Pure hypercholesterolemia, unspecified: Secondary | ICD-10-CM | POA: Diagnosis not present

## 2019-03-28 DIAGNOSIS — I421 Obstructive hypertrophic cardiomyopathy: Secondary | ICD-10-CM | POA: Diagnosis not present

## 2019-03-28 LAB — LIPID PANEL
Chol/HDL Ratio: 2.4 ratio (ref 0.0–5.0)
Cholesterol, Total: 142 mg/dL (ref 100–199)
HDL: 58 mg/dL (ref >39)
LDL Chol Calc (NIH): 73 mg/dL (ref 0–99)
Triglycerides: 48 mg/dL (ref 0–149)
VLDL Cholesterol Cal: 11 mg/dL (ref 5–40)

## 2019-03-28 LAB — COMPREHENSIVE METABOLIC PANEL
ALT: 17 IU/L (ref 0–44)
AST: 16 IU/L (ref 0–40)
Albumin/Globulin Ratio: 2.4 — ABNORMAL HIGH (ref 1.2–2.2)
Albumin: 4.5 g/dL (ref 3.8–4.8)
Alkaline Phosphatase: 100 IU/L (ref 39–117)
BUN/Creatinine Ratio: 15 (ref 10–24)
BUN: 14 mg/dL (ref 8–27)
Bilirubin Total: 0.3 mg/dL (ref 0.0–1.2)
CO2: 25 mmol/L (ref 20–29)
Calcium: 9.6 mg/dL (ref 8.6–10.2)
Chloride: 105 mmol/L (ref 96–106)
Creatinine, Ser: 0.94 mg/dL (ref 0.76–1.27)
GFR calc Af Amer: 101 mL/min/{1.73_m2} (ref >59)
GFR calc non Af Amer: 87 mL/min/{1.73_m2} (ref >59)
Globulin, Total: 1.9 g/dL (ref 1.5–4.5)
Glucose: 114 mg/dL — ABNORMAL HIGH (ref 65–99)
Potassium: 3.8 mmol/L (ref 3.5–5.2)
Sodium: 144 mmol/L (ref 134–144)
Total Protein: 6.4 g/dL (ref 6.0–8.5)

## 2019-03-29 ENCOUNTER — Other Ambulatory Visit: Payer: Self-pay

## 2019-03-29 DIAGNOSIS — E876 Hypokalemia: Secondary | ICD-10-CM

## 2019-03-29 MED ORDER — POTASSIUM CHLORIDE CRYS ER 20 MEQ PO TBCR: 40.0000 meq | EXTENDED_RELEASE_TABLET | Freq: Every day | ORAL | 3 refills | Status: DC

## 2019-04-08 ENCOUNTER — Other Ambulatory Visit: Payer: Self-pay | Admitting: Cardiology

## 2019-04-08 MED ORDER — POTASSIUM CHLORIDE CRYS ER 20 MEQ PO TBCR: 40.0000 meq | EXTENDED_RELEASE_TABLET | Freq: Every day | ORAL | 3 refills | Status: DC

## 2019-04-11 ENCOUNTER — Other Ambulatory Visit: Payer: Self-pay | Admitting: Cardiology

## 2019-04-11 DIAGNOSIS — I10 Essential (primary) hypertension: Secondary | ICD-10-CM

## 2019-04-19 ENCOUNTER — Emergency Department (HOSPITAL_COMMUNITY)
Admission: EM | Admit: 2019-04-19 | Discharge: 2019-04-20 | Disposition: A | Discharge status: Home / Self Care | Payer: Medicare Other | Attending: Emergency Medicine | Admitting: Emergency Medicine

## 2019-04-19 ENCOUNTER — Other Ambulatory Visit: Payer: Self-pay

## 2019-04-19 ENCOUNTER — Encounter (HOSPITAL_COMMUNITY): Payer: Self-pay

## 2019-04-19 DIAGNOSIS — I10 Essential (primary) hypertension: Secondary | ICD-10-CM | POA: Insufficient documentation

## 2019-04-19 DIAGNOSIS — R569 Unspecified convulsions: Secondary | ICD-10-CM | POA: Diagnosis not present

## 2019-04-19 DIAGNOSIS — Z79899 Other long term (current) drug therapy: Secondary | ICD-10-CM | POA: Diagnosis not present

## 2019-04-19 DIAGNOSIS — I69334 Monoplegia of upper limb following cerebral infarction affecting left non-dominant side: Secondary | ICD-10-CM | POA: Insufficient documentation

## 2019-04-19 DIAGNOSIS — R404 Transient alteration of awareness: Secondary | ICD-10-CM | POA: Diagnosis not present

## 2019-04-19 DIAGNOSIS — R55 Syncope and collapse: Secondary | ICD-10-CM | POA: Diagnosis not present

## 2019-04-19 DIAGNOSIS — I639 Cerebral infarction, unspecified: Secondary | ICD-10-CM | POA: Diagnosis not present

## 2019-04-19 DIAGNOSIS — R52 Pain, unspecified: Secondary | ICD-10-CM | POA: Diagnosis not present

## 2019-04-19 DIAGNOSIS — Z743 Need for continuous supervision: Secondary | ICD-10-CM | POA: Diagnosis not present

## 2019-04-19 LAB — CBC WITH DIFFERENTIAL/PLATELET
Abs Immature Granulocytes: 0.02 10*3/uL (ref 0.00–0.07)
Basophils Absolute: 0.1 10*3/uL (ref 0.0–0.1)
Basophils Relative: 1 %
Eosinophils Absolute: 0.4 10*3/uL (ref 0.0–0.5)
Eosinophils Relative: 5 %
HCT: 41 % (ref 39.0–52.0)
Hemoglobin: 13.3 g/dL (ref 13.0–17.0)
Immature Granulocytes: 0 %
Lymphocytes Relative: 26 %
Lymphs Abs: 1.9 10*3/uL (ref 0.7–4.0)
MCH: 29 pg (ref 26.0–34.0)
MCHC: 32.4 g/dL (ref 30.0–36.0)
MCV: 89.3 fL (ref 80.0–100.0)
Monocytes Absolute: 0.7 10*3/uL (ref 0.1–1.0)
Monocytes Relative: 9 %
Neutro Abs: 4.4 10*3/uL (ref 1.7–7.7)
Neutrophils Relative %: 59 %
Platelets: 191 10*3/uL (ref 150–400)
RBC: 4.59 MIL/uL (ref 4.22–5.81)
RDW: 12.8 % (ref 11.5–15.5)
WBC: 7.4 10*3/uL (ref 4.0–10.5)
nRBC: 0 % (ref 0.0–0.2)

## 2019-04-19 LAB — CK: Total CK: 120 U/L (ref 49–397)

## 2019-04-19 LAB — TROPONIN I (HIGH SENSITIVITY): Troponin I (High Sensitivity): 3 ng/L (ref <18)

## 2019-04-19 LAB — BASIC METABOLIC PANEL
Anion gap: 18 — ABNORMAL HIGH (ref 5–15)
BUN: 16 mg/dL (ref 8–23)
CO2: 18 mmol/L — ABNORMAL LOW (ref 22–32)
Calcium: 9.4 mg/dL (ref 8.9–10.3)
Chloride: 104 mmol/L (ref 98–111)
Creatinine, Ser: 1.3 mg/dL — ABNORMAL HIGH (ref 0.61–1.24)
GFR calc Af Amer: >60 mL/min (ref >60)
GFR calc non Af Amer: 58 mL/min — ABNORMAL LOW (ref >60)
Glucose, Bld: 130 mg/dL — ABNORMAL HIGH (ref 70–99)
Potassium: 3.9 mmol/L (ref 3.5–5.1)
Sodium: 140 mmol/L (ref 135–145)

## 2019-04-19 LAB — CBG MONITORING, ED: Glucose-Capillary: 120 mg/dL — ABNORMAL HIGH (ref 70–99)

## 2019-04-19 MED ORDER — SODIUM CHLORIDE 0.9 % IV BOLUS
500.0000 mL | Freq: Once | INTRAVENOUS | Status: AC
  Administered 2019-04-19: 500 mL via INTRAVENOUS

## 2019-04-19 NOTE — ED Triage Notes (Signed)
Pt BIB [facility] from home. Pt was sitting in his wheelchair when the wife states he fell over and was unresponsive. Pt has a hx of seizures and also a hx of a stroke with left sided deficits. Pt had an incontinent episode. When EMS arrived pt was breathing but not responding to voice or pain. After several minutes pt began responding to voice.

## 2019-04-19 NOTE — ED Provider Notes (Signed)
[facility] EMERGENCY DEPARTMENT Provider Note   CSN: [id number] Arrival date & time: 04/19/19  2055     History Chief Complaint  Patient presents with  . Seizures    Kaili Zindel is a 64 y.o. male history of cerebral infarct 2011, with residual left-sided hemiparesis, history hypertension, undergoing evaluation for possible hypertrophic cardiomyopathy, presenting to the emergency department with episode of unresponsiveness.  The patient's wife reports that she was at home with the patient tonight.  He was practicing on the piano.  She heard a thump and went in and found the patient lying on the ground poorly responsive.  She noted that his upper extremities were rhythmically twitching.  She said the patient appeared to be breathing but was not responding to her.  She called EMS.  She said when they arrived on scene he had not returned to baseline and still appeared extremely groggy.  He urinated on himself at home.  Initially on arrival in the ER, the patient is unable to provide much additional history was dazed and groggy.  Later his mental status improved and he told me that he been feeling well earlier today.  He denies any fevers chills or infectious symptoms earlier this week.  He denies any cough or congestion.  He denies any dysuria.  He states he had one single seizure in 2011 in the setting of his acute stroke at that time.  He has not been on antiepileptics.  He is not had any further seizures.  He denies headache or any new numbness or weakness.  He denies myalgia anywhere.  He is undergoing evaluation with cardiology for possible HOCM (Dr Stanford Breed).  He denies that he was exerting himself the time of this activity.  He denies any chest pain or SOB in the ED.  HPI     Past Medical History:  Diagnosis Date  . Elevated LDL cholesterol level   . Elevated PSA measurement 11/22/2016  . Hypertension   . Impaired fasting glucose 11/22/2016  . Stroke [facility])  2011    Patient Active Problem List   Diagnosis Date Noted  . Impaired fasting glucose 11/22/2016  . Elevated PSA measurement 11/22/2016  . History of hemorrhagic cerebrovascular accident (CVA) with residual deficit 11/21/2016  . Immunization refused 11/21/2016  . Elevated LDL cholesterol level 03/08/2016  . Hypertrophic obstructive cardiomyopathy ([place]) 12/03/2014  . Abnormal electrocardiogram 09/02/2014  . Left spastic hemiparesis ([place]) 03/07/2011  . Cerebral infarction ([place]) 02/14/2011  . Essential hypertension 08/02/2008    Past Surgical History:  Procedure Laterality Date  . CATARACT EXTRACTION  2006   right eye  . HERNIA REPAIR  2005       Family History  Problem Relation Age of Onset  . Stroke Mother   . Heart disease Father        MI at age 29  . Epilepsy Sister   . Colon cancer Neg Hx     Social History   Tobacco Use  . Smoking status: Never Smoker  . Smokeless tobacco: Never Used  Substance Use Topics  . Alcohol use: Yes    Alcohol/week: 0.0 standard drinks    Comment: 1 glass of wine per week.   . Drug use: No    Home Medications Prior to Admission medications   Medication Sig Start Date End Date Taking? Authorizing Provider  atorvastatin (LIPITOR) 10 MG tablet TAKE 1 TABLET BY MOUTH  DAILY Patient taking differently: Take 10 mg by mouth every evening.  01/24/19  Yes Henson, Vickie L, NP-C  baclofen (LIORESAL) 10 MG tablet TAKE 1 TABLET BY MOUTH 3  TIMES DAILY Patient taking differently: Take 10 mg by mouth 3 (three) times daily.  09/12/18  Yes Henson, Vickie L, NP-C  diltiazem (TIAZAC) 240 MG 24 hr capsule TAKE ONE CAPSULE BY MOUTH DAILY Patient taking differently: Take 240 mg by mouth daily.  04/12/19  Yes Lelon Perla, MD  labetalol (NORMODYNE) 100 MG tablet TAKE 1 TABLET BY MOUTH TWO  TIMES DAILY Patient taking differently: Take 100 mg by mouth 2 (two) times daily.  06/20/18  Yes Lelon Perla, MD  labetalol (NORMODYNE) 200 MG tablet TAKE 1  TABLET BY MOUTH TWO  TIMES DAILY Patient taking differently: Take 200 mg by mouth 2 (two) times daily.  06/20/18  Yes Lelon Perla, MD  losartan (COZAAR) 100 MG tablet TAKE 1 TABLET BY MOUTH  DAILY Patient taking differently: Take 100 mg by mouth daily.  10/31/18  Yes Henson, Vickie L, NP-C  Multiple Vitamins-Minerals (CENTRUM SILVER 50+MEN) TABS Take 1 tablet by mouth daily with lunch.   Yes [provider]  potassium chloride SA (KLOR-CON M20) 20 MEQ tablet Take 2 tablets (40 mEq total) by mouth daily. Patient taking differently: Take 40 mEq by mouth daily with lunch.  04/08/19  Yes Lelon Perla, MD  spironolactone (ALDACTONE) 25 MG tablet Take 1 tablet (25 mg total) by mouth daily. Please keep upcoming appt for refills. Thank you Patient taking differently: Take 25 mg by mouth daily.  02/04/19  Yes Lelon Perla, MD    Allergies    Phenytoin sodium extended and Penicillins  Review of Systems   Review of Systems  Constitutional: Negative for chills and fever.  HENT: Negative for ear pain and sore throat.   Eyes: Negative for pain and visual disturbance.  Respiratory: Negative for cough and shortness of breath.   Cardiovascular: Negative for chest pain and palpitations.  Gastrointestinal: Negative for abdominal pain and vomiting.  Genitourinary: Negative for dysuria and hematuria.  Musculoskeletal: Negative for arthralgias and back pain.  Skin: Negative for color change and rash.  Neurological: Positive for syncope. Negative for light-headedness and headaches.  All other systems reviewed and are negative.   Physical Exam Updated Vital Signs BP (!) 153/86   Pulse 76   Temp 98 F (36.7 C) (Oral)   Resp 18   SpO2 97%   Physical Exam Vitals and nursing note reviewed.  Constitutional:      Appearance: He is well-developed.  HENT:     Head: Normocephalic and atraumatic.  Eyes:     Conjunctiva/sclera: Conjunctivae normal.     Pupils: Pupils are equal, round,  and reactive to light.  Cardiovascular:     Rate and Rhythm: Normal rate and regular rhythm.     Pulses: Normal pulses.  Pulmonary:     Effort: Pulmonary effort is normal. No respiratory distress.  Abdominal:     General: There is no distension.     Palpations: Abdomen is soft.  Musculoskeletal:     Cervical back: Neck supple.  Skin:    General: Skin is warm and dry.  Neurological:     Mental Status: He is alert.     GCS: GCS eye subscore is 4. GCS verbal subscore is 5. GCS motor subscore is 6.     Cranial Nerves: Cranial nerves are intact.     Sensory: Sensation is intact.     Comments: Left upper extremity hemiparesis (unchanged  from prior stroke according to patient and wife), remainder of extremities with 5/5 strength  Psychiatric:        Mood and Affect: Mood normal.        Behavior: Behavior normal.     ED Results / Procedures / Treatments   Labs (all labs ordered are listed, but only abnormal results are displayed) Labs Reviewed  BASIC METABOLIC PANEL - Abnormal; Notable for the following components:      Result Value   CO2 18 (*)    Glucose, Bld 130 (*)    Creatinine, Ser 1.30 (*)    GFR calc non Af Amer 58 (*)    Anion gap 18 (*)    All other components within normal limits  URINALYSIS, ROUTINE W REFLEX MICROSCOPIC - Abnormal; Notable for the following components:   Color, Urine STRAW (*)    Ketones, ur 5 (*)    Protein, ur 30 (*)    All other components within normal limits  CBG MONITORING, ED - Abnormal; Notable for the following components:   Glucose-Capillary 120 (*)    All other components within normal limits  CBC WITH DIFFERENTIAL/PLATELET  RAPID URINE DRUG SCREEN, HOSP PERFORMED  CK  MAGNESIUM  TROPONIN I (HIGH SENSITIVITY)  TROPONIN I (HIGH SENSITIVITY)    EKG None  Radiology DG Chest Portable 1 View  Result Date: 04/20/2019 CLINICAL DATA:  Unresponsive, history of stroke and seizures EXAM: PORTABLE CHEST 1 VIEW COMPARISON:  04/10/2009  FINDINGS: The heart size and mediastinal contours are within normal limits. Both lungs are clear. The visualized skeletal structures are unremarkable. IMPRESSION: No active disease. Electronically Signed   By: Randa Ngo M.D.   On: 04/20/2019 00:42    Procedures Procedures (including critical care time)  Medications Ordered in ED Medications  sodium chloride 0.9 % bolus 500 mL (0 mLs Intravenous Stopped 04/19/19 2217)  levETIRAcetam (KEPPRA) 2,000 mg in sodium chloride 0.9 % 250 mL IVPB (2,000 mg Intravenous New Bag/Given 04/20/19 0127)    ED Course  I have reviewed the triage vital signs and the nursing notes.  Pertinent labs & imaging results that were available during my care of the patient were reviewed by me and considered in my medical decision making (see chart for details).  This patient presents with unresponsive episode, now gradually resolved back to baseline mental status .  This involves an extensive number of treatment options, and is a complaint that carries with it a high risk of complications and morbidity.  The differential diagnosis includes seizure vs pseudoseizure vs. Cardiac arrhythmia vs. Electrolyte derangement vs other  Most suspicious for seizure given his report of clonic activity per his wife, his incontinence, and what appears to have been a post-ictal state (even on his arrival), with improvement in mental status  Less likely PE with recovery to baseline, no pulmonary symptoms  Patient being evaluated for HOCM, but in my opinion this is less likely a cause of his symptoms given that this was non-exertional.  I am more suspicious for seizure.  Glucose wnl  Pending discussion with neurology, MRI  I ordered, reviewed, and interpreted labs, which included bloodtests, UA, xray I ordered imaging studies which included xray of the chest I independently visualized and interpreted imaging which showed no focal consolidation, no evidence of aspiration, and the  monitor tracing which showed NSR Additional history was obtained from the patient's wife who witnessed the events Previous records obtained and reviewed showing large 2011 infarct 2/2 hemorrhage I consulted neurology  and discussed lab and imaging findings - plan for MRI, IV keppra loading dose, and discussion after imaging  After the interventions stated above, I reevaluated the patient and found mental status back to baseline, vitals stable including blood pressure.    Clinical Course as of Apr 20 243  Sat Apr 20, 2019  0019 Ecg with NSR, no stemi   [MT]  0023 Mental status improved, no headache, wife at bedside, patient back to baseline.   [MT]  0028 Spoke to Dr. Cheral Marker of neurology who agrees that the patient is history suggestive of a stroke, and recommended that we obtain an MRI of the brain without contrast and MRI of the brain without contrast to rule out the possibility of recurrence of CVA.  He also recommended 2 g of IV Keppra.  We will touch base with him again after the imaging is completed.   [MT]  0028 Patient signed out to Dr Leonette Monarch the overnight EDP who will f/u on MR imaging and neuro recommendations.  Patient and his wife informed of the plan   [MT]    Clinical Course User Index [MT] Lakeysha Slutsky, Carola Rhine, MD    Final Clinical Impression(s) / ED Diagnoses Final diagnoses:  None    Rx / DC Orders ED Discharge Orders    None       Langston Masker Carola Rhine, MD 04/20/19 [phone]

## 2019-04-20 ENCOUNTER — Emergency Department (HOSPITAL_COMMUNITY): Payer: Medicare Other

## 2019-04-20 DIAGNOSIS — R55 Syncope and collapse: Secondary | ICD-10-CM | POA: Diagnosis not present

## 2019-04-20 DIAGNOSIS — I639 Cerebral infarction, unspecified: Secondary | ICD-10-CM | POA: Diagnosis not present

## 2019-04-20 LAB — URINALYSIS, ROUTINE W REFLEX MICROSCOPIC
Bacteria, UA: NONE SEEN
Bilirubin Urine: NEGATIVE
Glucose, UA: NEGATIVE mg/dL
Hgb urine dipstick: NEGATIVE
Ketones, ur: 5 mg/dL — AB
Leukocytes,Ua: NEGATIVE
Nitrite: NEGATIVE
Protein, ur: 30 mg/dL — AB
Specific Gravity, Urine: 1.011 (ref 1.005–1.030)
pH: 7 (ref 5.0–8.0)

## 2019-04-20 LAB — TROPONIN I (HIGH SENSITIVITY): Troponin I (High Sensitivity): 9 ng/L (ref <18)

## 2019-04-20 LAB — RAPID URINE DRUG SCREEN, HOSP PERFORMED
Amphetamines: NOT DETECTED
Barbiturates: NOT DETECTED
Benzodiazepines: NOT DETECTED
Cocaine: NOT DETECTED
Opiates: NOT DETECTED
Tetrahydrocannabinol: NOT DETECTED

## 2019-04-20 LAB — MAGNESIUM: Magnesium: 2.3 mg/dL (ref 1.7–2.4)

## 2019-04-20 MED ORDER — LEVETIRACETAM 500 MG PO TABS: 500.0000 mg | ORAL_TABLET | Freq: Two times a day (BID) | ORAL | 1 refills | Status: DC

## 2019-04-20 MED ORDER — SODIUM CHLORIDE 0.9 % IV SOLN
2000.0000 mg | Freq: Once | INTRAVENOUS | Status: AC
  Administered 2019-04-20: 2000 mg via INTRAVENOUS
  Filled 2019-04-20: qty 20

## 2019-04-20 NOTE — ED Notes (Signed)
Pt stood at bedside with assistance. Pt unable to take any steps, pt and family states he has a special pair of shoes he uses to walk in, this is his reason for depending on his wheelchair. Family states this is baseline for the pt.

## 2019-04-20 NOTE — ED Provider Notes (Signed)
I assumed care of this patient.  Please see previous provider note for further details of Hx, PE.  Briefly patient is a 64 y.o. male who presented after likely seizure, pending MRI and reassessment.    MRI negative for any acute CVAs Patient loaded with Keppra. Back to baseline. Spoke with Dr. Cheral Marker from neurology who recommended 500 twice daily Keppra and outpatient neurology follow-up.  The patient appears reasonably screened and/or stabilized for discharge and I doubt any other medical condition or other [facility] requiring further screening, evaluation, or treatment in the ED at this time prior to discharge. Safe for discharge with strict return precautions.  Disposition: Discharge  Condition: Good  I have discussed the results, Dx and Tx plan with the patient/family who expressed understanding and agree(s) with the plan. Discharge instructions discussed at length. The patient/family was given strict return precautions who verbalized understanding of the instructions. No further questions at time of discharge.    ED Discharge Orders         Ordered    levETIRAcetam (KEPPRA) 500 MG tablet  2 times daily     04/20/19 0524           Follow Up: Girtha Rm, NP-C [place] [address] [address] [facility] [address]     [place] [phone] Schedule an appointment as soon as possible for a visit  For close follow up to assess for seizure      Silena Wyss, Grayce Sessions, MD 04/20/19 [phone]

## 2019-04-20 NOTE — ED Notes (Signed)
Dr. Cardama at bedside.  

## 2019-04-20 NOTE — ED Notes (Signed)
Patient verbalized understanding of dc instructions, vss, taken out via wheelchair, nad.

## 2019-04-22 ENCOUNTER — Encounter: Payer: Self-pay | Admitting: Family Medicine

## 2019-04-24 ENCOUNTER — Ambulatory Visit: Payer: Medicare Other | Admitting: Neurology

## 2019-04-24 ENCOUNTER — Other Ambulatory Visit: Payer: Self-pay

## 2019-04-24 ENCOUNTER — Encounter: Payer: Self-pay | Admitting: Neurology

## 2019-04-24 VITALS — BP 136/79 | HR 60 | Temp 98.1°F | Ht 72.0 in | Wt 175.6 lb

## 2019-04-24 DIAGNOSIS — I610 Nontraumatic intracerebral hemorrhage in hemisphere, subcortical: Secondary | ICD-10-CM | POA: Diagnosis not present

## 2019-04-24 DIAGNOSIS — G40219 Localization-related (focal) (partial) symptomatic epilepsy and epileptic syndromes with complex partial seizures, intractable, without status epilepticus: Secondary | ICD-10-CM | POA: Diagnosis not present

## 2019-04-24 MED ORDER — LEVETIRACETAM 500 MG PO TABS: 500.0000 mg | ORAL_TABLET | Freq: Two times a day (BID) | ORAL | 1 refills | Status: DC

## 2019-04-24 NOTE — Patient Instructions (Signed)
I had a long discussion with the patient and his wife regarding his seizure which likely represents symptomatic epilepsy from his remote right intracerebral hemorrhage.  I agree with continuing Keppra 500 mg twice daily for seizure prophylaxis.  I discussed possible side effects with patient and wife and advised him to call me if he has them.  Check EEG.  Patient was advised to avoid seizure appropriate thing factors like sleep deprivation, medication noncompliance, stimulants and extremes of physical activity and diet.  He was advised to use his quad cane at all times for ambulation and fall and safety precautions.  He was advised not to drive as per [facility].  He will return for follow-up in the future in 3 months with my nurse practitioner Janett Billow or call earlier if necessary.  Epilepsy Epilepsy is when a person keeps having seizures. A seizure is a burst of abnormal activity in the brain. A seizure can change how you think or behave, and it can make it hard to be aware of what is happening. This condition can cause problems such as:  Falls, accidents, and injury.  Sadness (depression).  Poor memory.  Sudden unexplained death in epilepsy (SUDEP). This is rare. Its cause is not known. Most people with epilepsy lead normal lives. What are the causes? This condition may be caused by:  A head injury.  An injury that happens at birth.  A high fever during childhood.  A stroke.  Bleeding that goes into or around the brain.  Certain medicines and drugs.  Having too little oxygen for a long period of time.  Abnormal brain development.  Certain infections.  Brain tumors.  Conditions that are passed from parent to child (are hereditary). What are the signs or symptoms? Symptoms of a seizure vary from person to person. They may include:  Jerky movements of muscles (convulsions).  Stiffening of the body.  Movements of the arms or legs that you are not able to  control.  Passing out (loss of consciousness).  Breathing problems.  Sudden falls.  Confusion.  Head nodding.  Eye blinking or twitching.  Lip smacking.  Drooling.  Fast eye movements.  Grunting.  Not being able to control when you pee or poop.  Staring.  Being hard to wake up (unresponsiveness). Some people have symptoms right before a seizure happens (aura) and right after a seizure happens. These symptoms include:  Fear or anxiety.  Feeling sick to your stomach (nauseous).  Feeling like the room is spinning (vertigo).  A feeling of having seen or heard something before (dj vu).  Odd tastes or smells.  Changes in how you see (vision), such as seeing flashing lights or spots. Symptoms that follow a seizure include:  Being confused.  Being sleepy.  Having a headache. How is this treated? Treatment can control seizures. Treatment for this condition may involve:  Taking medicines to control seizures.  Having a device (vagus nerve stimulator) put in the chest. The device sends signals to a nerve and to the brain to prevent seizures.  Brain surgery to stop seizures from happening or to reduce how often they happen.  Having blood tests often to make sure you are getting the right amount of medicine. Once this condition has been diagnosed, it is important to start treatment as soon as possible. For some people, epilepsy goes away in time. Others will need treatment for the rest of their life. Follow these instructions at home: Medicines  Take over-the-counter and prescription medicines  only as told by your doctor.  Avoid anything that may keep your medicine from working, such as alcohol. Activity  Get enough rest. Lack of sleep can make seizures more likely to occur.  Follow your doctor's advice about driving, swimming, and doing anything else that would be dangerous if you had a seizure. ? If you live in the [place] check with your local DMV (department  of motor vehicles) to find out about local driving laws. Each state has rules about when you can return to driving. Teaching others Teach friends and family what to do if you have a seizure. They should:  Lay you on the ground to prevent a fall.  Cushion your head and body.  Loosen any tight clothing around your neck.  Turn you on your side.  Stay with you until you are better.  Not hold you down.  Not put anything in your mouth.  Know whether or not you need emergency care.  General instructions  Avoid anything that causes you to have seizures.  Keep a seizure diary. Write down what you remember about each seizure. Be sure to include what might have caused it.  Keep all follow-up visits as told by your doctor. This is important. Contact a doctor if:  You have a change in how often or when you have seizures.  You get an infection or start to feel sick. You may have more seizures when you are sick. Get help right away if:  A seizure does not stop after 5 minutes.  You have more than one seizure in a row, and you do not have enough time between the seizures to feel better.  A seizure makes it harder to breathe.  A seizure is different from other seizures you have had.  A seizure makes you unable to speak or use a part of your body.  You did not wake up right after a seizure. These symptoms may be an emergency. Do not wait to see if the symptoms will go away. Get medical help right away. Call your local emergency services (911 in the [place] Do not drive yourself to the hospital. Summary  Epilepsy is when a person keeps having seizures. A seizure is a burst of abnormal activity in the brain.  Treatment can control seizures.  Teach friends and family what to do if you have a seizure. This information is not intended to replace advice given to you by your health care provider. Make sure you discuss any questions you have with your health care provider. Document  Revised: 08/14/2017 Document Reviewed: 08/14/2017 Elsevier Patient Education  2020 [employer].

## 2019-04-24 NOTE — Progress Notes (Signed)
[facility] [address] [place]. [place] [address] [phone]       OFFICE CONSULT NOTE  [name] Date of Birth:  1955/03/05 Medical Record Number:  [id number]   Referring MD: Octaviano Glow Reason for Referral: Seizure  HPI: Steve Burton is a 64 year old Caucasian male seen today for initial office consultation visit for seizure.  He is accompanied by his wife.  History is obtained from them, review of ER electronic medical records and I personally reviewed available imaging films in PACS.  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  He had a witnessed episode of seizure on 04/20/2019.  Patient was sitting on a computer listening to music concert when his wife was in the next room heard a loud sound and when she arrived she found him lying on the floor leaning to 1 side and having jerking movements.  His eyes were partially open but he had a glazed look and was unresponsive.  He had a tongue bite and he also had urinary incontinence.  She called EMS patient remained unresponsive while they arrived and did not regain consciousness till he reached the hospital.  He subsequently gradually returned back to his baseline. MRI scan of the brain was obtained on 04/20/2019 which showed no acute abnormality showed large area of encephalomalacia in the right basal ganglia extending up into the corona radiata with gliosis.  MRI of the brain showed no large vessel stenosis or occlusion.  Urine analysis was negative.  Basic metabolic panel labs unremarkable.  CBC and creatinine kinase were also normal.  Urine drug screen was negative.  Patient was given IV Keppra and discharged home with Keppra 500 mg twice daily.  Patient states he is taken it for last 3 days he had a episode of emotional lability and cried yesterday but feels fine now.  He denies dizziness or other side  effects.  I do not have access to the patient's previous admission in 2011 for intracerebral hemorrhage with apparently I saw the patient at that time.  The wife feels that he may have had a seizure at that time but was not discharged on any anticonvulsants.  He has no other history of hyper of head injury with loss of consciousness or seizures.  His blood pressure has apparently been well controlled and he takes labetalol , Cozaar and Aldactone.  Today blood pressure is acceptable at 136/79 .  Patient denies any office triggers for his seizures in the form of fever, infection, dehydration or sleep deprivation or alcohol use.  He has significant residual spastic left hemiplegia from his address of the hemorrhage in 2011 and is able to walk with a hemiwalker but does not have much use of his left arm.  He has a left foot drop.  His wife still keeps a close watch on him but is able to do most activities for himself. ROS:   14 system review of systems is positive for loss of consciousness, tongue bite, incontinence, disorientation, seizure, stiffness, gait difficulty, foot drop and all other systems negative  PMH:  Past Medical History:  Diagnosis Date  . Elevated LDL cholesterol level   . Elevated PSA measurement 11/22/2016  . Hypertension   . Impaired fasting glucose 11/22/2016  . Stroke [facility]) 2011    Social History:  Social History   Socioeconomic History  . Marital status: Married    Spouse name: Not  on file  . Number of children: Not on file  . Years of education: Not on file  . Highest education level: Not on file  Occupational History  . Not on file  Tobacco Use  . Smoking status: Never Smoker  . Smokeless tobacco: Never Used  Substance and Sexual Activity  . Alcohol use: Yes    Alcohol/week: 0.0 standard drinks    Comment: 1 glass of wine per week.   . Drug use: No  . Sexual activity: Not on file  Other Topics Concern  . Not on file  Social History Narrative  . Not on file     Social Determinants of Health   Financial Resource Strain:   . Difficulty of Paying Living Expenses:   Food Insecurity:   . Worried About Charity fundraiser in the Last Year:   . Arboriculturist in the Last Year:   Transportation Needs:   . Film/video editor (Medical):   Marland Kitchen Lack of Transportation (Non-Medical):   Physical Activity:   . Days of Exercise per Week:   . Minutes of Exercise per Session:   Stress:   . Feeling of Stress :   Social Connections:   . Frequency of Communication with Friends and Family:   . Frequency of Social Gatherings with Friends and Family:   . Attends Religious Services:   . Active Member of Clubs or Organizations:   . Attends Archivist Meetings:   Marland Kitchen Marital Status:   Intimate Partner Violence:   . Fear of Current or Ex-Partner:   . Emotionally Abused:   Marland Kitchen Physically Abused:   . Sexually Abused:     Medications:   Current Outpatient Medications on File Prior to Visit  Medication Sig Dispense Refill  . atorvastatin (LIPITOR) 10 MG tablet TAKE 1 TABLET BY MOUTH  DAILY (Patient taking differently: Take 10 mg by mouth every evening. ) 90 tablet 1  . baclofen (LIORESAL) 10 MG tablet TAKE 1 TABLET BY MOUTH 3  TIMES DAILY (Patient taking differently: Take 10 mg by mouth 3 (three) times daily. ) 270 tablet 0  . diltiazem (TIAZAC) 240 MG 24 hr capsule TAKE ONE CAPSULE BY MOUTH DAILY (Patient taking differently: Take 240 mg by mouth daily. ) 90 capsule 3  . labetalol (NORMODYNE) 100 MG tablet TAKE 1 TABLET BY MOUTH TWO  TIMES DAILY (Patient taking differently: Take 100 mg by mouth 2 (two) times daily. ) 180 tablet 3  . labetalol (NORMODYNE) 200 MG tablet TAKE 1 TABLET BY MOUTH TWO  TIMES DAILY (Patient taking differently: Take 200 mg by mouth 2 (two) times daily. ) 180 tablet 3  . losartan (COZAAR) 100 MG tablet TAKE 1 TABLET BY MOUTH  DAILY (Patient taking differently: Take 100 mg by mouth daily. ) 90 tablet 1  . Multiple Vitamins-Minerals  (CENTRUM SILVER 50+MEN) TABS Take 1 tablet by mouth daily with lunch.    . potassium chloride SA (KLOR-CON M20) 20 MEQ tablet Take 2 tablets (40 mEq total) by mouth daily. (Patient taking differently: Take 40 mEq by mouth daily with lunch. ) 180 tablet 3  . spironolactone (ALDACTONE) 25 MG tablet Take 1 tablet (25 mg total) by mouth daily. Please keep upcoming appt for refills. Thank you (Patient taking differently: Take 25 mg by mouth daily. ) 90 tablet 0   No current facility-administered medications on file prior to visit.    Allergies:   Allergies  Allergen Reactions  . Phenytoin Sodium Extended  Swelling and Other (See Comments)    Dilantin- Arm swelling   . Penicillins Rash    Has patient had a PCN reaction causing immediate rash, facial/tongue/throat swelling, SOB or lightheadedness with hypotension: Unknown Has patient had a PCN reaction causing severe rash involving mucus membranes or skin necrosis: Unk Has patient had a PCN reaction that required hospitalization: Unk Has patient had a PCN reaction occurring within the last 10 years: Yes If all of the above answers are "NO", then may proceed with Cephalosporin use.     Physical Exam General: well developed, well nourished middle-aged Caucasian male, seated, in no evident distress Head: head normocephalic and atraumatic.   Neck: supple with no carotid or supraclavicular bruits Cardiovascular: regular rate and rhythm, no murmurs Musculoskeletal: no deformity except left foot drop Skin:  no rash/petichiae Vascular:  Normal pulses all extremities  Neurologic Exam Mental Status: Awake and fully alert. Oriented to place and time. Recent and remote memory intact. Attention span, concentration and fund of knowledge appropriate. Mood and affect appropriate.  Cranial Nerves: Fundoscopic exam reveals sharp disc margins. Pupils equal, briskly reactive to light. Extraocular movements full without nystagmus but saccadic dysmetria on right  lateral gaze.. Visual fields full to confrontation. Hearing intact. Facial sensation intact.  Mild left lower facial asymmetry when he smiles only.  Tongue, palate moves normally and symmetrically.  Motor: Spastic left hemiplegia with left upper extremity 0/5 strength with minimal movement of the left shoulder only.  Left lower extremity drift with 4/5 strength proximally at the hip and knees with left ankle foot drop with ankle dorsiflexors 0/5 and plantar flexors 1/5.  Spasticity with increased tone on the left side.  L sustained left ankle clonus.  Normal strength on the right side. Sensory.:  Diminished left hemibody touch , pinprick , position and vibratory sensation.  Coordination: Rapid alternating movements normal in all extremities. Finger-to-nose and heel-to-shin performed accurately bilaterally. Gait and Station: Deferred as patient did not bring his hemiwalker.   Reflexes: 2+ and asymmetric and brisker on the left. Toes downgoing.   NIHSS  7 Modified Rankin  3  ASSESSMENT: 64 year old Caucasian male with solitary episode of weakness partial onset seizures with secondary generalization likely symptomatic from remote hypertensive right basal ganglia intracerebral hemorrhage with late effect.     PLAN: I had a long discussion with the patient and his wife regarding his seizure which likely represents symptomatic epilepsy from his remote right intracerebral hemorrhage.  I agree with continuing Keppra 500 mg twice daily for seizure prophylaxis.  I discussed possible side effects with patient and wife and advised him to call me if he has them.  Check EEG.  Patient was advised to avoid seizure appropriate thing factors like sleep deprivation, medication noncompliance, stimulants and extremes of physical activity and diet.  He was advised to use his quad cane at all times for ambulation and fall and safety precautions.  He was advised not to drive as per [facility].  Greater than 50% time  during this 50-minute consultation visit was spent on counseling and coordination of care about his seizures and symptomatic epilepsy and remote intracerebral hemorrhage and answering questions.  He will return for follow-up in the future in 3 months with my nurse practitioner Janett Billow or call earlier if necessary. Antony Contras, MD  [facility] [address] [place], [place] [address]  Phone [phone] Fax [phone] Note: This document was prepared with digital dictation and possible smart phrase technology. Any transcriptional  errors that result from this process are unintentional.

## 2019-05-08 ENCOUNTER — Other Ambulatory Visit: Payer: Self-pay | Admitting: Family Medicine

## 2019-05-08 ENCOUNTER — Other Ambulatory Visit: Payer: Self-pay | Admitting: Cardiology

## 2019-05-08 DIAGNOSIS — I10 Essential (primary) hypertension: Secondary | ICD-10-CM

## 2019-05-15 ENCOUNTER — Other Ambulatory Visit: Payer: Self-pay | Admitting: Family Medicine

## 2019-05-15 ENCOUNTER — Encounter: Payer: Self-pay | Admitting: Family Medicine

## 2019-05-15 NOTE — Telephone Encounter (Signed)
Pt has an appt coming up 

## 2019-05-15 NOTE — Telephone Encounter (Signed)
Ok to refill 

## 2019-05-15 NOTE — Telephone Encounter (Signed)
Is this okay to refill?   I left message for pt to call back to schedule AWV

## 2019-05-20 ENCOUNTER — Other Ambulatory Visit: Payer: Self-pay

## 2019-05-20 ENCOUNTER — Ambulatory Visit: Payer: Medicare Other | Admitting: Neurology

## 2019-05-20 DIAGNOSIS — G40219 Localization-related (focal) (partial) symptomatic epilepsy and epileptic syndromes with complex partial seizures, intractable, without status epilepticus: Secondary | ICD-10-CM

## 2019-05-28 NOTE — Progress Notes (Signed)
Kindly inform the patient that EEG study showed mild slowing of the brain activity on the right side which may be related to the sequelae of prior stroke.  No definite seizure activity noted

## 2019-06-04 NOTE — Progress Notes (Signed)
Steve Burton is a 64 y.o. male who presents for annual wellness visit,  CPE and follow-up on chronic medical conditions.  He has the following concerns:  Denies any concerns.  States he feels well. He is here with his wife. Sitting in a wheelchair.   Hx of elevated PSA and did not follow up with urology as recommended.    Immunization History  Administered Date(s) Administered  . Moderna SARS-COVID-2 Vaccination 04/30/2019, 05/28/2019  . Td 07/25/2014   Last colonoscopy: 2016 and due for recall in 2026   Last PSA: 2019 Dentist: 6 every months Ophtho: Dr. Katy Fitch Exercise: walking around house with cane  Other doctors caring for patient include: Dr. Leonie Man- Neurology Dr. Stanford Breed- Cardiology Urologist- [facility]  Dermatologist-     Depression screen:  See questionnaire below.     Depression screen [facility] 2/9 06/05/2019 04/05/2018 11/21/2016 07/25/2014 05/16/2013  Decreased Interest 0 0 0 0 0  Down, Depressed, Hopeless 0 0 0 0 0  PHQ - 2 Score 0 0 0 0 0    Fall Screen: See Questionaire below.   Fall Risk  06/05/2019 04/05/2018 11/21/2016 05/16/2013  Falls in the past year? 0 0 Yes No  Number falls in past yr: 0 0 1 -  Injury with Fall? 0 0 No -  Risk for fall due to : - - - Impaired mobility;Impaired balance/gait    ADL screen:  See questionnaire below.  Functional Status Survey: Functional Status Survey: Is the patient deaf or have difficulty hearing?: No Does the patient have difficulty seeing, even when wearing glasses/contacts?: No Does the patient have difficulty concentrating, remembering, or making decisions?: No Does the patient have difficulty walking or climbing stairs?: Yes Does the patient have difficulty dressing or bathing?: Yes Does the patient have difficulty doing errands alone such as visiting a doctor's office or shopping?: Yes    End of Life Discussion:  Patient does not have a living will and medical power of attorney. MOST form filled out. Advance directives  discussed and paperwork given.    Review of Systems  Constitutional: -fever, -chills, -sweats, -unexpected weight change, -anorexia, -fatigue Allergy: -sneezing, -itching, -congestion Dermatology: denies changing moles, rash, lumps, new worrisome lesions ENT: -runny nose, -ear pain, -sore throat, -hoarseness, -sinus pain, -teeth pain, -tinnitus, + left chronic mild hearing loss, -epistaxis Cardiology:  -chest pain, -palpitations, -edema, -orthopnea, -paroxysmal nocturnal dyspnea Respiratory: -cough, -shortness of breath, -dyspnea on exertion, -wheezing, -hemoptysis Gastroenterology: -abdominal pain, -nausea, -vomiting, -diarrhea, -constipation, -blood in stool, -changes in bowel movement, -dysphagia Hematology: -bleeding or bruising problems Musculoskeletal: -arthralgias, -myalgias, -joint swelling, -back pain, -neck pain, -cramping, -gait changes Ophthalmology: -vision changes, -eye redness, -itching, -discharge Urology: -dysuria, -difficulty urinating, -hematuria, -urinary frequency, -urgency, incontinence Neurology: -headache, -weakness, -tingling, -numbness, -speech abnormality, -memory loss, -falls, -dizziness Psychology:  -depressed mood, -agitation, -sleep problems   PHYSICAL EXAM:  BP 120/80   Pulse (!) 55   Ht 6' (1.829 m)   Wt 171 lb 6.4 oz (77.7 kg)   BMI 23.25 kg/m   General Appearance: Alert, cooperative, no distress, appears stated age Head: Normocephalic, without obvious abnormality, atraumatic Eyes: PERRL, conjunctiva/corneas clear, EOM's intact Ears: Normal TM's and external ear canals Nose: mask in place  Throat: mask in place  Neck: Supple, no lymphadenopathy, thyroid:no enlargement/tenderness/nodules Back: Spine nontender, no curvature, ROM normal, no CVA tenderness Lungs: Clear to auscultation bilaterally without wheezes, rales or ronchi; respirations unlabored Chest Wall: No tenderness or deformity Heart: Regular rate and rhythm, S1 and S2 normal, no  murmur, rub or gallop Breast Exam: No chest wall tenderness, masses or gynecomastia Abdomen: Soft, non-tender, nondistended, normoactive bowel sounds, no masses, no hepatosplenomegaly Genitalia: declines. Extremities: No clubbing, cyanosis or edema. Left upper and left lower extremities flaccid Pulses: 2+ and symmetric all extremities Skin: Skin color, texture, turgor normal, no rashes or lesions Lymph nodes: Cervical, supraclavicular, and axillary nodes normal Neurologic: CNII-XII intact, sensation and ROM intact on right but absent on left due to CVA Psych: Normal mood, affect, hygiene and grooming  ASSESSMENT/PLAN: Medicare annual wellness visit, subsequent -He is here today with his wife for his Medicare wellness visit.  He denies depression, falls, memory changes or concerns with his ADLs.  He is able to ambulate with a cane inside his home but otherwise he is in a wheelchair when he leaves his home.  History of CVA that left him with left-sided hemiparesis. Reports being in good spirits.  Advance directive counseling done.  MOST form filled out and signed  Routine general medical examination at a health care facility - Plan: Basic metabolic panel, TSH, T4, free -He is also here today for a fasting CPE.  Preventive health care reviewed and he is up-to-date on colonoscopy.  History of elevated PSA and he was referred to urology.  It seems that he did not follow-up as recommended for recheck of his PSA.  He would like this done today.  Immunizations reviewed.  Discussed safety and health promotion.  Counseling on healthy lifestyle including diet and exercise.  He is able to go to the gym and do several machines.  Essential hypertension -Blood pressure controlled.  Continue current medications.  Followed by cardiology.  History of hemorrhagic cerebrovascular accident (CVA) with residual deficit -Recent evaluation by neurology for new onset seizures and he is now on Keppra.  States he was  told that he did not have a new CVA.  Left spastic hemiparesis ([place]) -He takes baclofen for spasms.  Continues home physical therapy exercises and works out at his gym.  Elevated PSA measurement - Plan: PSA -Recheck PSA and refer back to urology as appropriate  Elevated LDL cholesterol level - Plan: Lipid panel -Continue statin therapy.  Counseling on healthy diet  Screening for thyroid disorder - Plan: TSH, T4, free -Follow-up pending results    Discussed PSA screening (risks/benefits), recommended at least 30 minutes of aerobic activity at least 5 days/week; proper sunscreen use reviewed; healthy diet and alcohol recommendations (less than or equal to 2 drinks/day) reviewed; regular seatbelt use; changing batteries in smoke detectors. Immunization recommendations discussed.  Colonoscopy recommendations reviewed.   Medicare Attestation I have personally reviewed: The patient's medical and social history Their use of alcohol, tobacco or illicit drugs Their current medications and supplements The patient's functional ability including ADLs,fall risks, home safety risks, cognitive, and hearing and visual impairment Diet and physical activities Evidence for depression or mood disorders  The patient's weight, height, and BMI have been recorded in the chart.  I have made referrals, counseling, and provided education to the patient based on review of the above and I have provided the patient with a written personalized care plan for preventive services.     Harland Dingwall, NP-C   06/05/2019

## 2019-06-05 ENCOUNTER — Ambulatory Visit (INDEPENDENT_AMBULATORY_CARE_PROVIDER_SITE_OTHER): Payer: Medicare Other | Admitting: Family Medicine

## 2019-06-05 ENCOUNTER — Encounter: Payer: Self-pay | Admitting: Family Medicine

## 2019-06-05 VITALS — BP 120/80 | HR 55 | Ht 72.0 in | Wt 171.4 lb

## 2019-06-05 DIAGNOSIS — G8114 Spastic hemiplegia affecting left nondominant side: Secondary | ICD-10-CM | POA: Diagnosis not present

## 2019-06-05 DIAGNOSIS — I10 Essential (primary) hypertension: Secondary | ICD-10-CM

## 2019-06-05 DIAGNOSIS — Z0000 Encounter for general adult medical examination without abnormal findings: Secondary | ICD-10-CM | POA: Diagnosis not present

## 2019-06-05 DIAGNOSIS — E7800 Pure hypercholesterolemia, unspecified: Secondary | ICD-10-CM

## 2019-06-05 DIAGNOSIS — R972 Elevated prostate specific antigen [PSA]: Secondary | ICD-10-CM | POA: Diagnosis not present

## 2019-06-05 DIAGNOSIS — Z1329 Encounter for screening for other suspected endocrine disorder: Secondary | ICD-10-CM | POA: Diagnosis not present

## 2019-06-05 DIAGNOSIS — I6930 Unspecified sequelae of cerebral infarction: Secondary | ICD-10-CM

## 2019-06-05 NOTE — Patient Instructions (Signed)
  [name] , Thank you for taking time to come for your Medicare Wellness Visit. I appreciate your ongoing commitment to your health goals. Please review the following plan we discussed and let me know if I can assist you in the future.   These are the goals we discussed: Continue with a healthy diet and exercise.   Continue on your current medications.   I will be in touch with your lab results.     This is a list of the screening recommended for you and due dates:  Health Maintenance  Topic Date Due  . HIV Screening  Never done  . Flu Shot  08/04/2019  . Tetanus Vaccine  07/24/2024  . Colon Cancer Screening  10/14/2024  . COVID-19 Vaccine  Completed  .  Hepatitis C: One time screening is recommended by Center for Disease Control  (CDC) for  adults born from [birth year] through 1963.   Completed

## 2019-06-06 LAB — BASIC METABOLIC PANEL
BUN/Creatinine Ratio: 14 (ref 10–24)
BUN: 14 mg/dL (ref 8–27)
CO2: 25 mmol/L (ref 20–29)
Calcium: 9.7 mg/dL (ref 8.6–10.2)
Chloride: 103 mmol/L (ref 96–106)
Creatinine, Ser: 1 mg/dL (ref 0.76–1.27)
GFR calc Af Amer: 93 mL/min/{1.73_m2} (ref >59)
GFR calc non Af Amer: 80 mL/min/{1.73_m2} (ref >59)
Glucose: 84 mg/dL (ref 65–99)
Potassium: 4.1 mmol/L (ref 3.5–5.2)
Sodium: 142 mmol/L (ref 134–144)

## 2019-06-06 LAB — LIPID PANEL
Chol/HDL Ratio: 3 ratio (ref 0.0–5.0)
Cholesterol, Total: 152 mg/dL (ref 100–199)
HDL: 51 mg/dL (ref >39)
LDL Chol Calc (NIH): 86 mg/dL (ref 0–99)
Triglycerides: 75 mg/dL (ref 0–149)
VLDL Cholesterol Cal: 15 mg/dL (ref 5–40)

## 2019-06-06 LAB — T4, FREE: Free T4: 1.35 ng/dL (ref 0.82–1.77)

## 2019-06-06 LAB — TSH: TSH: 1.46 u[IU]/mL (ref 0.450–4.500)

## 2019-06-06 LAB — PSA: Prostate Specific Ag, Serum: 1.8 ng/mL (ref 0.0–4.0)

## 2019-07-13 ENCOUNTER — Encounter: Payer: Self-pay | Admitting: Family Medicine

## 2019-07-18 ENCOUNTER — Other Ambulatory Visit: Payer: Self-pay

## 2019-07-18 ENCOUNTER — Ambulatory Visit (INDEPENDENT_AMBULATORY_CARE_PROVIDER_SITE_OTHER): Payer: Medicare Other | Admitting: Family Medicine

## 2019-07-18 ENCOUNTER — Encounter: Payer: Self-pay | Admitting: Family Medicine

## 2019-07-18 VITALS — BP 112/68 | HR 58 | Temp 98.0°F

## 2019-07-18 DIAGNOSIS — H6123 Impacted cerumen, bilateral: Secondary | ICD-10-CM

## 2019-07-18 NOTE — Progress Notes (Signed)
   Subjective:    Patient ID: Steve Burton, male    DOB: 12-Oct-1955, 64 y.o.   MRN: [id number]  HPI Chief Complaint  Patient presents with  . ear fullness   He is here with his wife due to decreased hearing and ears feeling clogged bilaterally for the past few days.  States he often has issues with wax buildup and has to get his ears cleaned out. Denies fever, chills, headache, nasal congestion, rhinorrhea, sore throat, chest pain, palpitations, shortness of breath.   Review of Systems Pertinent positives and negatives in the history of present illness.     Objective:   Physical Exam BP 112/68   Pulse (!) 58   Temp 98 F (36.7 C)   Alert and oriented and in no acute distress.  Bilateral ear canals impacted with cerumen prior to lavage.  Post lavage bilateral TMs and ear canals normal in appearance.  Gross hearing did improve.      Assessment & Plan:  Bilateral impacted cerumen  He is in his usual state of health except for cerumen impaction bilaterally.  Ear lavage performed by my CMA, Sabrina, and he tolerated this well.  Exam following lavage showed normal TMs and ear canals.  Hearing improved.

## 2019-07-25 ENCOUNTER — Ambulatory Visit: Payer: Medicare Other | Admitting: Adult Health

## 2019-07-25 ENCOUNTER — Other Ambulatory Visit: Payer: Self-pay | Admitting: Family Medicine

## 2019-07-25 ENCOUNTER — Encounter: Payer: Self-pay | Admitting: Adult Health

## 2019-07-25 VITALS — BP 127/76 | HR 59 | Ht 71.0 in | Wt 172.0 lb

## 2019-07-25 DIAGNOSIS — I10 Essential (primary) hypertension: Secondary | ICD-10-CM

## 2019-07-25 DIAGNOSIS — I610 Nontraumatic intracerebral hemorrhage in hemisphere, subcortical: Secondary | ICD-10-CM

## 2019-07-25 DIAGNOSIS — G40219 Localization-related (focal) (partial) symptomatic epilepsy and epileptic syndromes with complex partial seizures, intractable, without status epilepticus: Secondary | ICD-10-CM | POA: Diagnosis not present

## 2019-07-25 MED ORDER — LEVETIRACETAM 500 MG PO TABS: 500.0000 mg | ORAL_TABLET | Freq: Two times a day (BID) | ORAL | 3 refills | Status: DC

## 2019-07-25 NOTE — Progress Notes (Signed)
[facility] [address] [place]. [place] [address] [phone]       OFFICE FOLLOW UP NOTE  [name] Date of Birth:  [birth year] Medical Record Number:  [id number]   [facility] provider: Dr. Leonie Man Referring MD: Octaviano Glow Reason for Referral: Seizure    CC: Seizure   HPI:   Today, 07/25/2019, [name] returns for seizure follow-up accompanied by his wife [name].  He has been stable since prior visit without recurrent seizure activity or episodes.  Remains on Keppra 500 mg twice daily tolerating well.  Repeat EEG 05/20/2019 showed focal right hemispheric slowing likely related to sequela of prior stroke without definite seizure activity noted.  Remote history of right BG ICH in 2011 with residual spastic left hemiparesis which has been stable. Denies new stroke/TIA symptoms.  Blood pressure today 127/76.  No concerns at this time.     History provided for reference purposes only Initial visit 04/24/2019 Dr. Leonie Man: [name] is a 64 year old Caucasian male seen today for initial office consultation visit for seizure.  He is accompanied by his wife.  History is obtained from them, review of ER electronic medical records and I personally reviewed available imaging films in PACS.  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  He had a witnessed episode of seizure on 04/20/2019.  Patient was sitting on a computer listening to music concert when his wife was in the next room heard a loud sound and when she arrived she found him lying on the floor leaning to 1 side and having jerking movements.  His eyes were partially open but he had a glazed look and was unresponsive.  He had a tongue bite and he also had urinary incontinence.  She called EMS patient remained unresponsive while they arrived and did not regain consciousness till he reached the hospital.  He  subsequently gradually returned back to his baseline. MRI scan of the brain was obtained on 04/20/2019 which showed no acute abnormality showed large area of encephalomalacia in the right basal ganglia extending up into the corona radiata with gliosis.  MRI of the brain showed no large vessel stenosis or occlusion.  Urine analysis was negative.  Basic metabolic panel labs unremarkable.  CBC and creatinine kinase were also normal.  Urine drug screen was negative.  Patient was given IV Keppra and discharged home with Keppra 500 mg twice daily.  Patient states he is taken it for last 3 days he had a episode of emotional lability and cried yesterday but feels fine now.  He denies dizziness or other side effects.  I do not have access to the patient's previous admission in 2011 for intracerebral hemorrhage with apparently I saw the patient at that time.  The wife feels that he may have had a seizure at that time but was not discharged on any anticonvulsants.  He has no other history of hyper of head injury with loss of consciousness or seizures.  His blood pressure has apparently been well controlled and he takes labetalol , Cozaar and Aldactone.  Today blood pressure is acceptable at 136/79 .  Patient denies any office triggers for his seizures in the form of fever, infection, dehydration or sleep deprivation or alcohol use.  He has significant residual spastic left hemiplegia from his address of the hemorrhage in 2011 and is able to walk with a hemiwalker but does not have much use  of his left arm.  He has a left foot drop.  His wife still keeps a close watch on him but is able to do most activities for himself.  ROS:   14 system review of systems is positive for seizure, stiffness, gait difficulty, foot drop and all other systems negative  PMH:  Past Medical History:  Diagnosis Date   Elevated LDL cholesterol level    Elevated PSA measurement 11/22/2016   Hypertension    Impaired fasting glucose  11/22/2016   Stroke [facility]) 2011    Social History:  Social History   Socioeconomic History   Marital status: Married    Spouse name: Not on file   Number of children: Not on file   Years of education: Not on file   Highest education level: Not on file  Occupational History   Not on file  Tobacco Use   Smoking status: Never Smoker   Smokeless tobacco: Never Used  Substance and Sexual Activity   Alcohol use: Yes    Alcohol/week: 0.0 standard drinks    Comment: 1 glass of wine per week.    Drug use: No   Sexual activity: Not on file  Other Topics Concern   Not on file  Social History Narrative   Not on file   Social Determinants of Health   Financial Resource Strain:    Difficulty of Paying Living Expenses:   Food Insecurity:    Worried About [place] in the Last Year:    Arboriculturist in the Last Year:   Transportation Needs:    Film/video editor (Medical):    Lack of Transportation (Non-Medical):   Physical Activity:    Days of Exercise per Week:    Minutes of Exercise per Session:   Stress:    Feeling of Stress :   Social Connections:    Frequency of Communication with Friends and Family:    Frequency of Social Gatherings with Friends and Family:    Attends Religious Services:    Active Member of Clubs or Organizations:    Attends Music therapist:    Marital Status:   Intimate Partner Violence:    Fear of Current or Ex-Partner:    Emotionally Abused:    Physically Abused:    Sexually Abused:     Medications:   Current Outpatient Medications on File Prior to Visit  Medication Sig Dispense Refill   atorvastatin (LIPITOR) 10 MG tablet TAKE 1 TABLET BY MOUTH  DAILY (Patient taking differently: Take 10 mg by mouth every evening. ) 90 tablet 1   baclofen (LIORESAL) 10 MG tablet Take 1 tablet (10 mg total) by mouth 3 (three) times daily. 270 tablet 0   diltiazem (TIAZAC) 240 MG 24 hr capsule TAKE  ONE CAPSULE BY MOUTH DAILY (Patient taking differently: Take 240 mg by mouth daily. ) 90 capsule 3   labetalol (NORMODYNE) 100 MG tablet TAKE 1 TABLET BY MOUTH  TWICE DAILY 180 tablet 3   labetalol (NORMODYNE) 200 MG tablet TAKE 1 TABLET BY MOUTH  TWICE DAILY 180 tablet 3   losartan (COZAAR) 100 MG tablet TAKE 1 TABLET BY MOUTH  DAILY 90 tablet 0   Multiple Vitamins-Minerals (CENTRUM SILVER 50+MEN) TABS Take 1 tablet by mouth daily with lunch.     potassium chloride SA (KLOR-CON M20) 20 MEQ tablet Take 2 tablets (40 mEq total) by mouth daily. (Patient taking differently: Take 40 mEq by mouth daily with lunch. ) 180 tablet  3   spironolactone (ALDACTONE) 25 MG tablet Take 1 tablet (25 mg total) by mouth daily. Please keep upcoming appt for refills. Thank you (Patient taking differently: Take 25 mg by mouth daily. ) 90 tablet 0   No current facility-administered medications on file prior to visit.    Allergies:   Allergies  Allergen Reactions   Phenytoin Sodium Extended Swelling and Other (See Comments)    Dilantin- Arm swelling    Penicillins Rash    Has patient had a PCN reaction causing immediate rash, facial/tongue/throat swelling, SOB or lightheadedness with hypotension: Unknown Has patient had a PCN reaction causing severe rash involving mucus membranes or skin necrosis: Unk Has patient had a PCN reaction that required hospitalization: Unk Has patient had a PCN reaction occurring within the last 10 years: Yes If all of the above answers are "NO", then may proceed with Cephalosporin use.      Today's Vitals   07/25/19 0750  BP: 127/76  Pulse: (!) 59  Weight: 172 lb (78 kg)  Height: 5\' 11"  (1.803 m)   Body mass index is 23.99 kg/m.   Physical Exam General: well developed, well nourished very pleasant middle-aged Caucasian male, seated, in no evident distress Head: head normocephalic and atraumatic.   Neck: supple with no carotid or supraclavicular  bruits Cardiovascular: regular rate and rhythm, no murmurs Musculoskeletal: no deformity except left foot drop Skin:  no rash/petichiae Vascular:  Normal pulses all extremities  Neurologic Exam Mental Status: Awake and fully alert. Oriented to place and time. Recent and remote memory intact. Attention span, concentration and fund of knowledge appropriate. Mood and affect appropriate.  Cranial Nerves: Pupils equal, briskly reactive to light. Extraocular movements full without nystagmus but saccadic dysmetria on right lateral gaze.. Visual fields full to confrontation. Hearing intact. Facial sensation intact.  Mild left lower facial asymmetry when he smiles only.  Tongue, palate moves normally and symmetrically.  Motor: Spastic left hemiparesis with left upper extremity 1/5 proximally with no movement of hand.  Left lower extremity drift with 4/5 strength proximally at the hip and knees with left ankle foot drop with ankle dorsiflexors 0/5 and plantar flexors 1/5.  Spasticity with increased tone on the left side.  L sustained left ankle clonus.  Normal strength on the right side. Sensory.:  Diminished left hemibody touch , pinprick , position and vibratory sensation.  Coordination: Rapid alternating movements normal on right side. Finger-to-nose and heel-to-shin performed accurately on right side. Gait and Station: Deferred as patient did not bring his hemiwalker.   Reflexes: 2+ and asymmetric and brisker on the left. Toes downgoing.      ASSESSMENT/PLAN: 64 year old Caucasian male with solitary episode of weakness partial onset seizures with secondary generalization likely symptomatic from remote hypertensive right basal ganglia intracerebral hemorrhage with late effect.   1.  Seizure, late effect of stroke -Denies recurrent seizure activity or episode -Continue Keppra 500 mg twice daily for seizure prophylaxis -refill provided -Educated on avoidance of seizure provoking factors like sleep  deprivation, medication noncompliance, stimulants and extremes of physical activity or heat and inadequate diet. -Advised to call office with any recurrent seizure activity or episodes  2. Hx of R BG ICH -03/2009 -Residual left spastic hemiparesis -stable -Continue to follow with PCP regularly for continued secondary stroke prevention management   Follow-up in 6 months or call earlier if needed    I spent 20 minutes of face-to-face and non-face-to-face time with patient and wife.  This included previsit chart review,  lab review, study review, order entry, electronic health record documentation, patient education regarding seizure activity and ongoing use of AED, history of ICH with residual deficits and answered all questions to patients and wifes satisfaction   Frann Rider, AGNP-BC  [facility] [address] [place], [place] [address]  Phone [phone] Fax [phone] Note: This document was prepared with digital dictation and possible smart phrase technology. Any transcriptional errors that result from this process are unintentional.

## 2019-07-25 NOTE — Patient Instructions (Addendum)
Your Plan:  Continue Keppra 500 mg twice daily for seizure prevention -refill provided   Follow-up in 6 months or call earlier if needed     Thank you for coming to see [place] at [facility]. I hope we have been able to provide you high quality care today.  You may receive a patient satisfaction survey over the next few weeks. We would appreciate your feedback and comments so that we may continue to improve ourselves and the health of our patients.   Seizure, Adult A seizure is a sudden burst of abnormal electrical activity in the brain. Seizures usually last from 30 seconds to 2 minutes. The abnormal activity temporarily interrupts normal brain function. A seizure can cause many different symptoms depending on where in the brain it starts. What are the causes? Common causes of this condition include:  Fever or infection.  Brain abnormality, injury, bleeding, or tumor.  Low blood sugar.  Metabolic disorders or other conditions that are passed from parent to child (are inherited).  Reaction to a substance, such as a drug or a medicine, or suddenly stopping the use of a substance (withdrawal).  Stroke.  Developmental disorders such as autism or cerebral palsy. In some cases, the cause of this condition may not be known. Some people who have a seizure never have another one. Seizures usually do not cause brain damage or permanent problems unless they are prolonged. A person who has repeated seizures over time without a clear cause has a condition called epilepsy. What increases the risk? You are more likely to develop this condition if you have:  A family history of epilepsy.  Had a tonic-clonic seizure in the past. This is a type of seizure that involves whole-body contraction of muscles and a loss of consciousness.  Autism, cerebral palsy, or other brain disorders.  A history of head trauma, lack of oxygen at birth, or strokes. What are the signs or  symptoms? There are many different types of seizures. The symptoms of a seizure vary depending on the type of seizure you have. Examples of symptoms during a seizure include:  Uncontrollable shaking (convulsions).  Stiffening of the body.  Loss of consciousness.  Head nodding.  Staring.  Not responding to sound or touch.  Loss of bladder or bowel control. Some people have symptoms right before a seizure happens (aura) and right after a seizure happens (postictal). Symptoms before a seizure may include:  Fear or anxiety.  Nausea.  Feeling like the room is spinning (vertigo).  A feeling of having seen or heard something before (dj vu).  Odd tastes or smells.  Changes in vision, such as seeing flashing lights or spots. Symptoms after a seizure may include:  Confusion.  Sleepiness.  Headache.  Weakness on one side of the body. How is this diagnosed? This condition may be diagnosed based on:  A description of your symptoms. Video of your seizures can be helpful.  Your medical history.  A physical exam. You may also have tests, including:  Blood tests.  CT scan.  MRI.  Electroencephalogram (EEG). This test measures electrical activity in the brain. An EEG can predict whether seizures will return (recur).  A spinal tap (also called a lumbar puncture). This is the removal and testing of fluid that surrounds the brain and spinal cord. How is this treated? Most seizures will stop on their own in under 5 minutes, and no treatment is needed. Seizures that last longer than 5 minutes will usually need  treatment. Treatment can include:  Medicines given through an IV.  Avoiding known triggers, such as medicines that you take for another condition.  Medicines to treat epilepsy (antiepileptics), if epilepsy caused your seizures.  Surgery to stop seizures, if you have epilepsy that does not respond to medicines. Follow these instructions at home: Medicines  Take  over-the-counter and prescription medicines only as told by your health care provider.  Avoid any substances that may prevent your medicine from working properly, such as alcohol. Activity  Do not drive, swim, or do any other activities that would be dangerous if you had another seizure. Wait until your health care provider says it is safe to do them.  If you live in the [place] check with your local DMV (department of motor vehicles) to find out about local driving laws. Each state has specific rules about when you can legally return to driving.  Get enough rest. Lack of sleep can make seizures more likely to occur. Educating others Teach friends and family what to do if you have a seizure. They should:  Lay you on the ground to prevent a fall.  Cushion your head and body.  Loosen any tight clothing around your neck.  Turn you on your side. If vomiting occurs, this helps keep your airway clear.  Not hold you down. Holding you down will not stop the seizure.  Not put anything into your mouth.  Know whether or not you need emergency care. For example, they should get help right away if you have a seizure that lasts longer than 5 minutes or have several seizures in a row.  Stay with you until you recover.  General instructions  Contact your health care provider each time you have a seizure.  Avoid anything that has ever triggered a seizure for you.  Keep a seizure diary. Record what you remember about each seizure, especially anything that might have triggered the seizure.  Keep all follow-up visits as told by your health care provider. This is important. Contact a health care provider if:  You have another seizure.  You have seizures more often.  Your seizure symptoms change.  You continue to have seizures with treatment.  You have symptoms of an infection or illness. This might increase your risk of having a seizure. Get help right away if:  You have a seizure  that: ? Lasts longer than 5 minutes. ? Is different than previous seizures. ? Leaves you unable to speak or use a part of your body. ? Makes it harder to breathe.  You have: ? A seizure after a head injury. ? Multiple seizures in a row. ? Confusion or a severe headache right after a seizure.  You do not wake up immediately after a seizure.  You injure yourself during a seizure. These symptoms may represent a serious problem that is an emergency. Do not wait to see if the symptoms will go away. Get medical help right away. Call your local emergency services (911 in the [place] Do not drive yourself to the hospital. Summary  Seizures are caused by abnormal electrical activity in the brain. The activity disrupts normal brain function and can cause various symptoms, such as convulsions, abnormal movements, or a change in consciousness.  There are many causes of seizures, including illnesses, medicines, genetic conditions, head injuries, strokes, tumors, substance abuse, or substance withdrawal.  Most seizures will stop on their own in under 5 minutes. Seizures that last longer than 5 minutes are a medical emergency  and require immediate treatment.  Many medicines are used to treat seizures. Take over-the-counter and prescription medicines only as told by your health care provider. This information is not intended to replace advice given to you by your health care provider. Make sure you discuss any questions you have with your health care provider. Document Revised: 03/09/2018 Document Reviewed: 03/09/2018 Elsevier Patient Education  [place].

## 2019-07-28 ENCOUNTER — Other Ambulatory Visit: Payer: Self-pay | Admitting: Cardiology

## 2019-07-28 ENCOUNTER — Other Ambulatory Visit: Payer: Self-pay | Admitting: Family Medicine

## 2019-07-28 DIAGNOSIS — I10 Essential (primary) hypertension: Secondary | ICD-10-CM

## 2019-07-31 NOTE — Telephone Encounter (Signed)
I do not feel comfortable prescribing 270 tablets of this medication  If he is completely out I can send a few to get him back till his PCP returns, otherwise this can be deferred till Loletha Carrow is back in the office

## 2019-07-31 NOTE — Telephone Encounter (Signed)
Rx(s) sent to pharmacy electronically.  

## 2019-08-17 ENCOUNTER — Other Ambulatory Visit: Payer: Self-pay | Admitting: Neurology

## 2019-09-17 ENCOUNTER — Encounter: Payer: Self-pay | Admitting: Family Medicine

## 2019-09-17 MED ORDER — BACLOFEN 10 MG PO TABS: 10.0000 mg | ORAL_TABLET | Freq: Three times a day (TID) | ORAL | 0 refills | Status: DC

## 2019-10-11 ENCOUNTER — Other Ambulatory Visit: Payer: Self-pay | Admitting: Family Medicine

## 2019-10-11 DIAGNOSIS — I10 Essential (primary) hypertension: Secondary | ICD-10-CM

## 2019-10-14 ENCOUNTER — Encounter: Payer: Self-pay | Admitting: Family Medicine

## 2020-01-09 ENCOUNTER — Encounter: Payer: Self-pay | Admitting: Family Medicine

## 2020-01-10 MED ORDER — BACLOFEN 10 MG PO TABS: 10.0000 mg | ORAL_TABLET | Freq: Three times a day (TID) | ORAL | 0 refills | Status: DC

## 2020-01-27 NOTE — Progress Notes (Signed)
[facility] [address] [place] [address] [phone]       OFFICE FOLLOW UP NOTE  Mr. Chance Munter Date of Birth:  19-Jun-1955 Medical Record Number:  [id number]   [facility] provider: Dr. Leonie Man Referring MD: Octaviano Glow Reason for Referral: Seizure    CC: Seizure   HPI:   Today, 01/28/2020, Mr. Rybacki returns for 37-month seizure follow-up accompanied by his wife.  Doing well since prior visit without any additional seizures and remains on Keppra 500 mg twice daily without side effects.  Post stroke left spastic hemiparesis stable without worsening and denies new stroke/TIA symptoms.  Continues to ambulate with a cane and denies any recent falls.  He exercises routinely and goes to the gym frequently.  No concerns at this time.   History provided for reference purposes only Update 07/25/2019 JM: Mr. Makar returns for seizure follow-up accompanied by his wife [name].  He has been stable since prior visit without recurrent seizure activity or episodes.  Remains on Keppra 500 mg twice daily tolerating well.  Repeat EEG 05/20/2019 showed focal right hemispheric slowing likely related to sequela of prior stroke without definite seizure activity noted.  Remote history of right BG ICH in 2011 with residual spastic left hemiparesis which has been stable. Denies new stroke/TIA symptoms.  Blood pressure today 127/76.  No concerns at this time.  Initial visit 04/24/2019 Dr. Leonie Man: Mr. Hollister is a 65 year old Caucasian male seen today for initial office consultation visit for seizure.  He is accompanied by his wife.  History is obtained from them, review of ER electronic medical records and I personally reviewed available imaging films in PACS.  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  He had a witnessed episode of seizure on 04/20/2019.  Patient was sitting  on a computer listening to music concert when his wife was in the next room heard a loud sound and when she arrived she found him lying on the floor leaning to 1 side and having jerking movements.  His eyes were partially open but he had a glazed look and was unresponsive.  He had a tongue bite and he also had urinary incontinence.  She called EMS patient remained unresponsive while they arrived and did not regain consciousness till he reached the hospital.  He subsequently gradually returned back to his baseline. MRI scan of the brain was obtained on 04/20/2019 which showed no acute abnormality showed large area of encephalomalacia in the right basal ganglia extending up into the corona radiata with gliosis.  MRI of the brain showed no large vessel stenosis or occlusion.  Urine analysis was negative.  Basic metabolic panel labs unremarkable.  CBC and creatinine kinase were also normal.  Urine drug screen was negative.  Patient was given IV Keppra and discharged home with Keppra 500 mg twice daily.  Patient states he is taken it for last 3 days he had a episode of emotional lability and cried yesterday but feels fine now.  He denies dizziness or other side effects.  I do not have access to the patient's previous admission in 2011 for intracerebral hemorrhage with apparently I saw the patient at that time.  The wife feels that he may have had a seizure at that time but was not discharged on any anticonvulsants.  He has no other history of hyper of head injury with loss of consciousness or seizures.  His  blood pressure has apparently been well controlled and he takes labetalol , Cozaar and Aldactone.  Today blood pressure is acceptable at 136/79 .  Patient denies any office triggers for his seizures in the form of fever, infection, dehydration or sleep deprivation or alcohol use.  He has significant residual spastic left hemiplegia from his address of the hemorrhage in 2011 and is able to walk with a hemiwalker but  does not have much use of his left arm.  He has a left foot drop.  His wife still keeps a close watch on him but is able to do most activities for himself.  ROS:   14 system review of systems is positive for those listed in HPI and all other systems negative  PMH:  Past Medical History:  Diagnosis Date  . Elevated LDL cholesterol level   . Elevated PSA measurement 11/22/2016  . Hypertension   . Impaired fasting glucose 11/22/2016  . Stroke [facility]) 2011    Social History:  Social History   Socioeconomic History  . Marital status: Married    Spouse name: Not on file  . Number of children: Not on file  . Years of education: Not on file  . Highest education level: Not on file  Occupational History  . Not on file  Tobacco Use  . Smoking status: Never Smoker  . Smokeless tobacco: Never Used  Substance and Sexual Activity  . Alcohol use: Yes    Alcohol/week: 0.0 standard drinks    Comment: 1 glass of wine per week.   . Drug use: No  . Sexual activity: Not on file  Other Topics Concern  . Not on file  Social History Narrative  . Not on file   Social Determinants of Health   Financial Resource Strain: Not on file  Food Insecurity: Not on file  Transportation Needs: Not on file  Physical Activity: Not on file  Stress: Not on file  Social Connections: Not on file  Intimate Partner Violence: Not on file    Medications:   Current Outpatient Medications on File Prior to Visit  Medication Sig Dispense Refill  . atorvastatin (LIPITOR) 10 MG tablet TAKE 1 TABLET BY MOUTH  DAILY 90 tablet 3  . baclofen (LIORESAL) 10 MG tablet Take 1 tablet (10 mg total) by mouth 3 (three) times daily. 270 tablet 0  . diltiazem (CARDIZEM CD) 240 MG 24 hr capsule Take 1 capsule (240 mg total) by mouth daily. 90 capsule 2  . labetalol (NORMODYNE) 100 MG tablet TAKE 1 TABLET BY MOUTH  TWICE DAILY 180 tablet 3  . labetalol (NORMODYNE) 200 MG tablet TAKE 1 TABLET BY MOUTH  TWICE DAILY 180 tablet 3  .  losartan (COZAAR) 100 MG tablet TAKE 1 TABLET BY MOUTH  DAILY 90 tablet 1  . Multiple Vitamins-Minerals (CENTRUM SILVER 50+MEN) TABS Take 1 tablet by mouth daily with lunch.    . potassium chloride SA (KLOR-CON M20) 20 MEQ tablet Take 2 tablets (40 mEq total) by mouth daily. (Patient taking differently: Take 40 mEq by mouth daily with lunch.) 180 tablet 3  . spironolactone (ALDACTONE) 25 MG tablet Take 1 tablet (25 mg total) by mouth daily. 90 tablet 2   No current facility-administered medications on file prior to visit.    Allergies:   Allergies  Allergen Reactions  . Phenytoin Sodium Extended Swelling and Other (See Comments)    Dilantin- Arm swelling   . Penicillins Rash    Has patient had a PCN reaction  causing immediate rash, facial/tongue/throat swelling, SOB or lightheadedness with hypotension: Unknown Has patient had a PCN reaction causing severe rash involving mucus membranes or skin necrosis: Unk Has patient had a PCN reaction that required hospitalization: Unk Has patient had a PCN reaction occurring within the last 10 years: Yes If all of the above answers are "NO", then may proceed with Cephalosporin use.      Today's Vitals   01/28/20 1240  BP: 125/71  Pulse: 61  Weight: 137 lb 12.8 oz (62.5 kg)  Height: 6' (1.829 m)   Body mass index is 18.69 kg/m.   Physical Exam General: well developed, well nourished very pleasant middle-aged Caucasian male, seated, in no evident distress Head: head normocephalic and atraumatic.   Neck: supple with no carotid or supraclavicular bruits Cardiovascular: regular rate and rhythm, no murmurs Musculoskeletal: no deformity except left foot drop Skin:  no rash/petichiae Vascular:  Normal pulses all extremities  Neurologic Exam Mental Status: Awake and fully alert. Oriented to place and time. Recent and remote memory intact. Attention span, concentration and fund of knowledge appropriate. Mood and affect appropriate.  Cranial  Nerves: Pupils equal, briskly reactive to light. Extraocular movements full without nystagmus but saccadic dysmetria on right lateral gaze.. Visual fields full to confrontation. Hearing intact. Facial sensation intact.  Mild left lower facial asymmetry when he smiles only.  Tongue, palate moves normally and symmetrically.  Motor: Spastic left hemiparesis with left upper extremity 1/5 proximally with no movement of hand.  Left lower extremity drift with 4/5 strength proximally at the hip and knees with left ankle foot drop with ankle dorsiflexors 0/5 and plantar flexors 2/5.  Spasticity with increased tone on the left side.  L sustained left ankle clonus.  Normal strength on the right side. Sensory.:  Diminished left hemibody touch , pinprick , position and vibratory sensation.  Coordination: Rapid alternating movements normal on right side. Finger-to-nose and heel-to-shin performed accurately on right side. Gait and Station: Stands from seated position without difficulty.  Hemiplegic gait with use of cane and occasional unsteadiness.  Tandem walk and heel toe not attempted. Reflexes: 2+ and asymmetric and brisker on the left. Toes downgoing.      ASSESSMENT/PLAN: 65 year old Caucasian male with solitary episode of weakness partial onset seizures with secondary generalization on 04/19/2019 likely symptomatic from remote hypertensive right basal ganglia intracerebral hemorrhage with late effect.   1.  Seizure, late effect of stroke -Denies recurrent seizure activity or episode -Continue Keppra 500 mg twice daily for seizure prophylaxis -refill provided -Educated on avoidance of seizure provoking factors like sleep deprivation, medication noncompliance, stimulants and extremes of physical activity or heat and inadequate diet. -Advised to call office with any recurrent seizure activity or episodes  2. Hx of R BG ICH -03/2009 -Residual left spastic hemiparesis -stable -Continue to follow with PCP  regularly for continued secondary stroke prevention management   Follow-up in 1 year or call earlier if needed    CC:  [facility] provider: Dr. Janus Molder, Vickie L, NP-C    I spent 30 minutes of face-to-face and non-face-to-face time with patient and wife.  This included previsit chart review, lab review, study review, order entry, electronic health record documentation, patient education regarding seizures and ongoing use of AED, history of ICH with residual deficits and importance of managing stroke risk factors and answered all questions to patients and wifes satisfaction   Frann Rider, [facility]  [facility] [address] [place], [place] [address]  Phone  [phone] Fax [phone] Note: This document was prepared with digital dictation and possible smart phrase technology. Any transcriptional errors that result from this process are unintentional.

## 2020-01-28 ENCOUNTER — Ambulatory Visit: Payer: Medicare Other | Admitting: Adult Health

## 2020-01-28 ENCOUNTER — Encounter: Payer: Self-pay | Admitting: Adult Health

## 2020-01-28 ENCOUNTER — Other Ambulatory Visit: Payer: Self-pay

## 2020-01-28 VITALS — BP 125/71 | HR 61 | Ht 72.0 in | Wt 137.8 lb

## 2020-01-28 DIAGNOSIS — G40219 Localization-related (focal) (partial) symptomatic epilepsy and epileptic syndromes with complex partial seizures, intractable, without status epilepticus: Secondary | ICD-10-CM | POA: Diagnosis not present

## 2020-01-28 DIAGNOSIS — I610 Nontraumatic intracerebral hemorrhage in hemisphere, subcortical: Secondary | ICD-10-CM

## 2020-01-28 MED ORDER — LEVETIRACETAM 500 MG PO TABS: 500.0000 mg | ORAL_TABLET | Freq: Two times a day (BID) | ORAL | 3 refills | Status: DC

## 2020-01-28 NOTE — Progress Notes (Signed)
I agree with the above plan 

## 2020-01-28 NOTE — Patient Instructions (Signed)
Continue keppra 500mg  twice daily for seizure prevention  Continue to follow up with PCP regarding cholesterol and blood pressure management  Maintain strict control of hypertension with blood pressure goal below 130/90 and cholesterol with LDL cholesterol (bad cholesterol) goal below 70 mg/dL.       Followup in the future with me in 1 year or call earlier if needed       Thank you for coming to see [place] at [facility]. I hope we have been able to provide you high quality care today.  You may receive a patient satisfaction survey over the next few weeks. We would appreciate your feedback and comments so that we may continue to improve ourselves and the health of our patients.

## 2020-02-11 ENCOUNTER — Other Ambulatory Visit: Payer: Self-pay | Admitting: Cardiology

## 2020-02-11 ENCOUNTER — Other Ambulatory Visit: Payer: Self-pay | Admitting: Family Medicine

## 2020-02-11 DIAGNOSIS — I10 Essential (primary) hypertension: Secondary | ICD-10-CM

## 2020-03-11 ENCOUNTER — Other Ambulatory Visit: Payer: Self-pay | Admitting: Cardiology

## 2020-03-12 ENCOUNTER — Encounter: Payer: Self-pay | Admitting: Family Medicine

## 2020-03-17 ENCOUNTER — Encounter: Payer: Self-pay | Admitting: Medical

## 2020-03-17 ENCOUNTER — Other Ambulatory Visit: Payer: Self-pay

## 2020-03-17 ENCOUNTER — Ambulatory Visit: Payer: Medicare Other | Admitting: Family Medicine

## 2020-03-17 ENCOUNTER — Ambulatory Visit (INDEPENDENT_AMBULATORY_CARE_PROVIDER_SITE_OTHER): Payer: Medicare Other | Admitting: Medical

## 2020-03-17 VITALS — BP 134/80 | HR 52

## 2020-03-17 DIAGNOSIS — H6121 Impacted cerumen, right ear: Secondary | ICD-10-CM | POA: Insufficient documentation

## 2020-03-17 DIAGNOSIS — H9193 Unspecified hearing loss, bilateral: Secondary | ICD-10-CM | POA: Diagnosis not present

## 2020-03-17 DIAGNOSIS — Z8673 Personal history of transient ischemic attack (TIA), and cerebral infarction without residual deficits: Secondary | ICD-10-CM | POA: Insufficient documentation

## 2020-03-17 DIAGNOSIS — R94120 Abnormal auditory function study: Secondary | ICD-10-CM | POA: Insufficient documentation

## 2020-03-17 NOTE — Progress Notes (Signed)
Subjective:  Steve Burton is a 65 y.o. male who presents for Chief Complaint  Patient presents with  . Ear Problem    Clogged left ear       Here for ear concern.  Feels like ears are clogged.  Has had ear flush in the past.   No injury, no illness, no fever, no cough, no congestion, no runny nose.  Has hx/o stroke. otherwise in normal state of health.  No other aggravating or relieving factors.    No other c/o.  Past Medical History:  Diagnosis Date  . Elevated LDL cholesterol level   . Elevated PSA measurement 11/22/2016  . Hypertension   . Impaired fasting glucose 11/22/2016  . Stroke [facility]) 2011    The following portions of the patient's history were reviewed and updated as appropriate: allergies, current medications, past family history, past medical history, past social history, past surgical history and problem list.  ROS Otherwise as in subjective above    Objective: BP 134/80   Pulse (!) 52   SpO2 97%   General appearance: alert, no distress, well developed, well nourished, pleasant white male, seated in wheel chair HEENT: normocephalic, sclerae anicteric, conjunctiva pink and moist, left ear canal patent, and TM pearly, right ear canal with impacted cerumen, nares patent, no discharge or erythema, pharynx normal Oral cavity: MMM, no lesions Neck: supple, no lymphadenopathy, no thyromegaly, no masses   Assessment: Encounter Diagnoses  Name Primary?  . Decreased hearing, bilateral Yes  . Impacted cerumen of right ear   . Abnormal hearing screen   . History of stroke      Plan: Abnormal hearing screen.  Advised referral to audiology/ENT  Discussed findings.  Discussed risk/benefits of procedure and patient agrees to procedure. Successfully used warm water lavage to remove impacted cerumen from right ear canal. Patient tolerated procedure well. Advised they avoid using any cotton swabs or other devices to clean the ear canals.  Use basic hygiene as discussed.   Follow up prn.    [name] was seen today for ear problem.  Diagnoses and all orders for this visit:  Decreased hearing, bilateral -     Ambulatory referral to Audiology  Impacted cerumen of right ear  Abnormal hearing screen -     Ambulatory referral to Audiology  History of stroke    Follow up: pending referral

## 2020-03-25 ENCOUNTER — Telehealth: Payer: Self-pay | Admitting: Cardiology

## 2020-03-25 NOTE — Telephone Encounter (Signed)
3.23.22 LVM on home phone to schedule 1 yr fu w/Dr. Stanford Breed. LP

## 2020-03-27 ENCOUNTER — Other Ambulatory Visit: Payer: Self-pay | Admitting: Family Medicine

## 2020-03-30 NOTE — Telephone Encounter (Signed)
Ok to refill 

## 2020-04-12 ENCOUNTER — Other Ambulatory Visit: Payer: Self-pay | Admitting: Cardiology

## 2020-04-12 ENCOUNTER — Other Ambulatory Visit: Payer: Self-pay | Admitting: Family Medicine

## 2020-06-05 ENCOUNTER — Ambulatory Visit: Payer: Medicare Other | Admitting: Family Medicine

## 2020-06-16 ENCOUNTER — Other Ambulatory Visit: Payer: Self-pay | Admitting: Internal Medicine

## 2020-06-16 ENCOUNTER — Encounter: Payer: Self-pay | Admitting: Internal Medicine

## 2020-06-16 MED ORDER — BACLOFEN 10 MG PO TABS: 10.0000 mg | ORAL_TABLET | Freq: Three times a day (TID) | ORAL | 0 refills | Status: DC

## 2020-06-16 NOTE — Progress Notes (Signed)
Refilled med

## 2020-06-16 NOTE — Progress Notes (Signed)
Refill request for baclofen to [facility]

## 2020-07-08 ENCOUNTER — Other Ambulatory Visit: Payer: Self-pay | Admitting: Family Medicine

## 2020-07-08 DIAGNOSIS — I10 Essential (primary) hypertension: Secondary | ICD-10-CM

## 2020-07-08 NOTE — Progress Notes (Signed)
HPI: FU hypertension and possible HOCM. Seen previously by Dr Domenic Polite for hypertension. Echo 4/11 showed hyperdynamic LV function, moderate LVH, intracavitary gradient of 4 m/s, grade 1 diastolic dysfunction, SAM with peak LVOT gradient of 60 mmHg. Carotid dopplers 4/11 showed no stenosis bilaterally. Cardiac MRI December 2016 showed normal LV function, focal basal septal hypertrophy, no SAM and hypertrophic cardiomyopathy could not be excluded but findings also could be consistent with hypertension. Last echo March 2021 showed normal LV function, moderate left ventricular hypertrophy with severe basal septal hypertrophy, grade 1 diastolic dysfunction, no LVOT gradient at rest, trace aortic insufficiency.  Since last seen, patient denies dyspnea, chest pain, palpitations or syncope.  Current Outpatient Medications  Medication Sig Dispense Refill   atorvastatin (LIPITOR) 10 MG tablet TAKE 1 TABLET BY MOUTH  DAILY 90 tablet 0   baclofen (LIORESAL) 10 MG tablet Take 1 tablet (10 mg total) by mouth 3 (three) times daily. 270 tablet 0   diltiazem (CARDIZEM CD) 240 MG 24 hr capsule TAKE 1 CAPSULE BY MOUTH  DAILY 90 capsule 3   labetalol (NORMODYNE) 100 MG tablet TAKE 1 TABLET BY MOUTH  TWICE DAILY 180 tablet 3   labetalol (NORMODYNE) 200 MG tablet TAKE 1 TABLET BY MOUTH  TWICE DAILY 180 tablet 3   levETIRAcetam (KEPPRA) 500 MG tablet Take 1 tablet (500 mg total) by mouth 2 (two) times daily. 180 tablet 3   losartan (COZAAR) 100 MG tablet TAKE 1 TABLET BY MOUTH  DAILY 90 tablet 0   Multiple Vitamins-Minerals (CENTRUM SILVER 50+MEN) TABS Take 1 tablet by mouth daily with lunch.     potassium chloride SA (KLOR-CON) 20 MEQ tablet TAKE 2 TABLETS BY MOUTH  DAILY 60 tablet 1   spironolactone (ALDACTONE) 25 MG tablet TAKE 1 TABLET BY MOUTH  DAILY 90 tablet 0   No current facility-administered medications for this visit.     Past Medical History:  Diagnosis Date   Elevated LDL cholesterol level     Elevated PSA measurement 11/22/2016   Hypertension    Impaired fasting glucose 11/22/2016   Stroke ([place]) 2011    Past Surgical History:  Procedure Laterality Date   CATARACT EXTRACTION  2006   right eye   HERNIA REPAIR  2005    Social History   Socioeconomic History   Marital status: Married    Spouse name: Not on file   Number of children: Not on file   Years of education: Not on file   Highest education level: Not on file  Occupational History   Not on file  Tobacco Use   Smoking status: Never   Smokeless tobacco: Never  Substance and Sexual Activity   Alcohol use: Yes    Alcohol/week: 0.0 standard drinks    Comment: 1 glass of wine per week.    Drug use: No   Sexual activity: Not on file  Other Topics Concern   Not on file  Social History Narrative   Not on file   Social Determinants of Health   Financial Resource Strain: Not on file  Food Insecurity: Not on file  Transportation Needs: Not on file  Physical Activity: Not on file  Stress: Not on file  Social Connections: Not on file  Intimate Partner Violence: Not on file    Family History  Problem Relation Age of Onset   Stroke Mother    Heart disease Father        MI at age 10   Epilepsy  Sister    Colon cancer Neg Hx     ROS: no fevers or chills, productive cough, hemoptysis, dysphasia, odynophagia, melena, hematochezia, dysuria, hematuria, rash, seizure activity, orthopnea, PND, pedal edema, claudication. Remaining systems are negative.  Physical Exam: Well-developed well-nourished in no acute distress.  Skin is warm and dry.  HEENT is normal.  Neck is supple.  Chest is clear to auscultation with normal expansion.  Cardiovascular exam is regular rate and rhythm.  Abdominal exam nontender or distended. No masses palpated. Extremities show no edema. neuro residual left-sided weakness from previous CVA  ECG-sinus bradycardia at a rate of 55, nonspecific ST changes.  Personally  reviewed  A/P  1 possible hypertrophic cardiomyopathy-previous echocardiogram was suggestive of hypertrophic obstructive cardiomyopathy but follow-up MRI and echocardiogram suggest likely more related to hypertension.  We will plan follow-up echoes in the future.  2 hypertension-patient's blood pressure is controlled.  Continue present medical regimen and follow.  Check potassium and renal function.  3 hyperlipidemia-continue statin.  Check lipids and liver.  Kirk Ruths, MD

## 2020-07-13 ENCOUNTER — Ambulatory Visit: Payer: Medicare Other | Admitting: Cardiology

## 2020-07-13 ENCOUNTER — Encounter: Payer: Self-pay | Admitting: Cardiology

## 2020-07-13 ENCOUNTER — Other Ambulatory Visit: Payer: Self-pay

## 2020-07-13 VITALS — BP 130/76 | HR 55 | Ht 72.0 in | Wt 170.6 lb

## 2020-07-13 DIAGNOSIS — I10 Essential (primary) hypertension: Secondary | ICD-10-CM | POA: Diagnosis not present

## 2020-07-13 DIAGNOSIS — E7800 Pure hypercholesterolemia, unspecified: Secondary | ICD-10-CM | POA: Diagnosis not present

## 2020-07-13 NOTE — Patient Instructions (Signed)
  Follow-Up: At [facility], you and your health needs are our priority.  As part of our continuing mission to provide you with exceptional heart care, we have created designated Provider Care Teams.  These Care Teams include your primary Cardiologist (physician) and Advanced Practice Providers (APPs -  Physician Assistants and Nurse Practitioners) who all work together to provide you with the care you need, when you need it.  We recommend signing up for the patient portal called "MyChart".  Sign up information is provided on this After Visit Summary.  MyChart is used to connect with patients for Virtual Visits (Telemedicine).  Patients are able to view lab/test results, encounter notes, upcoming appointments, etc.  Non-urgent messages can be sent to your provider as well.   To learn more about what you can do with MyChart, go to [email or web address].    Your next appointment:   12 month(s)  The format for your next appointment:   In Person  Provider:   Nicolaus Crenshaw, MD    

## 2020-07-16 ENCOUNTER — Ambulatory Visit: Payer: Medicare Other | Admitting: Family Medicine

## 2020-07-21 ENCOUNTER — Ambulatory Visit (INDEPENDENT_AMBULATORY_CARE_PROVIDER_SITE_OTHER): Payer: Medicare Other | Admitting: Medical

## 2020-07-21 ENCOUNTER — Encounter: Payer: Self-pay | Admitting: Medical

## 2020-07-21 ENCOUNTER — Other Ambulatory Visit: Payer: Self-pay

## 2020-07-21 VITALS — BP 124/78 | HR 57 | Wt 163.4 lb

## 2020-07-21 DIAGNOSIS — I69398 Other sequelae of cerebral infarction: Secondary | ICD-10-CM | POA: Diagnosis not present

## 2020-07-21 DIAGNOSIS — G8114 Spastic hemiplegia affecting left nondominant side: Secondary | ICD-10-CM

## 2020-07-21 DIAGNOSIS — E785 Hyperlipidemia, unspecified: Secondary | ICD-10-CM | POA: Diagnosis not present

## 2020-07-21 DIAGNOSIS — R252 Cramp and spasm: Secondary | ICD-10-CM

## 2020-07-21 DIAGNOSIS — I10 Essential (primary) hypertension: Secondary | ICD-10-CM | POA: Diagnosis not present

## 2020-07-21 DIAGNOSIS — E7800 Pure hypercholesterolemia, unspecified: Secondary | ICD-10-CM | POA: Diagnosis not present

## 2020-07-21 DIAGNOSIS — Z8673 Personal history of transient ischemic attack (TIA), and cerebral infarction without residual deficits: Secondary | ICD-10-CM

## 2020-07-21 DIAGNOSIS — S91209A Unspecified open wound of unspecified toe(s) with damage to nail, initial encounter: Secondary | ICD-10-CM | POA: Insufficient documentation

## 2020-07-21 DIAGNOSIS — S99922A Unspecified injury of left foot, initial encounter: Secondary | ICD-10-CM | POA: Diagnosis not present

## 2020-07-21 NOTE — Patient Instructions (Addendum)
[facility]: [place]  [phone]

## 2020-07-21 NOTE — Progress Notes (Signed)
Subjective:  Steve Burton is a 65 y.o. male who presents for Chief Complaint  Patient presents with   needs referral    Needs referral to hand center for arm splint. Needs disability form filled out     Here with his wife today.  Medical team: Dr. Kirk Ruths, cardiology Frann Rider, nurse practitioner neurology Dr. Owens Loffler, GI Girtha Rm, NP-C here for primary care (although she is on medical leave currently)  He is seated in a wheelchair.  He is here for several concerns.  He has a history of stroke back in 2011.  He has had left arm and leg paresis since the stroke.  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  After the stroke he worked for period of time until they felt he was no longer able to do the work.  [name] he has been out of work since roughly 2011/2012.  He has permanent weakness in the left arm and legs.  She notes that he is unable to do work since the stroke.  He has to have help with activities of daily living.  He uses a wheelchair.  He needs disability forms updated.  He has had to do these forms every 12 to 15 months  Due to the left hand spasticity particular at night and left arm paresis, he uses an arm splint.  The splint has become defective and is worn out.  He needs a prescription to have an updated splint  He saw cardiology recently.  They wanted him to do fasting labs.  He wanted to come here for labs.  He is here today for labs per cardiology  He stopped his left great toe in the bathtub recently.  He had bruising and purple discoloration of the toenail.  The toenail fell off.  This happened about 2-3 weeks ago.  Now he has questions about whether the toenail will grow back and about the small area of purplish coloration still left.   He has no feeling or strength of the foot or left leg  No other new complaints  Past Medical History:  Diagnosis Date   Elevated LDL cholesterol level    Elevated PSA  measurement 11/22/2016   Hyperlipidemia    Hypertension    Impaired fasting glucose 11/22/2016   Left hemiparesis (HCC)    Spastic hemiparesis ([place])    Stroke ([place]) 2011   Current Outpatient Medications on File Prior to Visit  Medication Sig Dispense Refill   atorvastatin (LIPITOR) 10 MG tablet TAKE 1 TABLET BY MOUTH  DAILY 90 tablet 0   baclofen (LIORESAL) 10 MG tablet Take 1 tablet (10 mg total) by mouth 3 (three) times daily. 270 tablet 0   diltiazem (CARDIZEM CD) 240 MG 24 hr capsule TAKE 1 CAPSULE BY MOUTH  DAILY 90 capsule 3   labetalol (NORMODYNE) 100 MG tablet TAKE 1 TABLET BY MOUTH  TWICE DAILY 180 tablet 3   labetalol (NORMODYNE) 200 MG tablet TAKE 1 TABLET BY MOUTH  TWICE DAILY 180 tablet 3   levETIRAcetam (KEPPRA) 500 MG tablet Take 1 tablet (500 mg total) by mouth 2 (two) times daily. 180 tablet 3   losartan (COZAAR) 100 MG tablet TAKE 1 TABLET BY MOUTH  DAILY 90 tablet 0   Multiple Vitamins-Minerals (CENTRUM SILVER 50+MEN) TABS Take 1 tablet by mouth daily with lunch.     potassium chloride SA (KLOR-CON) 20 MEQ tablet TAKE 2 TABLETS BY MOUTH  DAILY  60 tablet 1   spironolactone (ALDACTONE) 25 MG tablet TAKE 1 TABLET BY MOUTH  DAILY 90 tablet 0   No current facility-administered medications on file prior to visit.     The following portions of the patient's history were reviewed and updated as appropriate: allergies, current medications, past family history, past medical history, past social history, past surgical history and problem list.  ROS Otherwise as in subjective above  Objective: BP 124/78   Pulse (!) 57   Wt 163 lb 6.4 oz (74.1 kg)   BMI 22.16 kg/m   General appearance: alert, no distress, well developed, well nourished, white male Seated in wheelchair Left arm with spasticity of hand in a flexed position, spasticity of the left forearm and upper arm, no organized intentional motion with the left arm or leg. Left great toe with nail missing, nailbed has  some thickened tissue with a small area in the corner of the toenail nailbed area with purple discoloration likely from recent hematoma No sensation of the left arm or leg Pulses: 2+ radial pulses, 2+ pedal pulses, normal cap refill Ext: no edema   Assessment: Encounter Diagnoses  Name Primary?   Left spastic hemiparesis (HCC) Yes   History of stroke    Spasticity as late effect of cerebrovascular accident (CVA)    Essential hypertension    Hyperlipidemia, unspecified hyperlipidemia type    Injury of toenail of left foot, initial encounter    Avulsion of toenail, initial encounter      Plan: History of stroke, left spastic hemiparesis -I will review his forms and work on completing him for disability  Left arm hemiparesis and spasticity-I wrote a new prescription for splint that he can take to [facility].  He needs this equipment to reduce the risk of complication of future injury and to overall improve care for his limb  Hypertension-continue current medication.  I reviewed his recent cardiology notes from July 13, 2020 visit  Hyperlipidemia, history of stroke-continue statin, labs today per cardiology request  Injury of toenail, avulsion nail-I suspect he had a recent hematoma that is mostly healing.  He did lose the nail.  We discussed that it may or may not come back.  Currently there is no signs of trauma no swelling no obvious redness or signs of infection.  Continue to monitor  I reviewed his recent cardiology notes from  [name] was seen today for needs referral.  Diagnoses and all orders for this visit:  Left spastic hemiparesis ([employer])  History of stroke  Spasticity as late effect of cerebrovascular accident (CVA)  Essential hypertension  Hyperlipidemia, unspecified hyperlipidemia type  Injury of toenail of left foot, initial encounter  Avulsion of toenail, initial encounter   Follow up: pending call back

## 2020-07-22 ENCOUNTER — Telehealth: Payer: Self-pay | Admitting: Medical

## 2020-07-22 LAB — COMPREHENSIVE METABOLIC PANEL
ALT: 13 IU/L (ref 0–44)
AST: 11 IU/L (ref 0–40)
Albumin/Globulin Ratio: 2.1 (ref 1.2–2.2)
Albumin: 4.5 g/dL (ref 3.8–4.8)
Alkaline Phosphatase: 105 IU/L (ref 44–121)
BUN/Creatinine Ratio: 17 (ref 10–24)
BUN: 18 mg/dL (ref 8–27)
Bilirubin Total: 0.7 mg/dL (ref 0.0–1.2)
CO2: 26 mmol/L (ref 20–29)
Calcium: 9.9 mg/dL (ref 8.6–10.2)
Chloride: 106 mmol/L (ref 96–106)
Creatinine, Ser: 1.08 mg/dL (ref 0.76–1.27)
Globulin, Total: 2.1 g/dL (ref 1.5–4.5)
Glucose: 100 mg/dL — ABNORMAL HIGH (ref 65–99)
Potassium: 4.6 mmol/L (ref 3.5–5.2)
Sodium: 144 mmol/L (ref 134–144)
Total Protein: 6.6 g/dL (ref 6.0–8.5)
eGFR: 77 mL/min/{1.73_m2} (ref >59)

## 2020-07-22 LAB — LIPID PANEL
Chol/HDL Ratio: 2.8 ratio (ref 0.0–5.0)
Cholesterol, Total: 159 mg/dL (ref 100–199)
HDL: 56 mg/dL (ref >39)
LDL Chol Calc (NIH): 90 mg/dL (ref 0–99)
Triglycerides: 64 mg/dL (ref 0–149)
VLDL Cholesterol Cal: 13 mg/dL (ref 5–40)

## 2020-07-22 NOTE — Telephone Encounter (Signed)
disability paperwork has been completed.  Please SCAN copy for [place].  Return form to patient or employer as requested.

## 2020-07-24 ENCOUNTER — Encounter: Payer: Self-pay | Admitting: *Deleted

## 2020-07-27 DIAGNOSIS — Z8673 Personal history of transient ischemic attack (TIA), and cerebral infarction without residual deficits: Secondary | ICD-10-CM | POA: Diagnosis not present

## 2020-08-06 ENCOUNTER — Other Ambulatory Visit: Payer: Self-pay | Admitting: Family Medicine

## 2020-08-25 IMAGING — MR MR MRA HEAD W/O CM
1 series · 19 of 48 positions shown · non-contrast
Comparison: Head CT 05/13/2009

CLINICAL DATA: Stroke follow-up

EXAM:
MRI HEAD WITHOUT CONTRAST
MRA HEAD WITHOUT CONTRAST
TECHNIQUE: Multiplanar, multiecho pulse sequences of the brain and surrounding
structures were obtained without intravenous contrast. Angiographic
images of the head were obtained using MRA technique without
contrast.

[Series 7: 3d cow · axial · 0.5mm · 0.41mm/px · z∈[-47,+50]mm · 19 of 204 slices shown]
[im 1/204]
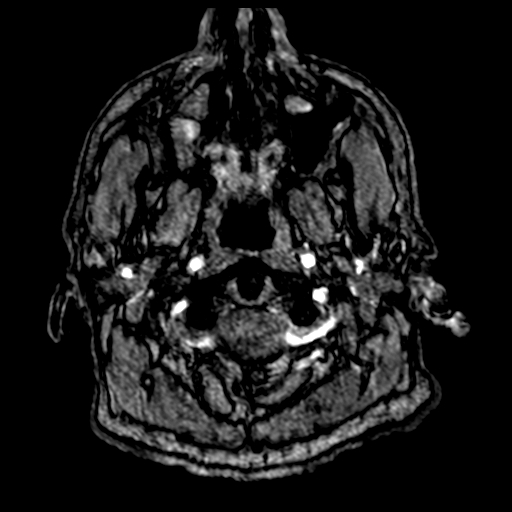
[im 5/204]
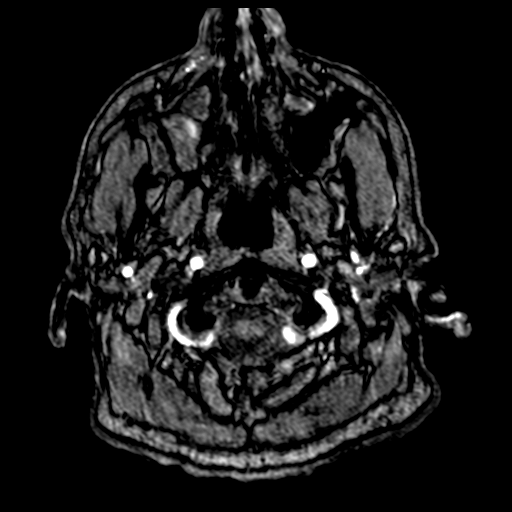
[im 9/204]
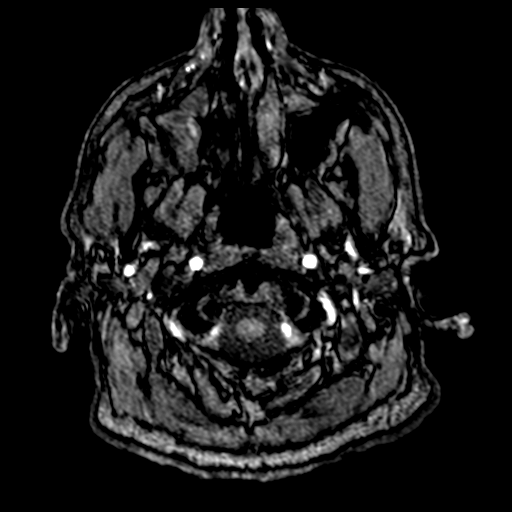
[im 13/204]
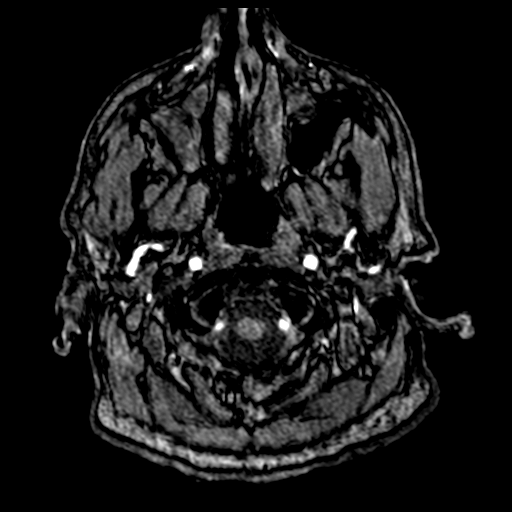
[im 18/204]
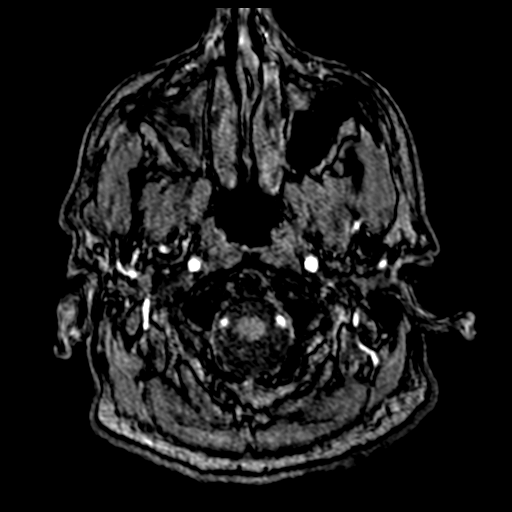
[im 22/204]
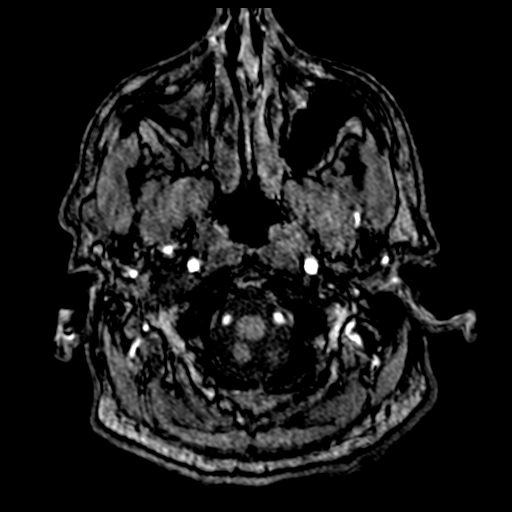
[im 26/204]
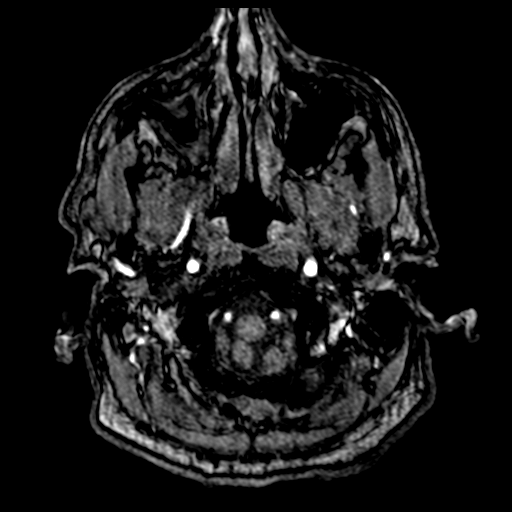
[im 31/204]
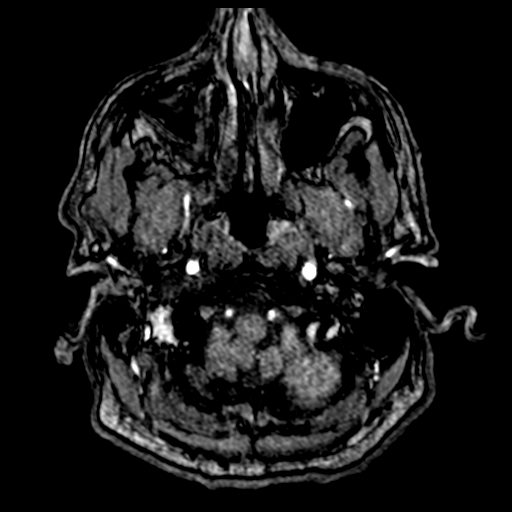
[im 35/204]
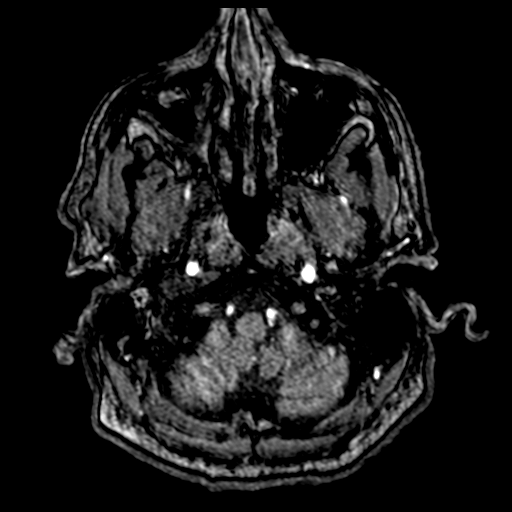
[im 39/204]
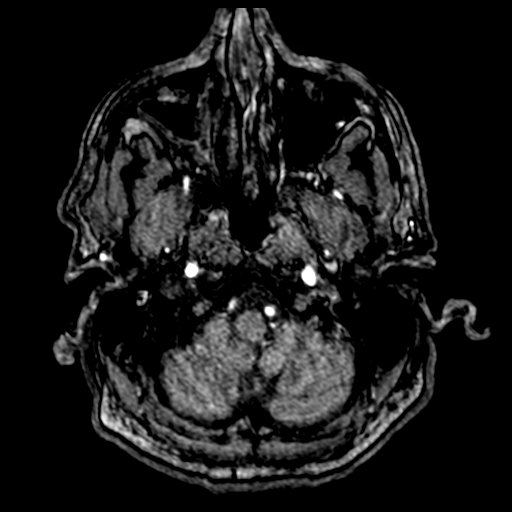
[im 44/204]
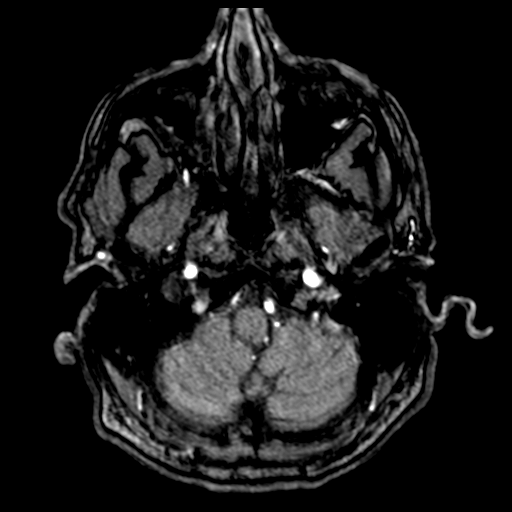
[im 65/204]
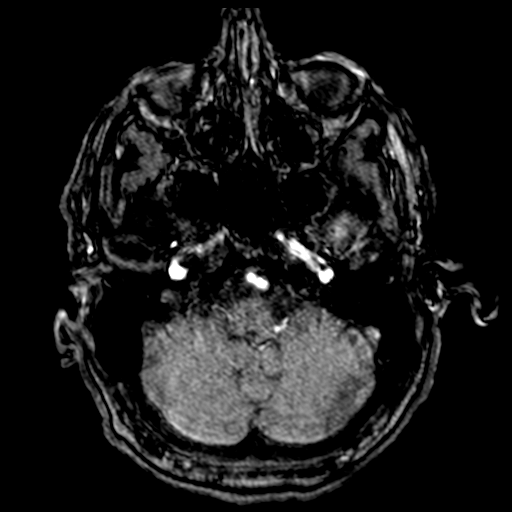
[im 91/204]
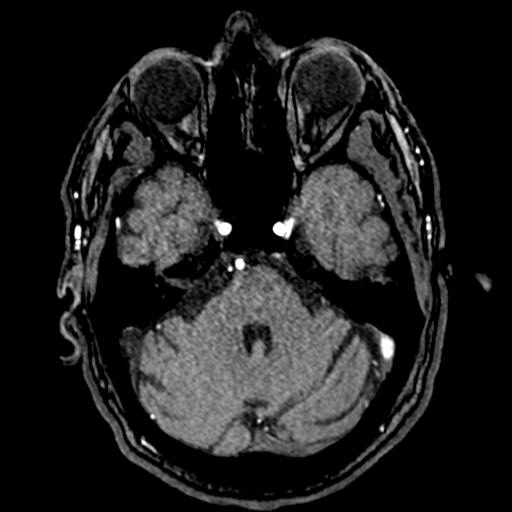
[im 104/204]
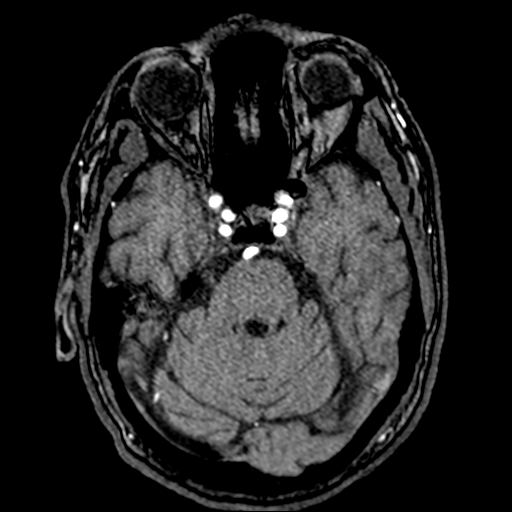
[im 117/204]
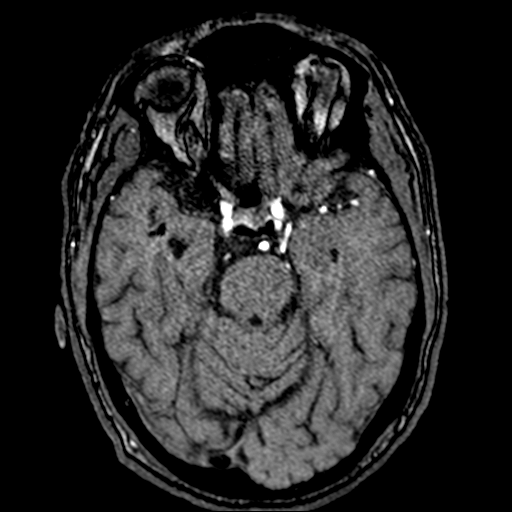
[im 143/204]
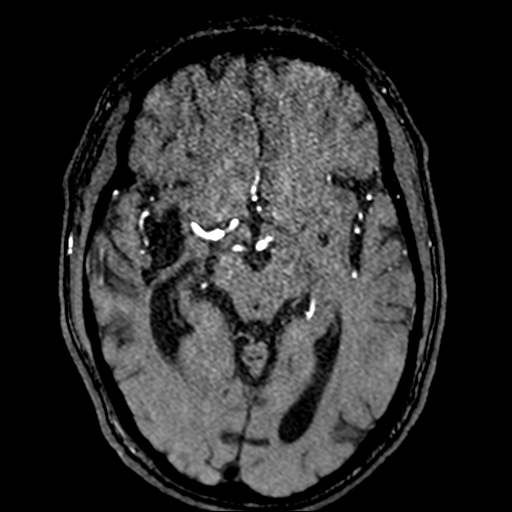
[im 169/204]
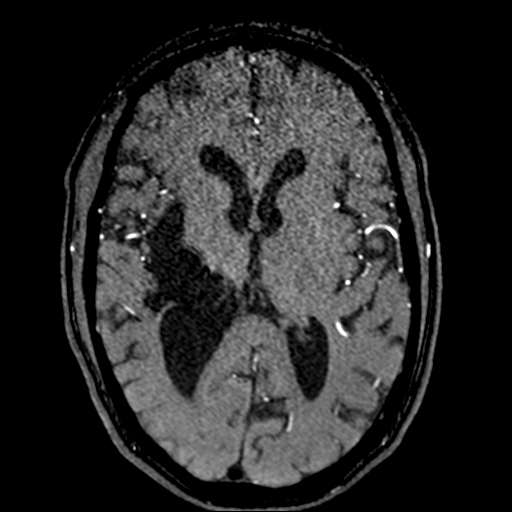
[im 173/204]
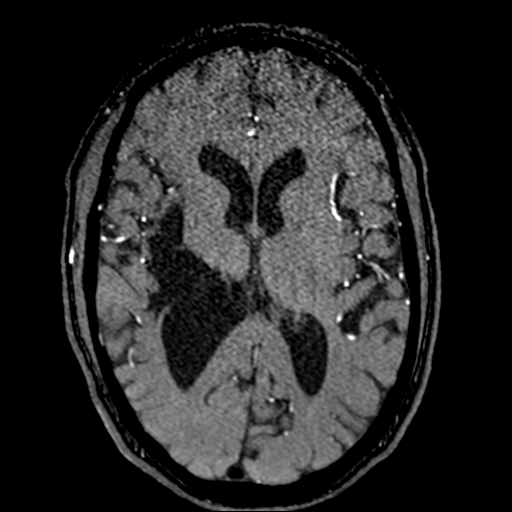
[im 195/204]
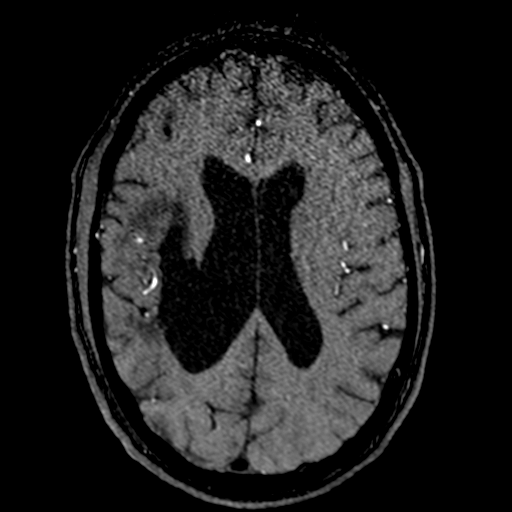

[19 of 48 positions shown; findings below may reference images not displayed]

FINDINGS: MRI HEAD FINDINGS

Brain: No acute infarct, acute hemorrhage or extra-axial collection.
Large area of encephalomalacia in the right hemisphere at the site
of remote intraparenchymal hematoma. Normal volume of CSF spaces.
Focus of chronic microhemorrhage in the posterior left hemisphere.
Normal midline structures.

Vascular: Normal flow voids.

Skull and upper cervical spine: Normal marrow signal.

Sinuses/Orbits: Moderate right maxillary sinus mucosal thickening.

Other: None.

MRA HEAD FINDINGS

POSTERIOR CIRCULATION:

--Vertebral arteries: Normal V4 segments.

--Posterior inferior cerebellar arteries (PICA): Patent origins from
the vertebral arteries.

--Anterior inferior cerebellar arteries (AICA): Patent origins from
the basilar artery.

--Basilar artery: Normal.

--Superior cerebellar arteries: Normal.

--Posterior cerebral arteries: Normal. The left PCA is predominantly
supplied by the posterior communicating artery.

ANTERIOR CIRCULATION:

--Intracranial internal carotid arteries: Normal.

--Anterior cerebral arteries (ACA): Normal. Both A1 segments are
present. Patent anterior communicating artery (a-comm).

--Middle cerebral arteries (MCA): Normal.
IMPRESSION: 1. No acute intracranial abnormality.
2. Normal intracranial MRA.
3. Large area of encephalomalacia at the site of remote right
hemisphere intraparenchymal hematoma.

## 2020-09-09 ENCOUNTER — Telehealth (INDEPENDENT_AMBULATORY_CARE_PROVIDER_SITE_OTHER): Payer: Self-pay

## 2020-09-09 NOTE — Telephone Encounter (Signed)
Attempted to reach patient to schedule AWV over the phone. If patient calls back please schedule on nurse schedule for any weekend in September. Nat Christen, CMA

## 2020-09-25 ENCOUNTER — Encounter: Payer: Self-pay | Admitting: Family Medicine

## 2020-09-25 ENCOUNTER — Ambulatory Visit (INDEPENDENT_AMBULATORY_CARE_PROVIDER_SITE_OTHER): Payer: Medicare Other | Admitting: Family Medicine

## 2020-09-25 ENCOUNTER — Other Ambulatory Visit: Payer: Self-pay

## 2020-09-25 VITALS — BP 122/70 | HR 59 | Temp 97.0°F | Ht 71.0 in | Wt 165.8 lb

## 2020-09-25 DIAGNOSIS — Z8673 Personal history of transient ischemic attack (TIA), and cerebral infarction without residual deficits: Secondary | ICD-10-CM

## 2020-09-25 DIAGNOSIS — G8114 Spastic hemiplegia affecting left nondominant side: Secondary | ICD-10-CM | POA: Diagnosis not present

## 2020-09-25 DIAGNOSIS — R5383 Other fatigue: Secondary | ICD-10-CM | POA: Diagnosis not present

## 2020-09-25 DIAGNOSIS — Z125 Encounter for screening for malignant neoplasm of prostate: Secondary | ICD-10-CM

## 2020-09-25 DIAGNOSIS — R0683 Snoring: Secondary | ICD-10-CM

## 2020-09-25 DIAGNOSIS — E7800 Pure hypercholesterolemia, unspecified: Secondary | ICD-10-CM | POA: Diagnosis not present

## 2020-09-25 DIAGNOSIS — S99922A Unspecified injury of left foot, initial encounter: Secondary | ICD-10-CM

## 2020-09-25 DIAGNOSIS — H9193 Unspecified hearing loss, bilateral: Secondary | ICD-10-CM | POA: Diagnosis not present

## 2020-09-25 DIAGNOSIS — Z0000 Encounter for general adult medical examination without abnormal findings: Secondary | ICD-10-CM

## 2020-09-25 DIAGNOSIS — Z2821 Immunization not carried out because of patient refusal: Secondary | ICD-10-CM

## 2020-09-25 DIAGNOSIS — I10 Essential (primary) hypertension: Secondary | ICD-10-CM

## 2020-09-25 DIAGNOSIS — Z79899 Other long term (current) drug therapy: Secondary | ICD-10-CM | POA: Diagnosis not present

## 2020-09-25 NOTE — Progress Notes (Signed)
Steve Burton is a 65 y.o. male who presents for annual wellness visit and follow-up on chronic medical conditions.  He has the following concerns:  His wife.  He is in a wheelchair. History of CVA and left-sided hemiparesis. Complains of a 2-week history of fatigue and waking up earlier than usual. Snoring but no apnea.   2 months ago he injured his left great toe nail.  States the toenail has been peeling away and he has a large dark area beneath the toenail. Does not have sensation on left side due to previous CVA.   3rd finger nail on right hand with a black line.  He was advised to see his dermatologist but he has not yet.  Hard of hearing and has been advised to see audiology but he declines.  Declines flu vaccine, COVID booster.   Immunization History  Administered Date(s) Administered   [employer] Vaccination 04/30/2019, 05/28/2019   Td 07/25/2014   Last colonoscopy: 2016- next due in 2026 Last PSA: 06/05/2019 Dentist: every 6 months Ophtho: Dr.Groat- cataract surgery Exercise: Walks with cane around house once a day  Other doctors caring for patient include:  Dr.Sethi's office - Neurology Dr.Crenshaw- Cardiology Urologist- [facility] - has not been able to schedule appointment his calls has not been going through to office. Not currently having any urology issues    Depression screen:  See questionnaire below.     Depression screen [facility] 2/9 09/25/2020 06/05/2019 04/05/2018 11/21/2016 07/25/2014  Decreased Interest 0 0 0 0 0  Down, Depressed, Hopeless 0 0 0 0 0  PHQ - 2 Score 0 0 0 0 0    Fall Screen: See Questionaire below.   Fall Risk  09/25/2020 06/05/2019 04/05/2018 11/21/2016 05/16/2013  Falls in the past year? 0 0 0 Yes No  Number falls in past yr: 0 0 0 1 -  Injury with Fall? 0 0 0 No -  Risk for fall due to : Impaired mobility;Impaired balance/gait - - - Impaired mobility;Impaired balance/gait  Follow up Falls evaluation completed - - - -    ADL screen:   See questionnaire below.  Functional Status Survey: Is the patient deaf or have difficulty hearing?: Yes Does the patient have difficulty seeing, even when wearing glasses/contacts?: No Does the patient have difficulty concentrating, remembering, or making decisions?: No Does the patient have difficulty walking or climbing stairs?: Yes Does the patient have difficulty dressing or bathing?: Yes (can dress and bathe self just needs help with buttons) Does the patient have difficulty doing errands alone such as visiting a doctor's office or shopping?: Yes   End of Life Discussion:  Patient does not have a living will and medical power of attorney. MOST form reviewed and signed. Paperwork reviewed and provided.    Review of Systems  Constitutional: -fever, -chills, -sweats, -unexpected weight change, -anorexia, +fatigue Allergy: -sneezing, -itching, -congestion Dermatology: denies changing moles, rash, lumps, new worrisome lesions ENT: -runny nose, -ear pain, -sore throat, -hoarseness, -sinus pain, -teeth pain, -tinnitus, -hearing loss, -epistaxis Cardiology:  -chest pain, -palpitations, -edema, -orthopnea, -paroxysmal nocturnal dyspnea Respiratory: -cough, -shortness of breath, -dyspnea on exertion, -wheezing, -hemoptysis Gastroenterology: -abdominal pain, -nausea, -vomiting, -diarrhea, -constipation, -blood in stool, -changes in bowel movement, -dysphagia Hematology: -bleeding or bruising problems Musculoskeletal: -arthralgias, -myalgias, -joint swelling, -back pain, -neck pain, -cramping, -gait changes Ophthalmology: -vision changes, -eye redness, -itching, -discharge Urology: -dysuria, -difficulty urinating, -hematuria, -urinary frequency, -urgency, incontinence Neurology: -headache, -weakness, -tingling, -numbness, -speech abnormality, -memory loss, -falls, -dizziness Psychology:  -depressed  mood, -agitation, -sleep problems   PHYSICAL EXAM:  BP 122/70   Pulse (!) 59   Temp (!) 97  F (36.1 C) (Tympanic)   Ht 5\' 11"  (1.803 m)   Wt 165 lb 12.8 oz (75.2 kg)   SpO2 98%   BMI 23.12 kg/m   General Appearance: Alert, cooperative, no distress, appears stated age Head: Normocephalic, without obvious abnormality, atraumatic Eyes: PERRL, conjunctiva/corneas clear, EOM's intact, fundi benign Ears: Normal TM's and external ear canals Nose: Nares normal, mucosa normal, no drainage or sinus   tenderness Throat: Lips, mucosa, and tongue normal; teeth and gums normal Neck: Supple, no lymphadenopathy, thyroid:no enlargement/tenderness/nodules; no carotid bruit or JVD Back: Spine nontender, no curvature, ROM normal, no CVA tenderness Lungs: Clear to auscultation bilaterally without wheezes, rales or ronchi; respirations unlabored Chest Wall: No tenderness or deformity Heart: Regular rate and rhythm, S1 and S2 normal, no murmur, rub or gallop Breast Exam: No chest wall tenderness, masses or gynecomastia Abdomen: Soft, non-tender, nondistended, normoactive bowel sounds, no masses, no hepatosplenomegaly Genitalia: Normal male external genitalia without lesions.  Testicles without masses.  No inguinal hernias. Rectal: Normal sphincter tone, no masses or tenderness; guaiac negative stool.  Prostate smooth, no nodules, not enlarged. Extremities: No clubbing, cyanosis or edema Pulses: 2+ and symmetric all extremities Skin: Skin color, texture, turgor normal, no rashes or lesions Lymph nodes: Cervical, supraclavicular, and axillary nodes normal Neurologic: CNII-XII intact, normal strength, sensation and gait; reflexes 2+ and symmetric throughout   Psych: Normal mood, affect, hygiene and grooming  ASSESSMENT/PLAN: Medicare annual wellness visit, subsequent -He does have some difficulty with ADLs but his wife helps him and they are managing fine.  No falls.  Denies memory or mood concerns. Advanced directive counseling done.  Immunizations reviewed and offered.  Essential hypertension -  Plan: CBC with Differential/Platelet, Comprehensive metabolic panel -Blood pressure well controlled.  Continue current medication and low-sodium diet.  Followed by cardiology  Elevated LDL cholesterol level - Plan: Lipid panel -Continue statin therapy.  Follow-up pending lipid panel results  Injury of toenail of left foot, initial encounter - Plan: Ambulatory referral to Podiatry -No sign of infection  Left spastic hemiparesis ([place]) -Continue therapy at home.  Doing well on baclofen.  Fatigue, unspecified type - Plan: CBC with Differential/Platelet, Comprehensive metabolic panel, TSH, T4, free, T3, Vitamin B12, VITAMIN D 25 Hydroxy (Vit-D Deficiency, Fractures) -Discussed possible etiologies.  Fatigue has improved over the past 2 days.  Screening for sleep apnea and my suspicion is low.  Encouraged him to avoid daytime naps and increase activity during the day so that he is able to go back to his usual sleep routine.  Follow-up pending lab results.  Snoring -Epworth sleepiness scale is 4  Screening for prostate cancer - Plan: PSA -He has been under the care of urology in the past.  Currently asymptomatic.  Follow-up pending results  History of stroke  Decreased hearing, bilateral -Declines audiology referral  Immunization refused   Discussed PSA screening (risks/benefits), recommended at least 30 minutes of aerobic activity at least 5 days/week; proper sunscreen use reviewed; healthy diet and alcohol recommendations (less than or equal to 2 drinks/day) reviewed; regular seatbelt use; changing batteries in smoke detectors. Immunization recommendations discussed.  Colonoscopy recommendations reviewed.   Medicare Attestation I have personally reviewed: The patient's medical and social history Their use of alcohol, tobacco or illicit drugs Their current medications and supplements The patient's functional ability including ADLs,fall risks, home safety risks, cognitive, and hearing  and  visual impairment Diet and physical activities Evidence for depression or mood disorders  The patient's weight, height, and BMI have been recorded in the chart.  I have made referrals, counseling, and provided education to the patient based on review of the above and I have provided the patient with a written personalized care plan for preventive services.     Harland Dingwall, NP-C   09/25/2020

## 2020-09-25 NOTE — Patient Instructions (Signed)
  Steve Burton , Thank you for taking time to come for your Medicare Wellness Visit. I appreciate your ongoing commitment to your health goals. Please review the following plan we discussed and let me know if I can assist you in the future.   These are the goals we discussed:  You will receive a call from [facility].   Try to avoid daytime naps.   We will be in touch with your lab results.     This is a list of the screening recommended for you and due dates:  Health Maintenance  Topic Date Due   HIV Screening  Never done   Zoster (Shingles) Vaccine (1 of 2) Never done   COVID-19 Vaccine (3 - Booster for Moderna series) 10/28/2019   Tetanus Vaccine  07/24/2024   Colon Cancer Screening  10/14/2024   Hepatitis C Screening: USPSTF Recommendation to screen - Ages 18-79 yo.  Completed   HPV Vaccine  Aged Out   Flu Shot  Discontinued

## 2020-09-26 LAB — CBC WITH DIFFERENTIAL/PLATELET
Basophils Absolute: 0.1 10*3/uL (ref 0.0–0.2)
Basos: 1 %
EOS (ABSOLUTE): 0.2 10*3/uL (ref 0.0–0.4)
Eos: 3 %
Hematocrit: 38.2 % (ref 37.5–51.0)
Hemoglobin: 12.7 g/dL — ABNORMAL LOW (ref 13.0–17.7)
Immature Grans (Abs): 0 10*3/uL (ref 0.0–0.1)
Immature Granulocytes: 0 %
Lymphocytes Absolute: 1.5 10*3/uL (ref 0.7–3.1)
Lymphs: 20 %
MCH: 30 pg (ref 26.6–33.0)
MCHC: 33.2 g/dL (ref 31.5–35.7)
MCV: 90 fL (ref 79–97)
Monocytes Absolute: 0.6 10*3/uL (ref 0.1–0.9)
Monocytes: 8 %
Neutrophils Absolute: 5.2 10*3/uL (ref 1.4–7.0)
Neutrophils: 68 %
Platelets: 150 10*3/uL (ref 150–450)
RBC: 4.23 x10E6/uL (ref 4.14–5.80)
RDW: 12.9 % (ref 11.6–15.4)
WBC: 7.6 10*3/uL (ref 3.4–10.8)

## 2020-09-26 LAB — COMPREHENSIVE METABOLIC PANEL
ALT: 18 IU/L (ref 0–44)
AST: 15 IU/L (ref 0–40)
Albumin/Globulin Ratio: 2.6 — ABNORMAL HIGH (ref 1.2–2.2)
Albumin: 4.6 g/dL (ref 3.8–4.8)
Alkaline Phosphatase: 101 IU/L (ref 44–121)
BUN/Creatinine Ratio: 16 (ref 10–24)
BUN: 16 mg/dL (ref 8–27)
Bilirubin Total: 0.5 mg/dL (ref 0.0–1.2)
CO2: 25 mmol/L (ref 20–29)
Calcium: 9.4 mg/dL (ref 8.6–10.2)
Chloride: 106 mmol/L (ref 96–106)
Creatinine, Ser: 0.99 mg/dL (ref 0.76–1.27)
Globulin, Total: 1.8 g/dL (ref 1.5–4.5)
Glucose: 106 mg/dL — ABNORMAL HIGH (ref 65–99)
Potassium: 3.8 mmol/L (ref 3.5–5.2)
Sodium: 145 mmol/L — ABNORMAL HIGH (ref 134–144)
Total Protein: 6.4 g/dL (ref 6.0–8.5)
eGFR: 86 mL/min/{1.73_m2} (ref >59)

## 2020-09-26 LAB — T3: T3, Total: 110 ng/dL (ref 71–180)

## 2020-09-26 LAB — T4, FREE: Free T4: 1.47 ng/dL (ref 0.82–1.77)

## 2020-09-26 LAB — TSH: TSH: 1.24 u[IU]/mL (ref 0.450–4.500)

## 2020-09-26 LAB — LIPID PANEL
Chol/HDL Ratio: 2.6 ratio (ref 0.0–5.0)
Cholesterol, Total: 156 mg/dL (ref 100–199)
HDL: 59 mg/dL (ref >39)
LDL Chol Calc (NIH): 85 mg/dL (ref 0–99)
Triglycerides: 60 mg/dL (ref 0–149)
VLDL Cholesterol Cal: 12 mg/dL (ref 5–40)

## 2020-09-26 LAB — VITAMIN B12: Vitamin B-12: 552 pg/mL (ref 232–1245)

## 2020-09-26 LAB — PSA: Prostate Specific Ag, Serum: 2 ng/mL (ref 0.0–4.0)

## 2020-09-26 LAB — VITAMIN D 25 HYDROXY (VIT D DEFICIENCY, FRACTURES): Vit D, 25-Hydroxy: 38.2 ng/mL (ref 30.0–100.0)

## 2020-10-08 ENCOUNTER — Other Ambulatory Visit: Payer: Self-pay | Admitting: Family Medicine

## 2020-10-08 DIAGNOSIS — I10 Essential (primary) hypertension: Secondary | ICD-10-CM

## 2020-10-13 ENCOUNTER — Ambulatory Visit: Payer: Medicare Other | Admitting: Podiatry

## 2020-10-13 ENCOUNTER — Encounter: Payer: Self-pay | Admitting: Podiatry

## 2020-10-13 ENCOUNTER — Other Ambulatory Visit: Payer: Self-pay

## 2020-10-13 DIAGNOSIS — L603 Nail dystrophy: Secondary | ICD-10-CM | POA: Diagnosis not present

## 2020-10-13 NOTE — Progress Notes (Signed)
  Subjective:  Patient ID: Steve Burton, male    DOB: 06-20-1955,   MRN: [id number]  Chief Complaint  Patient presents with   Nail Problem    Left great toenail came off completely 4 months ago. Pt now has a new nail that has a blood spot on nail bed. Pt PCP referred pt to our office to have nail completely removed per pt.     65 y.o. male presents for concern for discoloration of left big toenails. States nail was injured 6 months ago and came off but still has some discoloration and damage. PCP referred over for nail to be removed. Denies any pain with the nail . Denies any other pedal complaints. Denies n/v/f/c.   Past Medical History:  Diagnosis Date   Elevated LDL cholesterol level    Elevated PSA measurement 11/22/2016   Hyperlipidemia    Hypertension    Impaired fasting glucose 11/22/2016   Left hemiparesis (HCC)    Spastic hemiparesis (HCC)    Stroke ([place]) 2011    Objective:  Physical Exam: Vascular: DP/PT pulses 2/4 bilateral. CFT <3 seconds. Normal hair growth on digits. No edema.  Skin. No lacerations or abrasions bilateral feet. Left hallux nail with discoloration and thickness with hemosiderin underlying. Nail below appears healthy.  Musculoskeletal: MMT 5/5 bilateral lower extremities in DF, PF, Inversion and Eversion. Deceased ROM in DF of ankle joint.  Neurological: Sensation intact to light touch.   Assessment:   1. Onychodystrophy      Plan:  Patient was evaluated and treated and all questions answered. -Examined patient -Discussed treatment options for dystrophic nails  - Nail was trimmed back and underlying nail looks healthy.  -Recommend continue to watch nail but belief it will grow out well.  -Patient to return as needed.    Lorenda Peck, DPM

## 2020-11-25 ENCOUNTER — Other Ambulatory Visit: Payer: Self-pay | Admitting: Adult Health

## 2020-11-30 ENCOUNTER — Telehealth: Payer: Self-pay

## 2020-11-30 ENCOUNTER — Other Ambulatory Visit: Payer: Self-pay | Admitting: Medical

## 2020-11-30 MED ORDER — BACLOFEN 10 MG PO TABS: 10.0000 mg | ORAL_TABLET | Freq: Three times a day (TID) | ORAL | 0 refills | Status: DC

## 2020-11-30 NOTE — Telephone Encounter (Signed)
We recently started following him for seizures. He had his prior stroke back in 2011 with residual spasticity but long term use of baclofen for spasticity can be seen but not very common (not sure if he has been on baclofen since that time). I apologize I cannot be of more help - this medication was not discussed at prior visit. Please let me know if you have any other questions!

## 2020-11-30 NOTE — Telephone Encounter (Signed)
Left message for pt to call back to schedule a med check

## 2020-11-30 NOTE — Telephone Encounter (Signed)
Recv'd fax from [facility] requesting refill Baclofen 10mg  #[phone]

## 2020-12-01 NOTE — Telephone Encounter (Signed)
Left message for pt to call back to schedule an appt for med check

## 2020-12-11 ENCOUNTER — Ambulatory Visit (INDEPENDENT_AMBULATORY_CARE_PROVIDER_SITE_OTHER): Payer: Medicare Other | Admitting: Medical

## 2020-12-11 ENCOUNTER — Other Ambulatory Visit: Payer: Self-pay

## 2020-12-11 VITALS — BP 140/90 | HR 69 | Temp 98.6°F

## 2020-12-11 DIAGNOSIS — R252 Cramp and spasm: Secondary | ICD-10-CM | POA: Diagnosis not present

## 2020-12-11 DIAGNOSIS — Z8673 Personal history of transient ischemic attack (TIA), and cerebral infarction without residual deficits: Secondary | ICD-10-CM

## 2020-12-11 DIAGNOSIS — I69398 Other sequelae of cerebral infarction: Secondary | ICD-10-CM

## 2020-12-11 DIAGNOSIS — M7061 Trochanteric bursitis, right hip: Secondary | ICD-10-CM

## 2020-12-11 DIAGNOSIS — G8114 Spastic hemiplegia affecting left nondominant side: Secondary | ICD-10-CM

## 2020-12-11 MED ORDER — HYDROCODONE-ACETAMINOPHEN 7.5-325 MG PO TABS: 1.0000 | ORAL_TABLET | Freq: Two times a day (BID) | ORAL | 0 refills | Status: DC | PRN

## 2020-12-11 MED ORDER — LIDOCAINE HCL 1 % IJ SOLN
2.5000 mL | Freq: Once | INTRAMUSCULAR | Status: AC
  Administered 2020-12-11: 2.5 mL via INTRADERMAL

## 2020-12-11 MED ORDER — TRIAMCINOLONE ACETONIDE 40 MG/ML IJ SUSP
20.0000 mg | Freq: Once | INTRAMUSCULAR | Status: AC
  Administered 2020-12-11: 20 mg via INTRAMUSCULAR

## 2020-12-11 NOTE — Progress Notes (Signed)
Subjective:  Steve Burton is a 65 y.o. male who presents for Chief Complaint  Patient presents with   Hip Pain    Severe hip pain 2 days. In so much pain he was crying this morning- can't sleep due to pain. Pain is going down the leg     Here today with his wife.  He is seated in a wheelchair as usual  Hip pain x2 days.  Hurts to lie on the side.  He has been tossing and turning in bed trying to get in a comfortable position lately.  No recent fall or trauma.  No bruising or swelling.  No fever.  Otherwise been in usual state of health.  No other aggravating or relieving factors.    No other c/o.  The following portions of the patient's history were reviewed and updated as appropriate: allergies, current medications, past family history, past medical history, past social history, past surgical history and problem list.  ROS Otherwise as in subjective above  Objective: BP 140/90   Pulse 69   Temp 98.6 F (37 C)   General appearance: alert, no distress, well developed, well nourished He seems to be tender over the right greater trochanter bursa, but otherwise no obvious tenderness to palpation.  Hip range of motion on the right seems full without pain.  No other leg tenderness.  No other pain with range of motion particularly with passive range of motion.  Left leg with hemiparesis from prior stroke but nontender no obvious deformity or swelling. Lumbar spine nontender.  Buttocks nontender Pulses: 2+ radial pulses, 2+ pedal pulses, normal cap refill Ext: no edema   Assessment: Encounter Diagnoses  Name Primary?   Trochanteric bursitis of right hip Yes   History of stroke    Left spastic hemiparesis (HCC)    Spasticity as late effect of cerebrovascular accident (CVA)      Plan: We discussed his acute pain that seems to be bursitis of the right hip.  He is given options.  He agrees to steroid injection for bursa.  Discussed risk and benefits of procedure.  Patient gives  consent.  Cleaned and prepped area in usual sterile fashion.   Injected the right trochanteric bursa with 20 mg of triamcinolone in 2.5 cc of Lidocaine with a 20-gauge needle (10cc syringe).  Estimated blood loss less than 1 cc, patient tolerated procedure well.  Discussed aftercare.   Advise several days of rest, avoid lying directly on that side, use cold therapy, ice therapy 20 minutes on 20 minutes off.  Pain medicine below as needed for breakthrough pain.  If not much improved over the next week then call back  He continues on baclofen regularly for spasticity , history of stroke and hemiparesis   Steve Burton was seen today for hip pain.  Diagnoses and all orders for this visit:  Trochanteric bursitis of right hip -     lidocaine (XYLOCAINE) 1 % (with pres) injection 2.5 mL -     triamcinolone acetonide (KENALOG-40) injection 20 mg  History of stroke  Left spastic hemiparesis (HCC)  Spasticity as late effect of cerebrovascular accident (CVA)  Other orders -     HYDROcodone-acetaminophen (NORCO) 7.5-325 MG tablet; Take 1 tablet by mouth 2 (two) times daily as needed for up to 5 days for moderate pain.   Follow up: As needed

## 2020-12-16 ENCOUNTER — Other Ambulatory Visit: Payer: Self-pay | Admitting: Medical

## 2020-12-16 ENCOUNTER — Telehealth: Payer: Self-pay | Admitting: Medical

## 2020-12-16 MED ORDER — HYDROCODONE-ACETAMINOPHEN 7.5-325 MG PO TABS: 1.0000 | ORAL_TABLET | Freq: Two times a day (BID) | ORAL | 0 refills | Status: AC | PRN

## 2020-12-16 NOTE — Telephone Encounter (Signed)
Call and see how he is doing since visit here regarding leg pain last week?

## 2020-12-17 NOTE — Telephone Encounter (Signed)
I have tried to call pt multiple times but the number is not going through. I even tried on another phone and it doesn't go either

## 2020-12-18 NOTE — Telephone Encounter (Signed)
Tried to call pt but sent a mychart

## 2021-01-06 ENCOUNTER — Other Ambulatory Visit: Payer: Self-pay

## 2021-01-06 ENCOUNTER — Encounter: Payer: Self-pay | Admitting: Medical

## 2021-01-06 ENCOUNTER — Ambulatory Visit (INDEPENDENT_AMBULATORY_CARE_PROVIDER_SITE_OTHER): Payer: Medicare Other | Admitting: Medical

## 2021-01-06 VITALS — BP 124/82 | HR 62 | Wt 166.6 lb

## 2021-01-06 DIAGNOSIS — E785 Hyperlipidemia, unspecified: Secondary | ICD-10-CM

## 2021-01-06 DIAGNOSIS — L609 Nail disorder, unspecified: Secondary | ICD-10-CM

## 2021-01-06 DIAGNOSIS — I6930 Unspecified sequelae of cerebral infarction: Secondary | ICD-10-CM

## 2021-01-06 DIAGNOSIS — R29898 Other symptoms and signs involving the musculoskeletal system: Secondary | ICD-10-CM | POA: Insufficient documentation

## 2021-01-06 DIAGNOSIS — I69398 Other sequelae of cerebral infarction: Secondary | ICD-10-CM | POA: Diagnosis not present

## 2021-01-06 DIAGNOSIS — D539 Nutritional anemia, unspecified: Secondary | ICD-10-CM | POA: Diagnosis not present

## 2021-01-06 DIAGNOSIS — G8114 Spastic hemiplegia affecting left nondominant side: Secondary | ICD-10-CM | POA: Diagnosis not present

## 2021-01-06 DIAGNOSIS — D509 Iron deficiency anemia, unspecified: Secondary | ICD-10-CM | POA: Diagnosis not present

## 2021-01-06 DIAGNOSIS — I10 Essential (primary) hypertension: Secondary | ICD-10-CM

## 2021-01-06 DIAGNOSIS — I421 Obstructive hypertrophic cardiomyopathy: Secondary | ICD-10-CM

## 2021-01-06 DIAGNOSIS — R7301 Impaired fasting glucose: Secondary | ICD-10-CM | POA: Diagnosis not present

## 2021-01-06 DIAGNOSIS — R972 Elevated prostate specific antigen [PSA]: Secondary | ICD-10-CM

## 2021-01-06 DIAGNOSIS — R252 Cramp and spasm: Secondary | ICD-10-CM

## 2021-01-06 DIAGNOSIS — D649 Anemia, unspecified: Secondary | ICD-10-CM

## 2021-01-06 DIAGNOSIS — H9193 Unspecified hearing loss, bilateral: Secondary | ICD-10-CM

## 2021-01-06 NOTE — Progress Notes (Signed)
Subjective:  Steve Burton is a 66 y.o. male who presents for Chief Complaint  Patient presents with   Hypertension   Hyperlipidemia   Bursitis    Requests PT for strength rebuilding to [facility] on [address]     Here for routine med check.   Accompanied by wife [place].  Was formerly seeing Harland Dingwall, NP before she left our practice in late 2022.  I have seen him for a few acute issues, but this is my first med check /chronic disease follow up with him.  He has a history of hypertension, hyperlipidemia, melanoma of skin, history of seizure, hypertrophic obstructive cardiomyopathy, history of stroke, decreased hearing, impaired fasting glucose, history of elevated PSA, left spastic hemiparesis.   Medical team: Dr. Antony Contras and Frann Rider, NP, [facility] Dr. Kirk Ruths, cardiology [facility] Dr. Lorenda Peck, podiatry Dr. Lavonna Monarch, dermatology No eye doctor in a while Sees Dentist  He had a stroke in 2011 and subsequent spasticity and hemiparesis on the left side.  He has had seizures about 3 years ago.  He uses a cane and a wheelchair at times.  He uses a splint for the left wrist.  His wife helps do some stretching and home therapy with him every night with his arm to prevent worsening spasticity  Lately he has had some right leg weakness ongoing.  Would like to see physical therapy and see cannot quite regain the function he would like with his right leg.  No recent trauma or injury.  Recent mild anemia on labs-no current bleeding or bruising.  No fatigue  Been married since 2007.  This is his third wife.  They get along great.  History of impaired glucose- No polyuria, +thirsty in evening  He wants to recheck on fingernail finding that we discussed last visit, right middle finger been there for years but lately seems to change in width and thickness  No other new concerns  No other aggravating or relieving factors.    No other  c/o.  Past Medical History:  Diagnosis Date   Elevated LDL cholesterol level    Elevated PSA measurement 11/22/2016   Hyperlipidemia    Hypertension    Impaired fasting glucose 11/22/2016   Left hemiparesis (HCC)    Spastic hemiparesis (HCC)    Stroke ([place]) 2011   Current Outpatient Medications on File Prior to Visit  Medication Sig Dispense Refill   atorvastatin (LIPITOR) 10 MG tablet TAKE 1 TABLET BY MOUTH  DAILY 90 tablet 1   baclofen (LIORESAL) 10 MG tablet Take 1 tablet (10 mg total) by mouth 3 (three) times daily. 270 tablet 0   diltiazem (CARDIZEM CD) 240 MG 24 hr capsule TAKE 1 CAPSULE BY MOUTH  DAILY 90 capsule 3   labetalol (NORMODYNE) 100 MG tablet TAKE 1 TABLET BY MOUTH  TWICE DAILY 180 tablet 3   labetalol (NORMODYNE) 200 MG tablet TAKE 1 TABLET BY MOUTH  TWICE DAILY 180 tablet 3   levETIRAcetam (KEPPRA) 500 MG tablet TAKE 1 TABLET BY MOUTH  TWICE DAILY 180 tablet 3   losartan (COZAAR) 100 MG tablet TAKE 1 TABLET BY MOUTH  DAILY 90 tablet 3   Multiple Vitamins-Minerals (CENTRUM SILVER 50+MEN) TABS Take 1 tablet by mouth daily with lunch.     potassium chloride SA (KLOR-CON) 20 MEQ tablet TAKE 2 TABLETS BY MOUTH  DAILY 60 tablet 1   spironolactone (ALDACTONE) 25 MG tablet TAKE 1 TABLET BY MOUTH  DAILY 90 tablet 0  No current facility-administered medications on file prior to visit.     The following portions of the patient's history were reviewed and updated as appropriate: allergies, current medications, past family history, past medical history, past social history, past surgical history and problem list.  ROS Otherwise as in subjective above  Objective: BP 124/82 (BP Location: Right Arm, Patient Position: Sitting)    Pulse 62    Wt 166 lb 9.6 oz (75.6 kg)    SpO2 97%    BMI 23.24 kg/m   BP Readings from Last 3 Encounters:  01/06/21 124/82  12/11/20 140/90  09/25/20 122/70    General appearance: alert, no distress, well developed, well nourished Skin:  Right fingernail of middle finger distally centrally with a brownish rectangular Marking that fades as you go towards the nailbed Neck: supple, no lymphadenopathy, no thyromegaly, no masses Heart: RRR, normal S1, S2, no murmurs Lungs: CTA bilaterally, no wheezes, rhonchi, or rales MSK: Right hip with relatively normal range of motion, nontender leg Left arm and leg with decreased tone and spasticity throughout Pulses: 2+ radial pulses, 2+ pedal pulses, normal cap refill Ext: no edema   Assessment: Encounter Diagnoses  Name Primary?   Essential hypertension Yes   History of hemorrhagic cerebrovascular accident (CVA) with residual deficit    Spasticity as late effect of cerebrovascular accident (CVA)    Left spastic hemiparesis (HCC)    Impaired fasting glucose    Hypertrophic obstructive cardiomyopathy (HCC)    Hyperlipidemia, unspecified hyperlipidemia type    Decreased hearing, bilateral    Elevated PSA measurement    Iron deficiency anemia, unspecified iron deficiency anemia type    Right leg weakness    Nail lesion      Plan: History of stroke, left spastic hemiparesis, spasticity-uses baclofen regularly, does daily home exercises and stretching  Right leg weakness-referral to physical therapy  Right middle finger nail linear marking I sent an electronic E consult for specialty provider review through [facility] on their behalf regarding nail lesion.  I will get back in touch with patient pending recommendations from the [place] consult.  History of seizure-compliant with Keppra for the last 3 years, sees neurology  Hypertension-continue current medication, diltiazem CD 240 mg daily, labetalol 300 mg twice daily, losartan 100 mg daily, spironolactone 25 mg daily  History of hypertension hypertrophic obstructive cardiomyopathy-sees cardiology.  I reviewed the July 2022 cardiology notes.  It seems like he is due back for repeat imaging/echo  Hyperlipidemia-continue  atorvastatin 10 mg daily  Impaired glucose-updated labs today  Mild anemia on recent blood counts and has been mildly anemic for a while.  Labs today for folate and iron and Hemoccult card sent home  History of elevated PSA - last PSA 09/2020 normal at 2.0.  I reviewed 2019 Urology notes   Izear was seen today for hypertension, hyperlipidemia and bursitis.  Diagnoses and all orders for this visit:  Essential hypertension  History of hemorrhagic cerebrovascular accident (CVA) with residual deficit  Spasticity as late effect of cerebrovascular accident (CVA)  Left spastic hemiparesis (HCC)  Impaired fasting glucose -     Hemoglobin A1c  Hypertrophic obstructive cardiomyopathy (HCC)  Hyperlipidemia, unspecified hyperlipidemia type  Decreased hearing, bilateral  Elevated PSA measurement  Iron deficiency anemia, unspecified iron deficiency anemia type -     Iron -     Folate -     Fecal occult blood, imunochemical([facility])  Right leg weakness  Nail lesion    Follow up: pending labs

## 2021-01-07 LAB — FOLATE: Folate: >20 ng/mL (ref >3.0)

## 2021-01-07 LAB — HEMOGLOBIN A1C
Est. average glucose Bld gHb Est-mCnc: 117 mg/dL
Hgb A1c MFr Bld: 5.7 % — ABNORMAL HIGH (ref 4.8–5.6)

## 2021-01-07 LAB — IRON: Iron: 84 ug/dL (ref 38–169)

## 2021-01-07 NOTE — Addendum Note (Signed)
Addended by: Minette Headland A on: 01/07/2021 08:30 AM   Modules accepted: Orders

## 2021-01-08 DIAGNOSIS — D509 Iron deficiency anemia, unspecified: Secondary | ICD-10-CM | POA: Diagnosis not present

## 2021-01-13 ENCOUNTER — Other Ambulatory Visit: Payer: Self-pay | Admitting: Cardiology

## 2021-01-13 DIAGNOSIS — I10 Essential (primary) hypertension: Secondary | ICD-10-CM

## 2021-01-13 LAB — FECAL OCCULT BLOOD, IMMUNOCHEMICAL: Fecal Occult Bld: NEGATIVE

## 2021-01-22 DIAGNOSIS — R262 Difficulty in walking, not elsewhere classified: Secondary | ICD-10-CM | POA: Diagnosis not present

## 2021-01-22 DIAGNOSIS — R29898 Other symptoms and signs involving the musculoskeletal system: Secondary | ICD-10-CM | POA: Diagnosis not present

## 2021-01-24 ENCOUNTER — Other Ambulatory Visit: Payer: Self-pay | Admitting: Cardiology

## 2021-01-24 ENCOUNTER — Other Ambulatory Visit: Payer: Self-pay | Admitting: Medical

## 2021-01-25 ENCOUNTER — Other Ambulatory Visit: Payer: Self-pay

## 2021-01-25 MED ORDER — ATORVASTATIN CALCIUM 10 MG PO TABS: 10.0000 mg | ORAL_TABLET | Freq: Every day | ORAL | 1 refills | Status: DC

## 2021-01-27 ENCOUNTER — Encounter: Payer: Self-pay | Admitting: Adult Health

## 2021-01-27 ENCOUNTER — Ambulatory Visit: Payer: Medicare Other | Admitting: Adult Health

## 2021-01-27 VITALS — BP 127/73 | HR 57 | Ht 68.0 in | Wt 176.0 lb

## 2021-01-27 DIAGNOSIS — G40219 Localization-related (focal) (partial) symptomatic epilepsy and epileptic syndromes with complex partial seizures, intractable, without status epilepticus: Secondary | ICD-10-CM

## 2021-01-27 DIAGNOSIS — I610 Nontraumatic intracerebral hemorrhage in hemisphere, subcortical: Secondary | ICD-10-CM | POA: Diagnosis not present

## 2021-01-27 NOTE — Progress Notes (Signed)
[facility] [address] [place] [address] [phone]       OFFICE FOLLOW UP NOTE  [name] Date of Birth:  [birth year] Medical Record Number:  [id number]   [facility] provider: Dr. Leonie Man Referring MD: Octaviano Glow Reason for Referral: Seizure    CC: Seizure   HPI:   Update 01/27/2021 JM: Returns for 1 year seizure follow-up.  Overall stable without seizure activity.  Compliant on Keppra 500 mg twice daily without side effects. He does question possibly coming off Keppra as seizure free for almost 2 years.  History of prior stroke with residual left spastic hemiparesis stable, denies new stroke/TIA symptoms.  Routinely follows with PCP.  No new concerns at this time.   History provided for reference purposes only Update 01/28/2020 JM: Steve Burton returns for 47-month seizure follow-up accompanied by his wife.  Doing well since prior visit without any additional seizures and remains on Keppra 500 mg twice daily without side effects.  Post stroke left spastic hemiparesis stable without worsening and denies new stroke/TIA symptoms.  Continues to ambulate with a cane and denies any recent falls.  He exercises routinely and goes to the gym frequently.  No concerns at this time.  Update 07/25/2019 JM: [name] returns for seizure follow-up accompanied by his wife [name].  He has been stable since prior visit without recurrent seizure activity or episodes.  Remains on Keppra 500 mg twice daily tolerating well.  Repeat EEG 05/20/2019 showed focal right hemispheric slowing likely related to sequela of prior stroke without definite seizure activity noted.  Remote history of right BG ICH in 2011 with residual spastic left hemiparesis which has been stable. Denies new stroke/TIA symptoms.  Blood pressure today 127/76.  No concerns at this time.  Initial visit 04/24/2019 Dr. Leonie Man: Steve Burton is a 66 year old Caucasian male seen today for initial office consultation  visit for seizure.  He is accompanied by his wife.  History is obtained from them, review of ER electronic medical records and I personally reviewed available imaging films in PACS.  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  He had a witnessed episode of seizure on 04/20/2019.  Patient was sitting on a computer listening to music concert when his wife was in the next room heard a loud sound and when she arrived she found him lying on the floor leaning to 1 side and having jerking movements.  His eyes were partially open but he had a glazed look and was unresponsive.  He had a tongue bite and he also had urinary incontinence.  She called EMS patient remained unresponsive while they arrived and did not regain consciousness till he reached the hospital.  He subsequently gradually returned back to his baseline. MRI scan of the brain was obtained on 04/20/2019 which showed no acute abnormality showed large area of encephalomalacia in the right basal ganglia extending up into the corona radiata with gliosis.  MRI of the brain showed no large vessel stenosis or occlusion.  Urine analysis was negative.  Basic metabolic panel labs unremarkable.  CBC and creatinine kinase were also normal.  Urine drug screen was negative.  Patient was given IV Keppra and discharged home with Keppra 500 mg twice daily.  Patient states he is taken it for last 3 days he had a episode of emotional lability and cried yesterday but feels fine now.  He denies dizziness  or other side effects.  I do not have access to the patient's previous admission in 2011 for intracerebral hemorrhage with apparently I saw the patient at that time.  The wife feels that he may have had a seizure at that time but was not discharged on any anticonvulsants.  He has no other history of hyper of head injury with loss of consciousness or seizures.  His  blood pressure has apparently been well controlled and he takes labetalol , Cozaar and Aldactone.  Today blood pressure is acceptable at 136/79 .  Patient denies any office triggers for his seizures in the form of fever, infection, dehydration or sleep deprivation or alcohol use.  He has significant residual spastic left hemiplegia from his address of the hemorrhage in 2011 and is able to walk with a hemiwalker but does not have much use of his left arm.  He has a left foot drop.  His wife still keeps a close watch on him but is able to do most activities for himself.  ROS:   14 system review of systems is positive for those listed in HPI and all other systems negative  PMH:  Past Medical History:  Diagnosis Date   Elevated LDL cholesterol level    Elevated PSA measurement 11/22/2016   Hyperlipidemia    Hypertension    Impaired fasting glucose 11/22/2016   Left hemiparesis (HCC)    Spastic hemiparesis ([place])    Stroke [facility]) 2011    Social History:  Social History   Socioeconomic History   Marital status: Married    Spouse name: Not on file   Number of children: Not on file   Years of education: Not on file   Highest education level: Not on file  Occupational History   Not on file  Tobacco Use   Smoking status: Never   Smokeless tobacco: Never  Substance and Sexual Activity   Alcohol use: Yes    Alcohol/week: 0.0 standard drinks    Comment: 1 glass of wine per week.    Drug use: No   Sexual activity: Not on file  Other Topics Concern   Not on file  Social History Narrative   Not on file   Social Determinants of Health   Financial Resource Strain: Not on file  Food Insecurity: Not on file  Transportation Needs: Not on file  Physical Activity: Not on file  Stress: Not on file  Social Connections: Not on file  Intimate Partner Violence: Not on file    Medications:   Current Outpatient Medications on File Prior to Visit  Medication Sig Dispense Refill   atorvastatin  (LIPITOR) 10 MG tablet Take 1 tablet (10 mg total) by mouth daily. 90 tablet 1   baclofen (LIORESAL) 10 MG tablet TAKE 1 TABLET BY MOUTH 3 TIMES  DAILY 270 tablet 0   diltiazem (CARDIZEM CD) 240 MG 24 hr capsule TAKE 1 CAPSULE BY MOUTH  DAILY 90 capsule 3   labetalol (NORMODYNE) 100 MG tablet TAKE 1 TABLET BY MOUTH  TWICE DAILY 180 tablet 3   labetalol (NORMODYNE) 200 MG tablet TAKE 1 TABLET BY MOUTH  TWICE DAILY 180 tablet 3   levETIRAcetam (KEPPRA) 500 MG tablet TAKE 1 TABLET BY MOUTH  TWICE DAILY 180 tablet 3   losartan (COZAAR) 100 MG tablet TAKE 1 TABLET BY MOUTH  DAILY 90 tablet 3   Multiple Vitamins-Minerals (CENTRUM SILVER 50+MEN) TABS Take 1 tablet by mouth daily with lunch.     potassium chloride SA (  KLOR-CON) 20 MEQ tablet TAKE 2 TABLETS BY MOUTH  DAILY 60 tablet 1   spironolactone (ALDACTONE) 25 MG tablet TAKE 1 TABLET BY MOUTH  DAILY 90 tablet 3   No current facility-administered medications on file prior to visit.    Allergies:   Allergies  Allergen Reactions   Phenytoin Sodium Extended Swelling and Other (See Comments)    Dilantin- Arm swelling    Penicillins Rash    Has patient had a PCN reaction causing immediate rash, facial/tongue/throat swelling, SOB or lightheadedness with hypotension: Unknown Has patient had a PCN reaction causing severe rash involving mucus membranes or skin necrosis: Unk Has patient had a PCN reaction that required hospitalization: Unk Has patient had a PCN reaction occurring within the last 10 years: Yes If all of the above answers are "NO", then may proceed with Cephalosporin use.      Today's Vitals   01/27/21 1134  BP: 127/73  Pulse: (!) 57  Weight: 176 lb (79.8 kg)  Height: 5\' 8"  (1.727 m)   Body mass index is 26.76 kg/m.   Physical Exam General: well developed, well nourished very pleasant middle-aged Caucasian male, seated, in no evident distress Head: head normocephalic and atraumatic.   Neck: supple with no carotid or  supraclavicular bruits Cardiovascular: regular rate and rhythm, no murmurs Musculoskeletal: no deformity except left foot drop Skin:  no rash/petichiae Vascular:  Normal pulses all extremities  Neurologic Exam Mental Status: Awake and fully alert. Oriented to place and time. Recent and remote memory intact. Attention span, concentration and fund of knowledge appropriate. Mood and affect appropriate.  Cranial Nerves: Pupils equal, briskly reactive to light. Extraocular movements full without nystagmus but saccadic dysmetria on right lateral gaze.. Visual fields full to confrontation. Hearing intact. Facial sensation intact.  Mild left lower facial asymmetry when he smiles only.  Tongue, palate moves normally and symmetrically.  Motor: Spastic left hemiparesis chronic; full strength RUE and RLE Sensory.:  Diminished left hemibody touch , pinprick , position and vibratory sensation.  Coordination: Rapid alternating movements normal on right side. Finger-to-nose and heel-to-shin performed accurately on right side. Gait and Station: deferred. Use of w/c today Reflexes: 2+ and asymmetric and brisker on the left. Toes downgoing.      ASSESSMENT/PLAN: 66 year old Caucasian male with solitary episode of weakness partial onset seizures with secondary generalization on 04/19/2019 likely symptomatic from remote hypertensive right basal ganglia intracerebral hemorrhage with late effect.   1.  Seizure, late effect of stroke -Denies recurrent seizure activity or episode -Continue Keppra 500 mg twice daily for seizure prophylaxis -refill provided -will plan on repeat EEG around May (wants to wait until after 1 month long [place] vaca in April) -if normal, can discuss slowly tapering medication -advised no driving for 6 months if stopped per [place] law   2. Hx of R BG ICH -03/2009 -Residual left spastic hemiparesis -stable - questions return to driving - provided information regarding driving rehab programs to  further assess safety and possible need of assistive device in car due to left sided weakness -Continue to follow with PCP regularly for continued secondary stroke prevention management   Follow-up in 1 year or call earlier if needed    CC:  Irene Pap, PA-C    I spent 30 minutes of face-to-face and non-face-to-face time with patient and wife.  This included previsit chart review, lab review, study review, order entry, electronic health record documentation, patient education regarding seizures and ongoing use of AED and possibly stopping,  history of ICH with residual deficits and importance of managing stroke risk factors and answered all questions to patients and wifes satisfaction   Frann Rider, AGNP-BC  [facility] [address] [place], [place] [address]  Phone [phone] Fax [phone] Note: This document was prepared with digital dictation and possible smart phrase technology. Any transcriptional errors that result from this process are unintentional.

## 2021-01-27 NOTE — Patient Instructions (Addendum)
Your Plan:  Continue Keppra 500 mg twice daily - refill provided  Plan on completing EEG around May - if normal, we can discuss coming off from your Keppra. If you do stop the keppra, you cannot drive for 6 months after per [place] law  If you wish to return to driving, would recommend evaluation at a driving rehab as you may need assistive devices in your car due to your left sided weakness  Continue to follow with your PCP for continued aggressive stroke risk factor management  Enjoy your [place] vacation!!     Follow-up in 1 year or call earlier if needed              Thank you for coming to see [place] at [facility]. I hope we have been able to provide you high quality care today.  You may receive a patient satisfaction survey over the next few weeks. We would appreciate your feedback and comments so that we may continue to improve ourselves and the health of our patients.

## 2021-01-29 DIAGNOSIS — R29898 Other symptoms and signs involving the musculoskeletal system: Secondary | ICD-10-CM | POA: Diagnosis not present

## 2021-01-29 DIAGNOSIS — R262 Difficulty in walking, not elsewhere classified: Secondary | ICD-10-CM | POA: Diagnosis not present

## 2021-02-12 DIAGNOSIS — R262 Difficulty in walking, not elsewhere classified: Secondary | ICD-10-CM | POA: Diagnosis not present

## 2021-02-12 DIAGNOSIS — R29898 Other symptoms and signs involving the musculoskeletal system: Secondary | ICD-10-CM | POA: Diagnosis not present

## 2021-02-19 DIAGNOSIS — R262 Difficulty in walking, not elsewhere classified: Secondary | ICD-10-CM | POA: Diagnosis not present

## 2021-02-19 DIAGNOSIS — R29898 Other symptoms and signs involving the musculoskeletal system: Secondary | ICD-10-CM | POA: Diagnosis not present

## 2021-02-26 DIAGNOSIS — R262 Difficulty in walking, not elsewhere classified: Secondary | ICD-10-CM | POA: Diagnosis not present

## 2021-02-26 DIAGNOSIS — R29898 Other symptoms and signs involving the musculoskeletal system: Secondary | ICD-10-CM | POA: Diagnosis not present

## 2021-03-06 ENCOUNTER — Other Ambulatory Visit: Payer: Self-pay | Admitting: Cardiology

## 2021-03-12 DIAGNOSIS — R29898 Other symptoms and signs involving the musculoskeletal system: Secondary | ICD-10-CM | POA: Diagnosis not present

## 2021-03-12 DIAGNOSIS — R262 Difficulty in walking, not elsewhere classified: Secondary | ICD-10-CM | POA: Diagnosis not present

## 2021-03-19 ENCOUNTER — Encounter: Payer: Self-pay | Admitting: Physician Assistant

## 2021-03-19 DIAGNOSIS — R262 Difficulty in walking, not elsewhere classified: Secondary | ICD-10-CM | POA: Diagnosis not present

## 2021-03-19 DIAGNOSIS — R29898 Other symptoms and signs involving the musculoskeletal system: Secondary | ICD-10-CM | POA: Diagnosis not present

## 2021-03-22 NOTE — Telephone Encounter (Signed)
Please call this patient to schedule an IN OFFICE visit with me. Thank you.

## 2021-03-23 ENCOUNTER — Ambulatory Visit (INDEPENDENT_AMBULATORY_CARE_PROVIDER_SITE_OTHER): Payer: Medicare Other | Admitting: Physician Assistant

## 2021-03-23 ENCOUNTER — Encounter: Payer: Self-pay | Admitting: Physician Assistant

## 2021-03-23 VITALS — BP 130/80 | HR 64

## 2021-03-23 DIAGNOSIS — I10 Essential (primary) hypertension: Secondary | ICD-10-CM | POA: Diagnosis not present

## 2021-03-23 DIAGNOSIS — G8114 Spastic hemiplegia affecting left nondominant side: Secondary | ICD-10-CM | POA: Diagnosis not present

## 2021-03-23 DIAGNOSIS — T148XXA Other injury of unspecified body region, initial encounter: Secondary | ICD-10-CM | POA: Diagnosis not present

## 2021-03-23 NOTE — Progress Notes (Signed)
? ?Acute Office Visit ? ?Subjective:  ? ? Patient ID: Steve Burton, male    DOB: 09-Jun-1955, 66 y.o.   MRN: [id number] ? ?Chief Complaint  ?Patient presents with  ? Blister  ?  Pt has 2 blister inside his left hand. Pt has left sided weakness so he is not sure if it hurts.  ? ? ?HPI ?Patient is in today for a follow up appointment, presents with his Wife who is his caregiver after CVA with left hemiparesis; patient is not able to use his hand and it often rests in his lap; is concerned about a "blood blister" on his left palm x 2 weeks; states it's actually getting better and healing now; Wife has been cleaning it with alcohol; patient has minimal feeling in that hand so there is no pain; denies trauma or fall; doesn't have to use that hand to help transfer, with wheelchair, or with a cane because that hand and arm are weak.  ? ?Outpatient Medications Prior to Visit  ?Medication Sig Dispense Refill  ? atorvastatin (LIPITOR) 10 MG tablet Take 1 tablet (10 mg total) by mouth daily. 90 tablet 1  ? baclofen (LIORESAL) 10 MG tablet TAKE 1 TABLET BY MOUTH 3 TIMES  DAILY 270 tablet 0  ? diltiazem (CARDIZEM CD) 240 MG 24 hr capsule TAKE 1 CAPSULE BY MOUTH  DAILY 90 capsule 3  ? labetalol (NORMODYNE) 100 MG tablet TAKE 1 TABLET BY MOUTH  TWICE DAILY 180 tablet 3  ? labetalol (NORMODYNE) 200 MG tablet TAKE 1 TABLET BY MOUTH  TWICE DAILY 180 tablet 3  ? levETIRAcetam (KEPPRA) 500 MG tablet TAKE 1 TABLET BY MOUTH  TWICE DAILY 180 tablet 3  ? losartan (COZAAR) 100 MG tablet TAKE 1 TABLET BY MOUTH  DAILY 90 tablet 3  ? Multiple Vitamins-Minerals (CENTRUM SILVER 50+MEN) TABS Take 1 tablet by mouth daily with lunch.    ? potassium chloride SA (KLOR-CON M) 20 MEQ tablet TAKE 2 TABLETS BY MOUTH  DAILY 120 tablet 5  ? spironolactone (ALDACTONE) 25 MG tablet TAKE 1 TABLET BY MOUTH  DAILY 90 tablet 3  ? ?No facility-administered medications prior to visit.  ? ? ?Allergies  ?Allergen Reactions  ? Phenytoin Sodium Extended Swelling and  Other (See Comments)  ?  Dilantin- Arm swelling ?  ? Penicillins Rash  ?  Has patient had a PCN reaction causing immediate rash, facial/tongue/throat swelling, SOB or lightheadedness with hypotension: Unknown ?Has patient had a PCN reaction causing severe rash involving mucus membranes or skin necrosis: Unk ?Has patient had a PCN reaction that required hospitalization: Unk ?Has patient had a PCN reaction occurring within the last 10 years: Yes ?If all of the above answers are "NO", then may proceed with Cephalosporin use. ?  ? ? ?Review of Systems  ?Constitutional:  Negative for activity change, chills, fatigue and fever.  ?HENT:  Negative for congestion, ear pain, hearing loss and voice change.   ?Eyes:  Negative for pain and redness.  ?Respiratory:  Negative for cough and shortness of breath.   ?Cardiovascular:  Negative for leg swelling.  ?Gastrointestinal:  Negative for constipation, diarrhea, nausea and vomiting.  ?Endocrine: Negative for polyuria.  ?Genitourinary:  Negative for flank pain and frequency.  ?Musculoskeletal:  Negative for joint swelling and neck pain.  ?Skin:  Positive for color change. Negative for rash and wound.  ?Neurological:  Negative for dizziness.  ?Hematological:  Does not bruise/bleed easily.  ?Psychiatric/Behavioral:  Negative for agitation and behavioral problems.   ? ?   ?  Objective:  ?  ?Physical Exam ?Vitals and nursing note reviewed.  ?Constitutional:   ?   General: He is not in acute distress. ?   Appearance: Normal appearance.  ?HENT:  ?   Head: Normocephalic and atraumatic.  ?   Right Ear: External ear normal.  ?   Left Ear: External ear normal.  ?   Nose: No congestion.  ?Eyes:  ?   Extraocular Movements: Extraocular movements intact.  ?   Conjunctiva/sclera: Conjunctivae normal.  ?   Pupils: Pupils are equal, round, and reactive to light.  ?Cardiovascular:  ?   Rate and Rhythm: Normal rate and regular rhythm.  ?   Pulses: Normal pulses.  ?   Heart sounds: Normal heart sounds.   ?Pulmonary:  ?   Effort: Pulmonary effort is normal.  ?   Breath sounds: Normal breath sounds. No wheezing.  ?Abdominal:  ?   General: Bowel sounds are normal.  ?   Palpations: Abdomen is soft.  ?Musculoskeletal:     ?   General: Normal range of motion.  ?   Right wrist: Abnormal pulse.  ?   Left wrist: Normal pulse.  ?   Right hand: Normal pulse.  ?   Left hand: Deformity present. Normal pulse.  ?   Cervical back: Normal range of motion and neck supple.  ?   Right lower leg: No edema.  ?   Left lower leg: No edema.  ?Skin: ?   General: Skin is warm and dry.  ?   Findings: Abrasion present. No rash.  ?   Comments: Left palm with circular abrasion that is healing; no surrounding erythema, swelling, tenderness, warmth, or discharge  ?Neurological:  ?   Mental Status: He is alert and oriented to person, place, and time.  ?   Gait: Gait normal.  ?Psychiatric:     ?   Mood and Affect: Mood normal.     ?   Behavior: Behavior normal.  ? ? ?BP 130/80   Pulse 64   SpO2 99%  ? ?Wt Readings from Last 3 Encounters:  ?01/27/21 176 lb (79.8 kg)  ?01/06/21 166 lb 9.6 oz (75.6 kg)  ?09/25/20 165 lb 12.8 oz (75.2 kg)  ? ? ?Results for orders placed or performed in visit on 01/06/21  ?Fecal occult blood, imunochemical([facility])  ?Result Value Ref Range  ? Fecal Occult Bld Negative Negative  ?Hemoglobin A1c  ?Result Value Ref Range  ? Hgb A1c MFr Bld 5.7 (H) 4.8 - 5.6 %  ? Est. average glucose Bld gHb Est-mCnc 117 mg/dL  ?Iron  ?Result Value Ref Range  ? Iron 84 38 - 169 ug/dL  ?Folate  ?Result Value Ref Range  ? Folate >20.0 >3.0 ng/mL  ? ? ?   ?Assessment & Plan:  ?1. Abrasion ? ?2. Left spastic hemiparesis ([place]) ? ?3. Essential hypertension ? ? ? ?No orders of the defined types were placed in this encounter. ? ? ?Return for Return as Already Scheduled. ? ?Irene Pap, PA-C ?

## 2021-03-25 ENCOUNTER — Encounter: Payer: Self-pay | Admitting: Physician Assistant

## 2021-03-25 DIAGNOSIS — Z993 Dependence on wheelchair: Secondary | ICD-10-CM | POA: Insufficient documentation

## 2021-03-25 NOTE — Assessment & Plan Note (Signed)
Stable, recommended using a band-aid to cover abrasion until healed, then may use moleskin to protect the skin from rubbing on his pants ?

## 2021-03-25 NOTE — Assessment & Plan Note (Signed)
controlled, continue cardizem, cozaar, spironolactone, eat a low salt diet, do not add any salt to food when cooking, avoid processed foods, avoid fried foods ? ?

## 2021-05-04 ENCOUNTER — Other Ambulatory Visit: Payer: Self-pay | Admitting: Family Medicine

## 2021-05-05 NOTE — Telephone Encounter (Signed)
[facility] is requesting to fill pt baclofen.please advise [place] ?

## 2021-05-08 ENCOUNTER — Encounter: Payer: Self-pay | Admitting: Physician Assistant

## 2021-05-11 NOTE — Telephone Encounter (Signed)
I will hand write a prescription. Please fax it to: ? ?[facility] ?[address] ?[place], [place] [address] ? ?[phone] (PHONE) ?[phone] Joylene Igo) ? ?

## 2021-05-17 ENCOUNTER — Ambulatory Visit: Payer: Medicare Other | Admitting: Neurology

## 2021-05-17 DIAGNOSIS — G40219 Localization-related (focal) (partial) symptomatic epilepsy and epileptic syndromes with complex partial seizures, intractable, without status epilepticus: Secondary | ICD-10-CM

## 2021-05-17 DIAGNOSIS — I610 Nontraumatic intracerebral hemorrhage in hemisphere, subcortical: Secondary | ICD-10-CM

## 2021-05-20 DIAGNOSIS — R252 Cramp and spasm: Secondary | ICD-10-CM | POA: Diagnosis not present

## 2021-05-20 DIAGNOSIS — I639 Cerebral infarction, unspecified: Secondary | ICD-10-CM | POA: Diagnosis not present

## 2021-05-20 DIAGNOSIS — I69398 Other sequelae of cerebral infarction: Secondary | ICD-10-CM | POA: Diagnosis not present

## 2021-06-01 ENCOUNTER — Telehealth: Payer: Self-pay

## 2021-06-01 NOTE — Telephone Encounter (Signed)
-----   Message from Frann Rider, NP sent at 06/01/2021  2:36 PM EDT ----- Did discuss potentially weaning off of Keppra based on these EEG results.  As these EEG results were normal, can you please contact patient to see if he would still be interested in discontinuing Keppra? He is aware no driving during weaning off and once completely off, no driving for 6 months per [place] law, but please ensure this is reiterated if he wishes to taper off.  Thank you.

## 2021-06-02 NOTE — Telephone Encounter (Signed)
Pt responded to message via my chart and would like to continue Keppra at this time. He will touch base with [place] if he changes his mind. Will keep f/u as scheduled for 01/27/2022.

## 2021-06-02 NOTE — Telephone Encounter (Signed)
Sounds good. Thank you

## 2021-07-02 ENCOUNTER — Encounter: Payer: Self-pay | Admitting: Internal Medicine

## 2021-07-05 ENCOUNTER — Other Ambulatory Visit: Payer: Self-pay | Admitting: Medical

## 2021-07-29 ENCOUNTER — Other Ambulatory Visit: Payer: Self-pay

## 2021-07-29 DIAGNOSIS — I10 Essential (primary) hypertension: Secondary | ICD-10-CM

## 2021-07-29 MED ORDER — LOSARTAN POTASSIUM 100 MG PO TABS: 100.0000 mg | ORAL_TABLET | Freq: Every day | ORAL | 1 refills | Status: DC

## 2021-08-19 ENCOUNTER — Telehealth: Payer: Self-pay

## 2021-08-19 NOTE — Telephone Encounter (Signed)
Pt mailed in disability form that needs to be completed. Pt has an appt with you on 9/25. Form placed in your folder. Copy also given to [place]

## 2021-08-23 NOTE — Telephone Encounter (Signed)
Pt was notified and I will mail paper back to him today. Pt is aware

## 2021-09-27 ENCOUNTER — Ambulatory Visit: Payer: Medicare Other | Admitting: Physician Assistant

## 2021-09-27 ENCOUNTER — Encounter: Payer: Self-pay | Admitting: Medical

## 2021-09-27 ENCOUNTER — Ambulatory Visit (INDEPENDENT_AMBULATORY_CARE_PROVIDER_SITE_OTHER): Payer: Medicare Other | Admitting: Medical

## 2021-09-27 VITALS — BP 120/72 | HR 56 | Ht 71.0 in | Wt 166.0 lb

## 2021-09-27 DIAGNOSIS — I6930 Unspecified sequelae of cerebral infarction: Secondary | ICD-10-CM | POA: Diagnosis not present

## 2021-09-27 DIAGNOSIS — H9193 Unspecified hearing loss, bilateral: Secondary | ICD-10-CM

## 2021-09-27 DIAGNOSIS — I421 Obstructive hypertrophic cardiomyopathy: Secondary | ICD-10-CM

## 2021-09-27 DIAGNOSIS — D509 Iron deficiency anemia, unspecified: Secondary | ICD-10-CM

## 2021-09-27 DIAGNOSIS — E785 Hyperlipidemia, unspecified: Secondary | ICD-10-CM | POA: Diagnosis not present

## 2021-09-27 DIAGNOSIS — R7301 Impaired fasting glucose: Secondary | ICD-10-CM | POA: Diagnosis not present

## 2021-09-27 DIAGNOSIS — Z0000 Encounter for general adult medical examination without abnormal findings: Secondary | ICD-10-CM | POA: Diagnosis not present

## 2021-09-27 DIAGNOSIS — Z993 Dependence on wheelchair: Secondary | ICD-10-CM

## 2021-09-27 DIAGNOSIS — I639 Cerebral infarction, unspecified: Secondary | ICD-10-CM | POA: Diagnosis not present

## 2021-09-27 DIAGNOSIS — I10 Essential (primary) hypertension: Secondary | ICD-10-CM | POA: Diagnosis not present

## 2021-09-27 DIAGNOSIS — I69398 Other sequelae of cerebral infarction: Secondary | ICD-10-CM | POA: Diagnosis not present

## 2021-09-27 DIAGNOSIS — G8114 Spastic hemiplegia affecting left nondominant side: Secondary | ICD-10-CM | POA: Diagnosis not present

## 2021-09-27 DIAGNOSIS — R252 Cramp and spasm: Secondary | ICD-10-CM

## 2021-09-27 NOTE — Patient Instructions (Signed)
This visit was a preventative care visit, also known as wellness visit or routine physical.   Topics typically include healthy lifestyle, diet, exercise, preventative care, vaccinations, sick and well care, proper use of emergency dept and after hours care, as well as other concerns.     Recommendations: Continue to return yearly for your annual wellness and preventative care visits.  This gives [place] a chance to discuss healthy lifestyle, exercise, vaccinations, review your chart record, and perform screenings where appropriate.  I recommend you see your eye doctor yearly for routine vision care.  I recommend you see your dentist yearly for routine dental care including hygiene visits twice yearly.   Vaccination recommendations were reviewed Immunization History  Administered Date(s) Administered   [employer] Vaccination 04/30/2019, 05/28/2019   Td 07/25/2014    Your Td is up to date I recommend a yearly flu shot I recommend Shingrix vaccine which can be done at your pharmacy I recommend the upcoming covid vaccine  He declines flu shot   Screening for cancer: Colon cancer screening: I reviewed your colonoscopy on file that is up to date from 2016, normal, repeat 2026.  We discussed PSA, prostate exam, and prostate cancer screening risks/benefits.     Skin cancer screening: Check your skin regularly for new changes, growing lesions, or other lesions of concern Come in for evaluation if you have skin lesions of concern.  Lung cancer screening: If you have a greater than 20 pack year history of tobacco use, then you may qualify for lung cancer screening with a chest CT scan.   Please call your insurance company to inquire about coverage for this test.  We currently don't have screenings for other cancers besides breast, cervical, colon, and lung cancers.  If you have a strong family history of cancer or have other cancer screening concerns, please let me know.     Bone health: Get at least 150 minutes of aerobic exercise weekly Get weight bearing exercise at least once weekly Bone density test:  A bone density test is an imaging test that uses a type of X-ray to measure the amount of calcium and other minerals in your bones. The test may be used to diagnose or screen you for a condition that causes weak or thin bones (osteoporosis), predict your risk for a broken bone (fracture), or determine how well your osteoporosis treatment is working. The bone density test is recommended for females 22 and older, or females or males <47 if certain risk factors such as thyroid disease, long term use of steroids such as for asthma or rheumatological issues, vitamin D deficiency, estrogen deficiency, family history of osteoporosis, self or family history of fragility fracture in first degree relative.  Consider base line bone density test  Please call to schedule your bone density test.   [facility] [address], [place], [place] [address]   Heart health: Get at least 150 minutes of aerobic exercise weekly Limit alcohol It is important to maintain a healthy blood pressure and healthy cholesterol numbers  Heart disease screening: Screening for heart disease includes screening for blood pressure, fasting lipids, glucose/diabetes screening, BMI height to weight ratio, reviewed of smoking status, physical activity, and diet.    Goals include blood pressure 120/80 or less, maintaining a healthy lipid/cholesterol profile, preventing diabetes or keeping diabetes numbers under good control, not smoking or using tobacco products, exercising most days per week or at least 150 minutes  per week of exercise, and eating healthy variety of fruits and vegetables, healthy oils, and avoiding unhealthy food choices like fried food, fast food, high sugar and high cholesterol foods.    Other tests may possibly include EKG  test, CT coronary calcium score, echocardiogram, exercise treadmill stress test.    Medical care options: I recommend you continue to seek care here first for routine care.  We try really hard to have available appointments Monday through Friday daytime hours for sick visits, acute visits, and physicals.  Urgent care should be used for after hours and weekends for significant issues that cannot wait till the next day.  The emergency department should be used for significant potentially life-threatening emergencies.  The emergency department is expensive, can often have long wait times for less significant concerns, so try to utilize primary care, urgent care, or telemedicine when possible to avoid unnecessary trips to the emergency department.  Virtual visits and telemedicine have been introduced since the pandemic started in 2020, and can be convenient ways to receive medical care.  We offer virtual appointments as well to assist you in a variety of options to seek medical care.    Separate significant issues discussed: Cardiomyopathy, continue routine follow-up with cardiology, compliant with medications  Hyperlipidemia-updated labs today, continue Lipitor 10 mg daily  Spasticity of muscles, status post prior stroke, uses baclofen as needed  Neurology manages his Keppra

## 2021-09-27 NOTE — Progress Notes (Signed)
Subjective:    Steve Burton is a 66 y.o. male who presents for Preventative Services visit and chronic medical problems/med check visit.    Primary Care Provider Tysinger, Camelia Eng, PA-C here for primary care  Current Health Care Team: Dentist, Dr.  Renita Papa, NP neurology Dr. Lorenda Peck, podiatry Dr. Kirk Ruths, cardiology Dr. Lavonna Monarch, dermatology Dr. Owens Loffler, [place] you may have received from other than [facility] providers in the past year (date may be approximate) Neurology- [place]   Exercise Current exercise habits:  walking with a can once a day   around the house  Nutrition/Diet Current diet: in general, a "healthy" diet    Depression Screen    09/27/2021    2:42 PM  Depression screen PHQ 2/9  Decreased Interest 0  Down, Depressed, Hopeless 0  PHQ - 2 Score 0    Activities of Daily Living Screen/Functional Status Survey Is the patient deaf or have difficulty hearing?: No Does the patient have difficulty seeing, even when wearing glasses/contacts?: No Does the patient have difficulty concentrating, remembering, or making decisions?: No Does the patient have difficulty walking or climbing stairs?: Yes (in wheelchair) Does the patient have difficulty dressing or bathing?: Yes Does the patient have difficulty doing errands alone such as visiting a doctor's office or shopping?: Yes  Can patient draw a clock face showing 3:15 oclock, yes  Fall Risk Screen    09/27/2021    2:42 PM 03/23/2021    2:24 PM 09/25/2020    9:22 AM 06/05/2019   12:59 PM 04/05/2018    8:18 AM  [place] in the past year? 0 0 0 0 0  Number falls in past yr: 0 0 0 0 0  Injury with Fall? 0 0 0 0 0  Risk for fall due to : No Fall Risks No Fall Risks Impaired mobility;Impaired balance/gait    Follow up Falls evaluation completed Falls evaluation completed Falls evaluation completed      Gait Assessment: Normal gait observed- in wheelchair  Advanced  directives Does patient have a [place]? Yes Does patient have a Living Will? Yes, almost done, awaiting notarization  Past Medical History:  Diagnosis Date   Elevated LDL cholesterol level    Elevated PSA measurement 11/22/2016   Hyperlipidemia    Hypertension    Impaired fasting glucose 11/22/2016   Left hemiparesis (HCC)    Spastic hemiparesis ([place])    Stroke ([place]) 2011    Past Surgical History:  Procedure Laterality Date   CATARACT EXTRACTION  01/04/2004   right eye   HERNIA REPAIR Right 01/04/2003   inguinal    Social History   Socioeconomic History   Marital status: Married    Spouse name: Not on file   Number of children: Not on file   Years of education: Not on file   Highest education level: Not on file  Occupational History   Not on file  Tobacco Use   Smoking status: Never   Smokeless tobacco: Never  Substance and Sexual Activity   Alcohol use: Yes    Alcohol/week: 0.0 standard drinks of alcohol    Comment: 1 glass of wine per week.    Drug use: No   Sexual activity: Not on file  Other Topics Concern   Not on file  Social History Narrative   Married, walking for exercise some. Uses cane and wheelchair.  Hobbies -  piano.  09/2021  Social Determinants of Health   Financial Resource Strain: Not on file  Food Insecurity: Not on file  Transportation Needs: Not on file  Physical Activity: Not on file  Stress: Not on file  Social Connections: Not on file  Intimate Partner Violence: Not on file    Family History  Problem Relation Age of Onset   Stroke Mother    Heart disease Father        MI at age 63   Epilepsy Sister    Colon cancer Neg Hx      Current Outpatient Medications:    atorvastatin (LIPITOR) 10 MG tablet, TAKE 1 TABLET BY MOUTH DAILY, Disp: 90 tablet, Rfl: 0   baclofen (LIORESAL) 10 MG tablet, TAKE 1 TABLET BY MOUTH 3 TIMES  DAILY, Disp: 270 tablet, Rfl: 0   diltiazem (CARDIZEM CD) 240 MG 24 hr capsule, TAKE  1 CAPSULE BY MOUTH  DAILY, Disp: 90 capsule, Rfl: 3   labetalol (NORMODYNE) 100 MG tablet, TAKE 1 TABLET BY MOUTH  TWICE DAILY, Disp: 180 tablet, Rfl: 3   labetalol (NORMODYNE) 200 MG tablet, TAKE 1 TABLET BY MOUTH  TWICE DAILY, Disp: 180 tablet, Rfl: 3   levETIRAcetam (KEPPRA) 500 MG tablet, TAKE 1 TABLET BY MOUTH  TWICE DAILY, Disp: 180 tablet, Rfl: 3   losartan (COZAAR) 100 MG tablet, Take 1 tablet (100 mg total) by mouth daily., Disp: 90 tablet, Rfl: 1   Multiple Vitamins-Minerals (CENTRUM SILVER 50+MEN) TABS, Take 1 tablet by mouth daily with lunch., Disp: , Rfl:    potassium chloride SA (KLOR-CON M) 20 MEQ tablet, TAKE 2 TABLETS BY MOUTH  DAILY, Disp: 120 tablet, Rfl: 5   spironolactone (ALDACTONE) 25 MG tablet, TAKE 1 TABLET BY MOUTH  DAILY, Disp: 90 tablet, Rfl: 3  Allergies  Allergen Reactions   Phenytoin Sodium Extended Swelling and Other (See Comments)    Dilantin- Arm swelling    Penicillins Rash    Has patient had a PCN reaction causing immediate rash, facial/tongue/throat swelling, SOB or lightheadedness with hypotension: Unknown Has patient had a PCN reaction causing severe rash involving mucus membranes or skin necrosis: Unk Has patient had a PCN reaction that required hospitalization: Unk Has patient had a PCN reaction occurring within the last 10 years: Yes If all of the above answers are "NO", then may proceed with Cephalosporin use.     History reviewed: allergies, current medications, past family history, past medical history, past social history, past surgical history and problem list  Chronic issues discussed: Hyperlipidemia-compliant with Lipitor  Sees heart doctor, compliant with labetalol losartan diltiazem and spironolactone  Sees neurology, on Keppra for stroke prevention  Uses baclofen for spasticity, history of stroke, left-sided hemiparesis  He also has a form for jury duty.  He would like a dismissal given his inability to sit for long periods and has  to have his wife to ambulate 24/7  Acute issues discussed: none  Objective:     Biometrics BP 120/72   Pulse (!) 56   Ht '5\' 11"'$  (1.803 m)   Wt 166 lb (75.3 kg)   BMI 23.15 kg/m   Gen: wd, wn ,nad, seated in wheelchair Skin: scaling throughout scalp HEENT: normocephalic, sclerae anicteric, TMs pearly, nares patent, no discharge or erythema, pharynx normal Oral cavity: MMM, no lesions Neck: supple, no lymphadenopathy, no thyromegaly, no masses, no bruits Heart: RRR, normal S1, S2, no murmurs Lungs: CTA bilaterally, no wheezes, rhonchi, or rales Abdomen: +bs, soft, non tender, non distended, no masses,  no hepatomegaly, no splenomegaly Musculoskeletal: nontender, no swelling, no obvious deformity Extremities: no edema, no cyanosis, no clubbing Pulses: 2+ symmetric, upper and lower extremities, normal cap refill Neurological: alert, oriented x 3, +spasticity left  arm and left leg, but left leg with some decent strength exctep foot, otherwise CN2-12 intact, strength normal upper extremities and lower extremities, sensation normal throughout, DTRs 2+ throughout, no cerebellar signs, gait normal Psychiatric: normal affect, behavior normal, pleasant  GU/rectal - deferred/declined    Assessment:   Encounter Diagnoses  Name Primary?   Encounter for health maintenance examination in adult Yes   Medicare annual wellness visit, subsequent    Wheelchair dependence    Spasticity due to old stroke    Left-sided cerebrovascular accident (CVA) ([place])    Left spastic hemiparesis ([place])    Iron deficiency anemia, unspecified iron deficiency anemia type    Impaired fasting glucose    Hypertrophic obstructive cardiomyopathy (HCC)    Hyperlipidemia, unspecified hyperlipidemia type    History of hemorrhagic cerebrovascular accident (CVA) with residual deficit    Essential hypertension    Decreased hearing, bilateral      Plan:    This visit was a preventative care visit, also known as  wellness visit or routine physical.   Topics typically include healthy lifestyle, diet, exercise, preventative care, vaccinations, sick and well care, proper use of emergency dept and after hours care, as well as other concerns.     Recommendations: Continue to return yearly for your annual wellness and preventative care visits.  This gives [place] a chance to discuss healthy lifestyle, exercise, vaccinations, review your chart record, and perform screenings where appropriate.  I recommend you see your eye doctor yearly for routine vision care.  I recommend you see your dentist yearly for routine dental care including hygiene visits twice yearly.   Vaccination recommendations were reviewed Immunization History  Administered Date(s) Administered   [employer] Vaccination 04/30/2019, 05/28/2019   Td 07/25/2014    Your Td is up to date I recommend a yearly flu shot I recommend Shingrix vaccine which can be done at your pharmacy I recommend the upcoming covid vaccine  He declines flu shot   Screening for cancer: Colon cancer screening: I reviewed your colonoscopy on file that is up to date from 2016, normal, repeat 2026.  We discussed PSA, prostate exam, and prostate cancer screening risks/benefits.     Skin cancer screening: Check your skin regularly for new changes, growing lesions, or other lesions of concern Come in for evaluation if you have skin lesions of concern.  Lung cancer screening: If you have a greater than 20 pack year history of tobacco use, then you may qualify for lung cancer screening with a chest CT scan.   Please call your insurance company to inquire about coverage for this test.  We currently don't have screenings for other cancers besides breast, cervical, colon, and lung cancers.  If you have a strong family history of cancer or have other cancer screening concerns, please let me know.    Bone health: Get at least 150 minutes of aerobic exercise  weekly Get weight bearing exercise at least once weekly Bone density test:  A bone density test is an imaging test that uses a type of X-ray to measure the amount of calcium and other minerals in your bones. The test may be used to diagnose or screen you for a condition that causes weak or thin bones (osteoporosis), predict your risk for a broken bone (  fracture), or determine how well your osteoporosis treatment is working. The bone density test is recommended for females 11 and older, or females or males <38 if certain risk factors such as thyroid disease, long term use of steroids such as for asthma or rheumatological issues, vitamin D deficiency, estrogen deficiency, family history of osteoporosis, self or family history of fragility fracture in first degree relative.  Consider base line bone density test  Please call to schedule your bone density test.   [facility] [address] [place], [place] [address]   Heart health: Get at least 150 minutes of aerobic exercise weekly Limit alcohol It is important to maintain a healthy blood pressure and healthy cholesterol numbers  Heart disease screening: Screening for heart disease includes screening for blood pressure, fasting lipids, glucose/diabetes screening, BMI height to weight ratio, reviewed of smoking status, physical activity, and diet.    Goals include blood pressure 120/80 or less, maintaining a healthy lipid/cholesterol profile, preventing diabetes or keeping diabetes numbers under good control, not smoking or using tobacco products, exercising most days per week or at least 150 minutes per week of exercise, and eating healthy variety of fruits and vegetables, healthy oils, and avoiding unhealthy food choices like fried food, fast food, high sugar and high cholesterol foods.    Other tests may possibly include EKG test, CT coronary calcium score, echocardiogram, exercise  treadmill stress test.    Medical care options: I recommend you continue to seek care here first for routine care.  We try really hard to have available appointments Monday through Friday daytime hours for sick visits, acute visits, and physicals.  Urgent care should be used for after hours and weekends for significant issues that cannot wait till the next day.  The emergency department should be used for significant potentially life-threatening emergencies.  The emergency department is expensive, can often have long wait times for less significant concerns, so try to utilize primary care, urgent care, or telemedicine when possible to avoid unnecessary trips to the emergency department.  Virtual visits and telemedicine have been introduced since the pandemic started in 2020, and can be convenient ways to receive medical care.  We offer virtual appointments as well to assist you in a variety of options to seek medical care.    Separate significant issues discussed: Cardiomyopathy, continue routine follow-up with cardiology, compliant with medications  Hyperlipidemia-updated labs today, continue Lipitor 10 mg daily  Spasticity of muscles, status post prior stroke, uses baclofen as needed  Neurology manages his Keppra   Brevon was seen today for fasting cpe.  Diagnoses and all orders for this visit:  Encounter for health maintenance examination in adult -     DG Bone Density; Future -     Comprehensive metabolic panel -     CBC with Differential/Platelet -     Lipid panel -     Hemoglobin A1c  Medicare annual wellness visit, subsequent  Wheelchair dependence  Spasticity due to old stroke  Left-sided cerebrovascular accident (CVA) ([place])  Left spastic hemiparesis (HCC)  Iron deficiency anemia, unspecified iron deficiency anemia type  Impaired fasting glucose -     Hemoglobin A1c  Hypertrophic obstructive cardiomyopathy (HCC)  Hyperlipidemia, unspecified hyperlipidemia type -      Lipid panel  History of hemorrhagic cerebrovascular accident (CVA) with residual deficit  Essential hypertension  Decreased hearing, bilateral   Follow-up pending labs, yearly for physical    Medicare Attestation A  preventative services visit was completed today.  During the course of the visit the patient was educated and counseled about appropriate screening and preventive services.  A health risk assessment was established with the patient that included a review of current medications, allergies, social history, family history, medical and preventative health history, biometrics, and preventative screenings to identify potential safety concerns or impairments.  A personalized plan was printed today for the patient's records and use.   Personalized health advice and education was given today to reduce health risks and promote self management and wellness.  Information regarding end of life planning was discussed today.  Dorothea Ogle, PA-C   09/27/2021

## 2021-09-28 ENCOUNTER — Other Ambulatory Visit: Payer: Self-pay | Admitting: Physician Assistant

## 2021-09-28 LAB — COMPREHENSIVE METABOLIC PANEL
ALT: 17 IU/L (ref 0–44)
AST: 16 IU/L (ref 0–40)
Albumin/Globulin Ratio: 2.3 — ABNORMAL HIGH (ref 1.2–2.2)
Albumin: 4.6 g/dL (ref 3.9–4.9)
Alkaline Phosphatase: 111 IU/L (ref 44–121)
BUN/Creatinine Ratio: 14 (ref 10–24)
BUN: 17 mg/dL (ref 8–27)
Bilirubin Total: 0.7 mg/dL (ref 0.0–1.2)
CO2: 30 mmol/L — ABNORMAL HIGH (ref 20–29)
Calcium: 9.9 mg/dL (ref 8.6–10.2)
Chloride: 100 mmol/L (ref 96–106)
Creatinine, Ser: 1.19 mg/dL (ref 0.76–1.27)
Globulin, Total: 2 g/dL (ref 1.5–4.5)
Glucose: 99 mg/dL (ref 70–99)
Potassium: 3.9 mmol/L (ref 3.5–5.2)
Sodium: 145 mmol/L — ABNORMAL HIGH (ref 134–144)
Total Protein: 6.6 g/dL (ref 6.0–8.5)
eGFR: 68 mL/min/{1.73_m2} (ref >59)

## 2021-09-28 LAB — HEMOGLOBIN A1C
Est. average glucose Bld gHb Est-mCnc: 117 mg/dL
Hgb A1c MFr Bld: 5.7 % — ABNORMAL HIGH (ref 4.8–5.6)

## 2021-09-28 LAB — LIPID PANEL
Chol/HDL Ratio: 3.1 ratio (ref 0.0–5.0)
Cholesterol, Total: 173 mg/dL (ref 100–199)
HDL: 55 mg/dL (ref >39)
LDL Chol Calc (NIH): 103 mg/dL — ABNORMAL HIGH (ref 0–99)
Triglycerides: 81 mg/dL (ref 0–149)
VLDL Cholesterol Cal: 15 mg/dL (ref 5–40)

## 2021-09-28 LAB — CBC WITH DIFFERENTIAL/PLATELET
Basophils Absolute: 0 10*3/uL (ref 0.0–0.2)
Basos: 1 %
EOS (ABSOLUTE): 0.3 10*3/uL (ref 0.0–0.4)
Eos: 4 %
Hematocrit: 38.4 % (ref 37.5–51.0)
Hemoglobin: 12.9 g/dL — ABNORMAL LOW (ref 13.0–17.7)
Immature Grans (Abs): 0 10*3/uL (ref 0.0–0.1)
Immature Granulocytes: 0 %
Lymphocytes Absolute: 1.9 10*3/uL (ref 0.7–3.1)
Lymphs: 27 %
MCH: 28.9 pg (ref 26.6–33.0)
MCHC: 33.6 g/dL (ref 31.5–35.7)
MCV: 86 fL (ref 79–97)
Monocytes Absolute: 0.5 10*3/uL (ref 0.1–0.9)
Monocytes: 8 %
Neutrophils Absolute: 4.2 10*3/uL (ref 1.4–7.0)
Neutrophils: 60 %
Platelets: 186 10*3/uL (ref 150–450)
RBC: 4.47 x10E6/uL (ref 4.14–5.80)
RDW: 13.1 % (ref 11.6–15.4)
WBC: 7 10*3/uL (ref 3.4–10.8)

## 2021-09-29 ENCOUNTER — Other Ambulatory Visit: Payer: Self-pay | Admitting: Medical

## 2021-09-29 MED ORDER — BACLOFEN 10 MG PO TABS: 10.0000 mg | ORAL_TABLET | Freq: Three times a day (TID) | ORAL | 0 refills | Status: DC

## 2021-10-01 ENCOUNTER — Other Ambulatory Visit: Payer: Self-pay | Admitting: Cardiology

## 2021-10-01 DIAGNOSIS — I10 Essential (primary) hypertension: Secondary | ICD-10-CM

## 2021-10-02 ENCOUNTER — Other Ambulatory Visit: Payer: Self-pay | Admitting: Adult Health

## 2021-10-02 ENCOUNTER — Other Ambulatory Visit: Payer: Self-pay | Admitting: Medical

## 2021-10-02 ENCOUNTER — Other Ambulatory Visit: Payer: Self-pay | Admitting: Cardiology

## 2021-10-02 DIAGNOSIS — I10 Essential (primary) hypertension: Secondary | ICD-10-CM

## 2021-10-07 ENCOUNTER — Telehealth: Payer: Self-pay | Admitting: Medical

## 2021-10-07 NOTE — Telephone Encounter (Signed)
Noted  

## 2021-10-07 NOTE — Telephone Encounter (Signed)
Just want to let you know, pt called back and I scheduled him for his med check.

## 2021-10-12 ENCOUNTER — Encounter: Payer: Self-pay | Admitting: Internal Medicine

## 2021-11-11 ENCOUNTER — Inpatient Hospital Stay (HOSPITAL_COMMUNITY): Payer: Medicare Other

## 2021-11-11 ENCOUNTER — Emergency Department (HOSPITAL_COMMUNITY): Payer: Medicare Other

## 2021-11-11 ENCOUNTER — Telehealth (INDEPENDENT_AMBULATORY_CARE_PROVIDER_SITE_OTHER): Payer: Medicare Other | Admitting: Medical

## 2021-11-11 ENCOUNTER — Encounter (HOSPITAL_COMMUNITY): Payer: Self-pay

## 2021-11-11 ENCOUNTER — Inpatient Hospital Stay (HOSPITAL_COMMUNITY)
Admission: EM | Admit: 2021-11-11 | Discharge: 2021-11-17 | DRG: 177 | Disposition: A | Discharge status: Home Health | Payer: Medicare Other | Attending: Internal Medicine | Admitting: Internal Medicine

## 2021-11-11 VITALS — Temp 102.2°F

## 2021-11-11 DIAGNOSIS — E860 Dehydration: Secondary | ICD-10-CM | POA: Diagnosis not present

## 2021-11-11 DIAGNOSIS — Z6821 Body mass index (BMI) 21.0-21.9, adult: Secondary | ICD-10-CM | POA: Diagnosis not present

## 2021-11-11 DIAGNOSIS — J929 Pleural plaque without asbestos: Secondary | ICD-10-CM | POA: Diagnosis not present

## 2021-11-11 DIAGNOSIS — G9341 Metabolic encephalopathy: Secondary | ICD-10-CM | POA: Insufficient documentation

## 2021-11-11 DIAGNOSIS — Z820 Family history of epilepsy and other diseases of the nervous system: Secondary | ICD-10-CM

## 2021-11-11 DIAGNOSIS — I69398 Other sequelae of cerebral infarction: Secondary | ICD-10-CM

## 2021-11-11 DIAGNOSIS — R509 Fever, unspecified: Secondary | ICD-10-CM | POA: Diagnosis not present

## 2021-11-11 DIAGNOSIS — R7401 Elevation of levels of liver transaminase levels: Secondary | ICD-10-CM | POA: Diagnosis not present

## 2021-11-11 DIAGNOSIS — U071 COVID-19: Principal | ICD-10-CM | POA: Diagnosis present

## 2021-11-11 DIAGNOSIS — E785 Hyperlipidemia, unspecified: Secondary | ICD-10-CM | POA: Diagnosis present

## 2021-11-11 DIAGNOSIS — R918 Other nonspecific abnormal finding of lung field: Secondary | ICD-10-CM | POA: Diagnosis not present

## 2021-11-11 DIAGNOSIS — R252 Cramp and spasm: Secondary | ICD-10-CM | POA: Diagnosis not present

## 2021-11-11 DIAGNOSIS — I10 Essential (primary) hypertension: Secondary | ICD-10-CM | POA: Diagnosis present

## 2021-11-11 DIAGNOSIS — G8194 Hemiplegia, unspecified affecting left nondominant side: Secondary | ICD-10-CM | POA: Diagnosis present

## 2021-11-11 DIAGNOSIS — Z8249 Family history of ischemic heart disease and other diseases of the circulatory system: Secondary | ICD-10-CM

## 2021-11-11 DIAGNOSIS — D6959 Other secondary thrombocytopenia: Secondary | ICD-10-CM | POA: Diagnosis present

## 2021-11-11 DIAGNOSIS — G9389 Other specified disorders of brain: Secondary | ICD-10-CM | POA: Diagnosis not present

## 2021-11-11 DIAGNOSIS — Z8679 Personal history of other diseases of the circulatory system: Secondary | ICD-10-CM | POA: Diagnosis not present

## 2021-11-11 DIAGNOSIS — J9811 Atelectasis: Secondary | ICD-10-CM | POA: Diagnosis not present

## 2021-11-11 DIAGNOSIS — J479 Bronchiectasis, uncomplicated: Secondary | ICD-10-CM | POA: Diagnosis not present

## 2021-11-11 DIAGNOSIS — R0902 Hypoxemia: Secondary | ICD-10-CM

## 2021-11-11 DIAGNOSIS — I69254 Hemiplegia and hemiparesis following other nontraumatic intracranial hemorrhage affecting left non-dominant side: Secondary | ICD-10-CM | POA: Diagnosis not present

## 2021-11-11 DIAGNOSIS — Z823 Family history of stroke: Secondary | ICD-10-CM | POA: Diagnosis not present

## 2021-11-11 DIAGNOSIS — G8114 Spastic hemiplegia affecting left nondominant side: Secondary | ICD-10-CM | POA: Diagnosis not present

## 2021-11-11 DIAGNOSIS — I63312 Cerebral infarction due to thrombosis of left middle cerebral artery: Secondary | ICD-10-CM | POA: Diagnosis not present

## 2021-11-11 DIAGNOSIS — Z20822 Contact with and (suspected) exposure to covid-19: Secondary | ICD-10-CM | POA: Diagnosis not present

## 2021-11-11 DIAGNOSIS — G40909 Epilepsy, unspecified, not intractable, without status epilepticus: Secondary | ICD-10-CM | POA: Diagnosis present

## 2021-11-11 DIAGNOSIS — D649 Anemia, unspecified: Secondary | ICD-10-CM | POA: Diagnosis not present

## 2021-11-11 DIAGNOSIS — N39498 Other specified urinary incontinence: Secondary | ICD-10-CM

## 2021-11-11 DIAGNOSIS — J9601 Acute respiratory failure with hypoxia: Secondary | ICD-10-CM | POA: Diagnosis not present

## 2021-11-11 DIAGNOSIS — R32 Unspecified urinary incontinence: Secondary | ICD-10-CM | POA: Diagnosis present

## 2021-11-11 DIAGNOSIS — Z743 Need for continuous supervision: Secondary | ICD-10-CM | POA: Diagnosis not present

## 2021-11-11 DIAGNOSIS — I6389 Other cerebral infarction: Secondary | ICD-10-CM | POA: Diagnosis not present

## 2021-11-11 DIAGNOSIS — E876 Hypokalemia: Secondary | ICD-10-CM | POA: Insufficient documentation

## 2021-11-11 DIAGNOSIS — R0981 Nasal congestion: Secondary | ICD-10-CM

## 2021-11-11 DIAGNOSIS — Z888 Allergy status to other drugs, medicaments and biological substances status: Secondary | ICD-10-CM

## 2021-11-11 DIAGNOSIS — I6381 Other cerebral infarction due to occlusion or stenosis of small artery: Secondary | ICD-10-CM | POA: Diagnosis not present

## 2021-11-11 DIAGNOSIS — T699XXA Effect of reduced temperature, unspecified, initial encounter: Secondary | ICD-10-CM | POA: Diagnosis not present

## 2021-11-11 DIAGNOSIS — R6889 Other general symptoms and signs: Secondary | ICD-10-CM | POA: Diagnosis not present

## 2021-11-11 DIAGNOSIS — Z993 Dependence on wheelchair: Secondary | ICD-10-CM

## 2021-11-11 DIAGNOSIS — I639 Cerebral infarction, unspecified: Secondary | ICD-10-CM | POA: Diagnosis not present

## 2021-11-11 DIAGNOSIS — I6930 Unspecified sequelae of cerebral infarction: Secondary | ICD-10-CM

## 2021-11-11 DIAGNOSIS — Z880 Allergy status to penicillin: Secondary | ICD-10-CM

## 2021-11-11 DIAGNOSIS — R5383 Other fatigue: Secondary | ICD-10-CM | POA: Diagnosis not present

## 2021-11-11 DIAGNOSIS — R627 Adult failure to thrive: Secondary | ICD-10-CM | POA: Diagnosis present

## 2021-11-11 DIAGNOSIS — R0689 Other abnormalities of breathing: Secondary | ICD-10-CM | POA: Diagnosis not present

## 2021-11-11 DIAGNOSIS — R410 Disorientation, unspecified: Secondary | ICD-10-CM | POA: Diagnosis not present

## 2021-11-11 DIAGNOSIS — Z87898 Personal history of other specified conditions: Secondary | ICD-10-CM | POA: Diagnosis not present

## 2021-11-11 DIAGNOSIS — N179 Acute kidney failure, unspecified: Secondary | ICD-10-CM | POA: Insufficient documentation

## 2021-11-11 DIAGNOSIS — Z79899 Other long term (current) drug therapy: Secondary | ICD-10-CM

## 2021-11-11 DIAGNOSIS — D696 Thrombocytopenia, unspecified: Secondary | ICD-10-CM | POA: Insufficient documentation

## 2021-11-11 LAB — CBC WITH DIFFERENTIAL/PLATELET
Abs Immature Granulocytes: 0.02 10*3/uL (ref 0.00–0.07)
Basophils Absolute: 0 10*3/uL (ref 0.0–0.1)
Basophils Relative: 0 %
Eosinophils Absolute: 0 10*3/uL (ref 0.0–0.5)
Eosinophils Relative: 0 %
HCT: 34 % — ABNORMAL LOW (ref 39.0–52.0)
Hemoglobin: 11.6 g/dL — ABNORMAL LOW (ref 13.0–17.0)
Immature Granulocytes: 1 %
Lymphocytes Relative: 17 %
Lymphs Abs: 0.7 10*3/uL (ref 0.7–4.0)
MCH: 29.3 pg (ref 26.0–34.0)
MCHC: 34.1 g/dL (ref 30.0–36.0)
MCV: 85.9 fL (ref 80.0–100.0)
Monocytes Absolute: 0.7 10*3/uL (ref 0.1–1.0)
Monocytes Relative: 18 %
Neutro Abs: 2.6 10*3/uL (ref 1.7–7.7)
Neutrophils Relative %: 64 %
Platelets: 134 10*3/uL — ABNORMAL LOW (ref 150–400)
RBC: 3.96 MIL/uL — ABNORMAL LOW (ref 4.22–5.81)
RDW: 13.2 % (ref 11.5–15.5)
WBC: 4.1 10*3/uL (ref 4.0–10.5)
nRBC: 0 % (ref 0.0–0.2)

## 2021-11-11 LAB — RESP PANEL BY RT-PCR (FLU A&B, COVID) ARPGX2
Influenza A by PCR: NEGATIVE
Influenza B by PCR: NEGATIVE
SARS Coronavirus 2 by RT PCR: POSITIVE — AB

## 2021-11-11 LAB — COMPREHENSIVE METABOLIC PANEL
ALT: 32 U/L (ref 0–44)
AST: 78 U/L — ABNORMAL HIGH (ref 15–41)
Albumin: 3.5 g/dL (ref 3.5–5.0)
Alkaline Phosphatase: 66 U/L (ref 38–126)
Anion gap: 12 (ref 5–15)
BUN: 20 mg/dL (ref 8–23)
CO2: 29 mmol/L (ref 22–32)
Calcium: 8.7 mg/dL — ABNORMAL LOW (ref 8.9–10.3)
Chloride: 99 mmol/L (ref 98–111)
Creatinine, Ser: 1.47 mg/dL — ABNORMAL HIGH (ref 0.61–1.24)
GFR, Estimated: 53 mL/min — ABNORMAL LOW (ref >60)
Glucose, Bld: 109 mg/dL — ABNORMAL HIGH (ref 70–99)
Potassium: 2.7 mmol/L — CL (ref 3.5–5.1)
Sodium: 140 mmol/L (ref 135–145)
Total Bilirubin: 1.1 mg/dL (ref 0.3–1.2)
Total Protein: 5.9 g/dL — ABNORMAL LOW (ref 6.5–8.1)

## 2021-11-11 LAB — MAGNESIUM: Magnesium: 2.1 mg/dL (ref 1.7–2.4)

## 2021-11-11 LAB — CBG MONITORING, ED: Glucose-Capillary: 116 mg/dL — ABNORMAL HIGH (ref 70–99)

## 2021-11-11 MED ORDER — ENOXAPARIN SODIUM 40 MG/0.4ML IJ SOSY
40.0000 mg | PREFILLED_SYRINGE | INTRAMUSCULAR | Status: DC
  Administered 2021-11-12 – 2021-11-16 (×6): 40 mg via SUBCUTANEOUS
  Filled 2021-11-11 (×5): qty 0.4

## 2021-11-11 MED ORDER — MOLNUPIRAVIR EUA 200MG CAPSULE: 4.0000 | ORAL_CAPSULE | Freq: Two times a day (BID) | ORAL | 0 refills | Status: DC

## 2021-11-11 MED ORDER — POTASSIUM CHLORIDE 10 MEQ/100ML IV SOLN
10.0000 meq | INTRAVENOUS | Status: AC
  Administered 2021-11-11 (×3): 10 meq via INTRAVENOUS
  Filled 2021-11-11 (×3): qty 100

## 2021-11-11 MED ORDER — MOLNUPIRAVIR EUA 200MG CAPSULE
4.0000 | ORAL_CAPSULE | Freq: Two times a day (BID) | ORAL | Status: DC
  Administered 2021-11-12 – 2021-11-16 (×8): 800 mg via ORAL
  Filled 2021-11-11 (×3): qty 4

## 2021-11-11 MED ORDER — ACETAMINOPHEN 500 MG PO TABS
1000.0000 mg | ORAL_TABLET | Freq: Once | ORAL | Status: AC
  Administered 2021-11-11: 1000 mg via ORAL
  Filled 2021-11-11: qty 2

## 2021-11-11 MED ORDER — POTASSIUM CHLORIDE 20 MEQ PO PACK
40.0000 meq | PACK | Freq: Two times a day (BID) | ORAL | Status: DC
  Administered 2021-11-11: 40 meq via ORAL
  Filled 2021-11-11: qty 2

## 2021-11-11 MED ORDER — POTASSIUM CHLORIDE CRYS ER 20 MEQ PO TBCR
40.0000 meq | EXTENDED_RELEASE_TABLET | Freq: Once | ORAL | Status: DC
  Filled 2021-11-11: qty 2

## 2021-11-11 MED ORDER — SODIUM CHLORIDE 0.9 % IV SOLN
1000.0000 mL | INTRAVENOUS | Status: DC
  Administered 2021-11-11 – 2021-11-12 (×4): 1000 mL via INTRAVENOUS

## 2021-11-11 MED ORDER — DEXAMETHASONE SODIUM PHOSPHATE 10 MG/ML IJ SOLN
6.0000 mg | INTRAMUSCULAR | Status: DC
  Administered 2021-11-12 – 2021-11-14 (×4): 6 mg via INTRAVENOUS
  Filled 2021-11-11 (×4): qty 1

## 2021-11-11 MED ORDER — ACETAMINOPHEN 325 MG PO TABS
650.0000 mg | ORAL_TABLET | Freq: Four times a day (QID) | ORAL | Status: DC | PRN
  Administered 2021-11-16: 650 mg via ORAL
  Filled 2021-11-11: qty 2

## 2021-11-11 NOTE — ED Provider Notes (Signed)
  Physical Exam  BP 134/67   Pulse 66   Temp (!) 101.6 F (38.7 C) (Oral)   Resp 19   Ht 6' (1.829 m)   Wt 72.6 kg   SpO2 93%   BMI 21.70 kg/m   Physical Exam  Procedures  Procedures  ED Course / MDM    Medical Decision Making Care assumed at 3 PM.  Patient is here with worsening confusion.  Patient has family that is tested positive for COVID.  Patient saw PCP today and was noted to be confused.  He was sent in for further evaluation.  Signed out pending labs and COVID test\  5 pm Patient's labs show potassium of 2.7.  He is on potassium supplementation but has not been taking it.  Noticed that he has left-sided weakness which was present previously.  He is also very confused so we will get a CT head.  8:35 PM CT head showed new area of hypodensity about the left occipital horn.  MRI brain was ordered.  Patient started desatting to about 89% on room air.  I placed patient on 2 L nasal cannula.  At this point, patient has hypoxia and also AKI and hypokalemia and possible stroke so patient will likely need admission.  I discussed with pharmacy and will place patient on molnupiravir for now.  CRITICAL CARE Performed by: Wandra Arthurs   Total critical care time: 30 minutes  Critical care time was exclusive of separately billable procedures and treating other patients.  Critical care was necessary to treat or prevent imminent or life-threatening deterioration.  Critical care was time spent personally by me on the following activities: development of treatment plan with patient and/or surrogate as well as nursing, discussions with consultants, evaluation of patient's response to treatment, examination of patient, obtaining history from patient or surrogate, ordering and performing treatments and interventions, ordering and review of laboratory studies, ordering and review of radiographic studies, pulse oximetry and re-evaluation of patient's condition.    Problems  Addressed: COVID: acute illness or injury Dehydration: acute illness or injury Hypoxia: acute illness or injury  Amount and/or Complexity of Data Reviewed Labs: ordered. Decision-making details documented in ED Course. Radiology: ordered and independent interpretation performed. Decision-making details documented in ED Course.  Risk OTC drugs. Prescription drug management.          Drenda Freeze, MD 11/11/21 2036

## 2021-11-11 NOTE — ED Triage Notes (Signed)
Patient arrived to the ED via EMS. Patient has a history of a stroke with left sided deficits. Patient's wife called EMS because she noticed confusion. Patient wife states he was holding a cup and started shaking and couldn't take a drink. Patient states he has been peeing in the bed and the wife is concerned. Patient is alert.

## 2021-11-11 NOTE — ED Notes (Signed)
Condom catheter placed for urine collection.

## 2021-11-11 NOTE — Progress Notes (Signed)
Pt is being sent to ambulance to ER

## 2021-11-11 NOTE — ED Provider Notes (Signed)
[facility] EMERGENCY DEPARTMENT Provider Note   CSN: [id number] Arrival date & time: 11/11/21  1406     History  Chief Complaint  Patient presents with   Altered Mental Status    Steve Burton is a 66 y.o. male.  Pt is a 66 yo male with a pmhx significant for CVA, HTN, HLD.  Pt's wife has covid.  Pt has had increased confusion.  He was incontinent which is not normal for pt.  Pt did have a video visit with his pcp this am.  His pcp did call in [place], but encouraged pt to be seen.  Pt denied any pain.         Home Medications Prior to Admission medications   Medication Sig Start Date End Date Taking? Authorizing Provider  atorvastatin (LIPITOR) 10 MG tablet TAKE 1 TABLET BY MOUTH DAILY 09/28/21   Tysinger, Camelia Eng, PA-C  baclofen (LIORESAL) 10 MG tablet Take 1 tablet (10 mg total) by mouth 3 (three) times daily. 09/29/21   Tysinger, Camelia Eng, PA-C  diltiazem (CARDIZEM CD) 240 MG 24 hr capsule TAKE 1 CAPSULE BY MOUTH DAILY 10/04/21   Lelon Perla, MD  labetalol (NORMODYNE) 100 MG tablet TAKE 1 TABLET BY MOUTH TWICE  DAILY 10/04/21   Lelon Perla, MD  labetalol (NORMODYNE) 200 MG tablet TAKE 1 TABLET BY MOUTH TWICE  DAILY 10/04/21   Lelon Perla, MD  levETIRAcetam (KEPPRA) 500 MG tablet TAKE 1 TABLET BY MOUTH TWICE  DAILY 10/04/21   Frann Rider, NP  losartan (COZAAR) 100 MG tablet TAKE 1 TABLET BY MOUTH DAILY 10/04/21   Tysinger, Camelia Eng, PA-C  molnupiravir EUA (LAGEVRIO) 200 mg CAPS capsule Take 4 capsules (800 mg total) by mouth 2 (two) times daily for 5 days. 11/11/21 11/16/21  Tysinger, Camelia Eng, PA-C  Multiple Vitamins-Minerals (CENTRUM SILVER 50+MEN) TABS Take 1 tablet by mouth daily with lunch.    [provider]  potassium chloride SA (KLOR-CON M) 20 MEQ tablet TAKE 2 TABLETS BY MOUTH  DAILY 03/08/21   Lelon Perla, MD  spironolactone (ALDACTONE) 25 MG tablet TAKE 1 TABLET BY MOUTH DAILY 10/04/21   Lelon Perla, MD       Allergies    Phenytoin sodium extended and Penicillins    Review of Systems   Review of Systems  Respiratory:  Positive for cough.   All other systems reviewed and are negative.   Physical Exam Updated Vital Signs BP (!) 146/79 (BP Location: Right Arm)   Pulse 69   Temp 99 F (37.2 C) (Oral)   Resp 19   Ht 6' (1.829 m)   Wt 72.6 kg   SpO2 96%   BMI 21.70 kg/m  Physical Exam Vitals and nursing note reviewed.  Constitutional:      Appearance: Normal appearance.  HENT:     Head: Normocephalic and atraumatic.     Right Ear: External ear normal.     Left Ear: External ear normal.     Nose: Nose normal.     Mouth/Throat:     Mouth: Mucous membranes are dry.  Eyes:     Extraocular Movements: Extraocular movements intact.     Conjunctiva/sclera: Conjunctivae normal.     Pupils: Pupils are equal, round, and reactive to light.  Cardiovascular:     Rate and Rhythm: Normal rate and regular rhythm.     Pulses: Normal pulses.     Heart sounds: Normal heart sounds.  Pulmonary:  Effort: Pulmonary effort is normal.     Breath sounds: Normal breath sounds.  Abdominal:     General: Abdomen is flat. Bowel sounds are normal.     Palpations: Abdomen is soft.  Musculoskeletal:        General: Normal range of motion.     Cervical back: Normal range of motion and neck supple.  Skin:    General: Skin is warm.     Capillary Refill: Capillary refill takes less than 2 seconds.  Neurological:     Mental Status: He is alert and oriented to person, place, and time.     Comments: Left arm/leg paralysis (chronic)  Psychiatric:        Mood and Affect: Mood normal.     ED Results / Procedures / Treatments   Labs (all labs ordered are listed, but only abnormal results are displayed) Labs Reviewed  CBC WITH DIFFERENTIAL/PLATELET - Abnormal; Notable for the following components:      Result Value   RBC 3.96 (*)    Hemoglobin 11.6 (*)    HCT 34.0 (*)    Platelets 134 (*)    All  other components within normal limits  CBG MONITORING, ED - Abnormal; Notable for the following components:   Glucose-Capillary 116 (*)    All other components within normal limits  RESP PANEL BY RT-PCR (FLU A&B, COVID) [id number]  COMPREHENSIVE METABOLIC PANEL  URINALYSIS, ROUTINE W REFLEX MICROSCOPIC    EKG None  Radiology DG Chest Portable 1 View  Result Date: 11/11/2021 CLINICAL DATA:  , History of stroke and LEFT side deficits, altered mental status EXAM: PORTABLE CHEST 1 VIEW COMPARISON:  Portable exam 1427 hours compared to 04/20/2019 FINDINGS: Upper normal heart size. Mediastinal contours and pulmonary vascularity normal. Minimal RIGHT basilar atelectasis. Lungs otherwise clear. No infiltrate, pleural effusion, or pneumothorax. Bones demineralized. IMPRESSION: Minimal RIGHT basilar atelectasis. Electronically Signed   By: Lavonia Dana M.D.   On: 11/11/2021 14:43    Procedures Procedures    Medications Ordered in ED Medications  0.9 %  sodium chloride infusion (has no administration in time range)    ED Course/ Medical Decision Making/ A&P                           Medical Decision Making Amount and/or Complexity of Data Reviewed Labs: ordered. Radiology: ordered.   This patient presents to the ED for concern of ams, this involves an extensive number of treatment options, and is a complaint that carries with it a high risk of complications and morbidity.  The differential diagnosis includes covid, other infection, electrolyte abn   Co morbidities that complicate the patient evaluation  CVA, HTN, HLD   Additional history obtained:  Additional history obtained from epic chart review External records from outside source obtained and reviewed including EMS report   Lab Tests:  I Ordered, and personally interpreted labs.  The pertinent results include:  cbc with hgb 11.6 (chronic)   Imaging Studies ordered:  I ordered imaging studies including cxr  I  independently visualized and interpreted imaging which showed  IMPRESSION:  Minimal RIGHT basilar atelectasis.   I agree with the radiologist interpretation   Cardiac Monitoring:  The patient was maintained on a cardiac monitor.  I personally viewed and interpreted the cardiac monitored which showed an underlying rhythm of: nsr   Medicines ordered and prescription drug management:  I ordered medication including ivfs  for dehydration  Reevaluation of  the patient after these medicines showed that the patient improved I have reviewed the patients home medicines and have made adjustments as needed    Problem List / ED Course:  AMS:  likely due to covid.  Covid panel pending.  Pt signed out to Dr. Darl Householder at shift change.   Social Determinants of Health:  Lives at home   Dispostion:  Pending labs.    Drexler Maland was evaluated in Emergency Department on 11/11/2021 for the symptoms described in the history of present illness. He was evaluated in the context of the global COVID-19 pandemic, which necessitated consideration that the patient might be at risk for infection with the SARS-CoV-2 virus that causes COVID-19. Institutional protocols and algorithms that pertain to the evaluation of patients at risk for COVID-19 are in a state of rapid change based on information released by regulatory bodies including the CDC and federal and state organizations. These policies and algorithms were followed during the patient's care in the ED.         Final Clinical Impression(s) / ED Diagnoses Final diagnoses:  Dehydration    Rx / DC Orders ED Discharge Orders     None         Isla Pence, MD 11/11/21 1537

## 2021-11-11 NOTE — ED Notes (Signed)
Oxygen dropped to 89% on room air. 2L [place] placed on pt.

## 2021-11-11 NOTE — ED Notes (Signed)
In and Out cath unsuccessful. Pt request to try urinal at bedside again for a little bit.

## 2021-11-11 NOTE — H&P (Signed)
History and Physical    Steve Burton QQP:[id number] DOB: 12-18-1955 DOA: 11/11/2021  PCP: Carlena Hurl, PA-C  Patient coming from: Home  Chief Complaint: Confusion  HPI: Steve Burton is a 66 y.o. male with medical history significant of hypertension, hyperlipidemia, stroke with residual left spastic hemiparesis, seizure disorder presented to the ED via EMS for evaluation of fevers, URI symptoms, confusion, and urinary incontinence.  He had a virtual visit with PCP earlier today.  His wife recently tested positive for COVID after traveling to [place].  In the ED, patient noted to be febrile with temperature as high as 102.5 F and oxygen saturation as low as 89% on room air.  He was placed on 2 L O2.  Labs showing no leukocytosis, hemoglobin 11.6 with MCV 85.9 (hemoglobin was 12.9 on 09/27/2021 and 12.7 a year ago), platelet count 134k, potassium 2.7, magnesium 2.1, creatinine 1.4 (baseline 1.0), AST 78 with normal remainder of LFTs, SARS-CoV-2 PCR positive, UA pending.  Chest x-ray showing minimal right basilar atelectasis.  CT head showing new area of hypodensity abutting the left occipital horn, may represent sequela of chronic microvascular ischemic change versus an acute infarct.  Patient was given Tylenol, molnupiravir, oral and IV potassium, and started on normal saline at 100 cc/h.  Patient is confused and slow to respond to questions.  States his wife sent him to the hospital as he was having difficulty controlling his bladder. Denies fevers, rhinorrhea, cough, shortness of breath, chest pain, nausea, vomiting, abdominal pain, or diarrhea.  He reports history of stroke and unable to move his left upper extremity since then but is able to walk using a walker.  Review of Systems:  Review of Systems  All other systems reviewed and are negative.   Past Medical History:  Diagnosis Date   Elevated LDL cholesterol level    Elevated PSA measurement 11/22/2016   Hyperlipidemia     Hypertension    Impaired fasting glucose 11/22/2016   Left hemiparesis (HCC)    Spastic hemiparesis ([facility])    Stroke [facility]) 2011    Past Surgical History:  Procedure Laterality Date   CATARACT EXTRACTION  01/04/2004   right eye   HERNIA REPAIR Right 01/04/2003   inguinal     reports that he has never smoked. He has never used smokeless tobacco. He reports current alcohol use. He reports that he does not use drugs.  Allergies  Allergen Reactions   Phenytoin Sodium Extended Swelling and Other (See Comments)    Dilantin- Arm swelling    Penicillins Rash    Has patient had a PCN reaction causing immediate rash, facial/tongue/throat swelling, SOB or lightheadedness with hypotension: Unknown Has patient had a PCN reaction causing severe rash involving mucus membranes or skin necrosis: Unk Has patient had a PCN reaction that required hospitalization: Unk Has patient had a PCN reaction occurring within the last 10 years: Yes If all of the above answers are "NO", then may proceed with Cephalosporin use.     Family History  Problem Relation Age of Onset   Stroke Mother    Heart disease Father        MI at age 32   Epilepsy Sister    Colon cancer Neg Hx     Prior to Admission medications   Medication Sig Start Date End Date Taking? Authorizing Provider  atorvastatin (LIPITOR) 10 MG tablet TAKE 1 TABLET BY MOUTH DAILY 09/28/21   Tysinger, Camelia Eng, PA-C  baclofen (LIORESAL) 10 MG tablet Take 1  tablet (10 mg total) by mouth 3 (three) times daily. 09/29/21   Tysinger, Camelia Eng, PA-C  diltiazem (CARDIZEM CD) 240 MG 24 hr capsule TAKE 1 CAPSULE BY MOUTH DAILY 10/04/21   Lelon Perla, MD  labetalol (NORMODYNE) 100 MG tablet TAKE 1 TABLET BY MOUTH TWICE  DAILY 10/04/21   Lelon Perla, MD  labetalol (NORMODYNE) 200 MG tablet TAKE 1 TABLET BY MOUTH TWICE  DAILY 10/04/21   Lelon Perla, MD  levETIRAcetam (KEPPRA) 500 MG tablet TAKE 1 TABLET BY MOUTH TWICE  DAILY 10/04/21   Frann Rider, NP  losartan (COZAAR) 100 MG tablet TAKE 1 TABLET BY MOUTH DAILY 10/04/21   Tysinger, Camelia Eng, PA-C  molnupiravir EUA (LAGEVRIO) 200 mg CAPS capsule Take 4 capsules (800 mg total) by mouth 2 (two) times daily for 5 days. 11/11/21 11/16/21  Tysinger, Camelia Eng, PA-C  Multiple Vitamins-Minerals (CENTRUM SILVER 50+MEN) TABS Take 1 tablet by mouth daily with lunch.    [provider]  potassium chloride SA (KLOR-CON M) 20 MEQ tablet TAKE 2 TABLETS BY MOUTH  DAILY 03/08/21   Lelon Perla, MD  spironolactone (ALDACTONE) 25 MG tablet TAKE 1 TABLET BY MOUTH DAILY 10/04/21   Lelon Perla, MD    Physical Exam: Vitals:   11/11/21 1926 11/11/21 1927 11/11/21 2022 11/11/21 2030  BP: (!) 151/71   134/67  Pulse: 60  66 66  Resp: 15   19  Temp:  (!) 101.6 F (38.7 C)    TempSrc:  Oral    SpO2: 93%  (!) 89% 93%  Weight:      Height:        Physical Exam Vitals reviewed.  Constitutional:      General: He is not in acute distress. HENT:     Head: Normocephalic and atraumatic.     Mouth/Throat:     Mouth: Mucous membranes are dry.  Eyes:     Extraocular Movements: Extraocular movements intact.  Cardiovascular:     Rate and Rhythm: Normal rate and regular rhythm.     Pulses: Normal pulses.  Pulmonary:     Effort: Pulmonary effort is normal. No respiratory distress.     Breath sounds: No wheezing.  Abdominal:     General: Bowel sounds are normal. There is no distension.     Palpations: Abdomen is soft.     Tenderness: There is no abdominal tenderness.  Musculoskeletal:     Cervical back: Normal range of motion.     Right lower leg: No edema.     Left lower leg: No edema.  Skin:    General: Skin is warm and dry.  Neurological:     Mental Status: He is alert.     Comments: Oriented to person and place only Confused and slow to respond to questions Able to move all extremities on command except his left upper extremity     Labs on Admission: I have personally  reviewed following labs and imaging studies  CBC: Recent Labs  Lab 11/11/21 1430  WBC 4.1  NEUTROABS 2.6  HGB 11.6*  HCT 34.0*  MCV 85.9  PLT 811*   Basic Metabolic Panel: Recent Labs  Lab 11/11/21 1430 11/11/21 1550  NA 140  --   K 2.7*  --   CL 99  --   CO2 29  --   GLUCOSE 109*  --   BUN 20  --   CREATININE 1.47*  --   CALCIUM 8.7*  --  MG  --  2.1   GFR: Estimated Creatinine Clearance: 52.1 mL/min (A) (by C-G formula based on SCr of 1.47 mg/dL (H)). Liver Function Tests: Recent Labs  Lab 11/11/21 1430  AST 78*  ALT 32  ALKPHOS 66  BILITOT 1.1  PROT 5.9*  ALBUMIN 3.5   No results for input(s): "LIPASE", "AMYLASE" in the last 168 hours. No results for input(s): "AMMONIA" in the last 168 hours. Coagulation Profile: No results for input(s): "INR", "PROTIME" in the last 168 hours. Cardiac Enzymes: No results for input(s): "CKTOTAL", "CKMB", "CKMBINDEX", "TROPONINI" in the last 168 hours. BNP (last 3 results) No results for input(s): "PROBNP" in the last 8760 hours. HbA1C: No results for input(s): "HGBA1C" in the last 72 hours. CBG: Recent Labs  Lab 11/11/21 1457  GLUCAP 116*   Lipid Profile: No results for input(s): "CHOL", "HDL", "LDLCALC", "TRIG", "CHOLHDL", "LDLDIRECT" in the last 72 hours. Thyroid Function Tests: No results for input(s): "TSH", "T4TOTAL", "FREET4", "T3FREE", "THYROIDAB" in the last 72 hours. Anemia Panel: No results for input(s): "VITAMINB12", "FOLATE", "FERRITIN", "TIBC", "IRON", "RETICCTPCT" in the last 72 hours. Urine analysis:    Component Value Date/Time   COLORURINE STRAW (A) 04/20/2019 0126   APPEARANCEUR CLEAR 04/20/2019 0126   LABSPEC 1.011 04/20/2019 0126   LABSPEC 1.015 11/23/2016 1357   PHURINE 7.0 04/20/2019 0126   GLUCOSEU NEGATIVE 04/20/2019 0126   HGBUR NEGATIVE 04/20/2019 0126   BILIRUBINUR NEGATIVE 04/20/2019 0126   BILIRUBINUR negative 11/23/2016 1357   BILIRUBINUR n 11/19/2015 1034   KETONESUR 5 (A)  04/20/2019 0126   PROTEINUR 30 (A) 04/20/2019 0126   UROBILINOGEN negative 11/19/2015 1034   UROBILINOGEN 0.2 04/24/2009 1604   NITRITE NEGATIVE 04/20/2019 0126   LEUKOCYTESUR NEGATIVE 04/20/2019 0126    Radiological Exams on Admission: CT HEAD WO CONTRAST (5MM)  Result Date: 11/11/2021 CLINICAL DATA:  Mental status change EXAM: CT HEAD WITHOUT CONTRAST TECHNIQUE: Contiguous axial images were obtained from the base of the skull through the vertex without intravenous contrast. RADIATION DOSE REDUCTION: This exam was performed according to the departmental dose-optimization program which includes automated exposure control, adjustment of the mA and/or kV according to patient size and/or use of iterative reconstruction technique. COMPARISON:  CT head 05/13/2009, MRI Brain 04/20/2018 FINDINGS: Brain: Redemonstrated large area of encephalomalacia in the right cerebral hemisphere in the frontoparietal region, at a site of remote intraparenchymal hemorrhage. Ex vacuo dilatation of the right ventricular atrium, unchanged from prior exam. No new hemorrhage. No midline shift. No extra-axial fluid collection. Compared to prior exam there is a new area of hypodensity abutting the left occipital horn (series 7, image 19). This was not seen on prior CT and definitively visualized on the recent brain MRI. Vascular: No hyperdense vessel or unexpected calcification. Skull: Normal. Negative for fracture or focal lesion. Sinuses/Orbits: No acute finding. Mild mucosal thickening bilateral maxillary sinus with osseous findings of chronic right maxillary sinusitis. Bilateral mastoid air cells are clear. Bilateral lens replacements. Other: None IMPRESSION: New area of hypodensity abutting the left occipital horn, which was not seen on prior CT and definitively visualized on the recent brain MRI. This may represent sequela of chronic microvascular ischemic change, but if there is clinical concern for an acute infarct, further  evaluation with a brain MRI is recommended. Electronically Signed   By: Marin Roberts M.D.   On: 11/11/2021 19:48   DG Chest Portable 1 View  Result Date: 11/11/2021 CLINICAL DATA:  , History of stroke and LEFT side deficits, altered mental  status EXAM: PORTABLE CHEST 1 VIEW COMPARISON:  Portable exam 1427 hours compared to 04/20/2019 FINDINGS: Upper normal heart size. Mediastinal contours and pulmonary vascularity normal. Minimal RIGHT basilar atelectasis. Lungs otherwise clear. No infiltrate, pleural effusion, or pneumothorax. Bones demineralized. IMPRESSION: Minimal RIGHT basilar atelectasis. Electronically Signed   By: Lavonia Dana M.D.   On: 11/11/2021 14:43    Assessment and Plan  COVID-19 viral infection Acute hypoxemic respiratory failure Patient febrile but does not meet any other SIRS criteria.  Oxygen saturation as low as 89% on room air, improved with 2 L O2.  SARS-CoV-2 PCR positive.  Chest x-ray showing minimal right basilar atelectasis. -Continue molnupiravir -Start Decadron -Acetaminophen as needed for fevers -Stat CT chest without contrast ordered for further evaluation -Check inflammatory markers including ferritin, fibrinogen, D-dimer, CRP, LDH -Check procalcitonin level -Airborne and contact precautions -Incentive spirometry, flutter valve -Encourage prone positioning -Continuous pulse ox -Supplemental oxygen as needed to keep oxygen saturation above 77%  Acute metabolic encephalopathy Likely due to acute viral infection. CT head showing new area of hypodensity abutting the left occipital horn, may represent sequela of chronic microvascular ischemic change versus an acute infarct. -Continue management of COVID infection as mentioned above -Stat brain MRI without contrast for further evaluation -UA pending to rule out UTI  Urinary incontinence -UA pending  Mild normocytic anemia No signs of active bleeding. -Continue to monitor  Mild elevation of  transaminase Likely due to acute viral infection. -Continue to monitor  Mild thrombocytopenia Likely due to acute viral infection.  No signs of active bleeding. -Continue to monitor  Hypokalemia Likely due to poor p.o. intake.  Magnesium level is normal. -Stat EKG -Cardiac monitoring -Continue to monitor potassium level and replace as needed.  AKI Likely prerenal from dehydration in the setting of acute viral infection.  Creatinine 1.4, baseline 1.0. -Continue IV fluid hydration -Monitor renal function -Avoid nephrotoxic agents/hold home losartan and spironolactone  Hypertension: Currently normotensive. Hyperlipidemia History of stroke with residual left spastic hemiparesis Seizure disorder -Continue home meds after pharmacy med rec is done except hold losartan and spironolactone given AKI.  DVT prophylaxis: Lovenox Code Status: Full Code Family Communication: No family available at this time. Level of care: Telemetry bed Admission status: It is my clinical opinion that admission to INPATIENT is reasonable and necessary because of the expectation that this patient will require hospital care that crosses at least 2 midnights to treat this condition based on the medical complexity of the problems presented.  Given the aforementioned information, the predictability of an adverse outcome is felt to be significant.   Shela Leff MD [facility]  If 7PM-7AM, please contact night-coverage [email or web address]  11/11/2021, 8:34 PM

## 2021-11-11 NOTE — ED Notes (Signed)
Patient transported to CT 

## 2021-11-11 NOTE — Progress Notes (Signed)
Subjective:     Patient ID: Steve Burton, male   DOB: [birth year], 66 y.o.   MRN: [id number]  This visit type was conducted due to national recommendations for restrictions regarding the COVID-19 Pandemic (e.g. social distancing) in an effort to limit this patient's exposure and mitigate transmission in our community.  Due to their co-morbid illnesses, this patient is at least at moderate risk for complications without adequate follow up.  This format is felt to be most appropriate for this patient at this time.    Documentation for virtual audio and video telecommunications through [facility] encounter:  The patient was located at home. The provider was located in the office. The patient did consent to this visit and is aware of possible charges through their insurance for this visit.  The other persons participating in this telemedicine service were none. Time spent on call was 20 minutes and in review of previous records 20 minutes total.  This virtual service is not related to other E/M service within previous 7 days.   HPI Chief Complaint  Patient presents with   possible Covid    Symptoms started 2 days ago- Fever, urinating in bathroom floor, wetting bed, no cough, confused, fatigue, wife was positive for covid.    Virtual consult for illness, history mainly provided by wife Esto.  She is his caregiver.  He has a history of stroke and left hemiparesis, but is normally of sound mind in usually does not have issues with incontinence.  Wife just got back from [place] as her father unfortunately passed away recently.  She started getting sick 4 days ago and tested positive for COVID last night.  [name] started having symptoms 4 days ago when he was not feeling well and having some trouble swallowing.  Over the next few days he had several episodes of urinary incontinence which he never has.  He also seemed lethargic and confused.  In the last few days he has started having  congestion, sneezing, fever last night low-grade but no fever today.  He is less lethargic and more his normal self today but still not feeling great.  He is conversing and speaking more normal today but resting a lot today, feeling tired.     No chest pain, no sore throat, no NVD, no wheezing or SOB. No other aggravating or relieving factors. No other complaint.   Past Medical History:  Diagnosis Date   Elevated LDL cholesterol level    Elevated PSA measurement 11/22/2016   Hyperlipidemia    Hypertension    Impaired fasting glucose 11/22/2016   Left hemiparesis (HCC)    Spastic hemiparesis (HCC)    Stroke ([place]) 2011   Current Outpatient Medications on File Prior to Visit  Medication Sig Dispense Refill   atorvastatin (LIPITOR) 10 MG tablet TAKE 1 TABLET BY MOUTH DAILY 90 tablet 3   baclofen (LIORESAL) 10 MG tablet Take 1 tablet (10 mg total) by mouth 3 (three) times daily. 270 tablet 0   diltiazem (CARDIZEM CD) 240 MG 24 hr capsule TAKE 1 CAPSULE BY MOUTH DAILY 100 capsule 3   labetalol (NORMODYNE) 100 MG tablet TAKE 1 TABLET BY MOUTH TWICE  DAILY 200 tablet 3   labetalol (NORMODYNE) 200 MG tablet TAKE 1 TABLET BY MOUTH TWICE  DAILY 200 tablet 3   levETIRAcetam (KEPPRA) 500 MG tablet TAKE 1 TABLET BY MOUTH TWICE  DAILY 200 tablet 2   losartan (COZAAR) 100 MG tablet TAKE 1 TABLET BY MOUTH DAILY 100 tablet 3  Multiple Vitamins-Minerals (CENTRUM SILVER 50+MEN) TABS Take 1 tablet by mouth daily with lunch.     potassium chloride SA (KLOR-CON M) 20 MEQ tablet TAKE 2 TABLETS BY MOUTH  DAILY 120 tablet 5   spironolactone (ALDACTONE) 25 MG tablet TAKE 1 TABLET BY MOUTH DAILY 100 tablet 0   No current facility-administered medications on file prior to visit.   Review of Systems As in subjective    Objective:   Physical Exam Due to coronavirus pandemic stay at home measures, patient visit was virtual and they were not examined in person.   Temp (!) 102.2 F (39 C) Comment:  yesterday  130/69 Temp 37.4 Unable to see virtually today, just through wife and phone communication      Assessment:     Encounter Diagnoses  Name Primary?   Lethargic Yes   Close exposure to COVID-19 virus    Fever, unspecified fever cause    Head congestion    Other urinary incontinence    Essential hypertension    Spasticity due to old stroke    History of hemorrhagic cerebrovascular accident (CVA) with residual deficit    Left spastic hemiparesis ([place])        Plan:     We discussed limitations of virtual consult.  We discussed that in this case is particular difficult since this is a virtual, he has underlying hemiparesis and history of stroke but now has some new confusion and incontinence in the last few days.  I am particular concerned about him being weak and confused and incontinence the last few days.  He very likely has COVID given the wife's positive and has symptoms and they have been in the same household.  She is his caregiver.  She feels like he is significantly better today compared to the last several days.  I recommend she take him or have him transported to the emergency department.  She does not necessarily want to do that right now.  She is herself positive for COVID and we did a visit with her today as well.  She is not doing too bad and I did send out medicine for her to start today.  I sent out molnupiravir for [place], but again stressed that he really should be seen in person.  We discussed the need for hydration, rest.  He was able to take his medicines last few days and able to drink fluids throughout the day.  I asked her to call back in a couple hours if she decided not to take him to the emergency department to get an update on his symptoms.   Noel was seen today for possible covid.  Diagnoses and all orders for this visit:  Lethargic  Close exposure to COVID-19 virus  Fever, unspecified fever cause  Head congestion  Other urinary  incontinence  Essential hypertension  Spasticity due to old stroke  History of hemorrhagic cerebrovascular accident (CVA) with residual deficit  Left spastic hemiparesis ([place])  Other orders -     Discontinue: molnupiravir EUA (LAGEVRIO) 200 mg CAPS capsule; Take 4 capsules (800 mg total) by mouth 2 (two) times daily for 5 days. -     molnupiravir EUA (LAGEVRIO) 200 mg CAPS capsule; Take 4 capsules (800 mg total) by mouth 2 (two) times daily for 5 days.    F/u tomorrow

## 2021-11-12 ENCOUNTER — Inpatient Hospital Stay (HOSPITAL_COMMUNITY): Payer: Medicare Other

## 2021-11-12 ENCOUNTER — Other Ambulatory Visit: Payer: Self-pay

## 2021-11-12 DIAGNOSIS — U071 COVID-19: Secondary | ICD-10-CM | POA: Diagnosis not present

## 2021-11-12 LAB — URINALYSIS, ROUTINE W REFLEX MICROSCOPIC
Bilirubin Urine: NEGATIVE
Glucose, UA: NEGATIVE mg/dL
Ketones, ur: 20 mg/dL — AB
Leukocytes,Ua: NEGATIVE
Nitrite: NEGATIVE
Protein, ur: 30 mg/dL — AB
Specific Gravity, Urine: 1.006 (ref 1.005–1.030)
pH: 7 (ref 5.0–8.0)

## 2021-11-12 LAB — COMPREHENSIVE METABOLIC PANEL
ALT: 40 U/L (ref 0–44)
AST: 79 U/L — ABNORMAL HIGH (ref 15–41)
Albumin: 3.4 g/dL — ABNORMAL LOW (ref 3.5–5.0)
Alkaline Phosphatase: 67 U/L (ref 38–126)
Anion gap: 14 (ref 5–15)
BUN: 16 mg/dL (ref 8–23)
CO2: 27 mmol/L (ref 22–32)
Calcium: 8.5 mg/dL — ABNORMAL LOW (ref 8.9–10.3)
Chloride: 103 mmol/L (ref 98–111)
Creatinine, Ser: 1.29 mg/dL — ABNORMAL HIGH (ref 0.61–1.24)
GFR, Estimated: >60 mL/min (ref >60)
Glucose, Bld: 116 mg/dL — ABNORMAL HIGH (ref 70–99)
Potassium: 3.3 mmol/L — ABNORMAL LOW (ref 3.5–5.1)
Sodium: 144 mmol/L (ref 135–145)
Total Bilirubin: 1.1 mg/dL (ref 0.3–1.2)
Total Protein: 6.1 g/dL — ABNORMAL LOW (ref 6.5–8.1)

## 2021-11-12 LAB — POTASSIUM: Potassium: 2.4 mmol/L — CL (ref 3.5–5.1)

## 2021-11-12 LAB — CBC
HCT: 36.4 % — ABNORMAL LOW (ref 39.0–52.0)
Hemoglobin: 12.6 g/dL — ABNORMAL LOW (ref 13.0–17.0)
MCH: 29.5 pg (ref 26.0–34.0)
MCHC: 34.6 g/dL (ref 30.0–36.0)
MCV: 85.2 fL (ref 80.0–100.0)
Platelets: 114 10*3/uL — ABNORMAL LOW (ref 150–400)
RBC: 4.27 MIL/uL (ref 4.22–5.81)
RDW: 13.1 % (ref 11.5–15.5)
WBC: 3.6 10*3/uL — ABNORMAL LOW (ref 4.0–10.5)
nRBC: 0 % (ref 0.0–0.2)

## 2021-11-12 LAB — FIBRINOGEN: Fibrinogen: 403 mg/dL (ref 210–475)

## 2021-11-12 LAB — PROCALCITONIN: Procalcitonin: <0.1 ng/mL

## 2021-11-12 LAB — LACTATE DEHYDROGENASE: LDH: 174 U/L (ref 98–192)

## 2021-11-12 LAB — D-DIMER, QUANTITATIVE: D-Dimer, Quant: 0.83 ug/mL-FEU — ABNORMAL HIGH (ref 0.00–0.50)

## 2021-11-12 LAB — FERRITIN: Ferritin: 119 ng/mL (ref 24–336)

## 2021-11-12 LAB — C-REACTIVE PROTEIN: CRP: 4.5 mg/dL — ABNORMAL HIGH (ref <1.0)

## 2021-11-12 MED ORDER — POTASSIUM CHLORIDE 20 MEQ PO PACK
40.0000 meq | PACK | Freq: Once | ORAL | Status: AC
  Administered 2021-11-12: 40 meq via ORAL
  Filled 2021-11-12: qty 2

## 2021-11-12 MED ORDER — ASPIRIN 81 MG PO TBEC
81.0000 mg | DELAYED_RELEASE_TABLET | Freq: Every day | ORAL | Status: DC
  Administered 2021-11-13 – 2021-11-17 (×5): 81 mg via ORAL
  Filled 2021-11-12 (×5): qty 1

## 2021-11-12 MED ORDER — POTASSIUM CHLORIDE 10 MEQ/100ML IV SOLN
10.0000 meq | INTRAVENOUS | Status: AC
  Administered 2021-11-12 (×6): 10 meq via INTRAVENOUS
  Filled 2021-11-12 (×6): qty 100

## 2021-11-12 MED ORDER — LEVETIRACETAM 500 MG PO TABS
500.0000 mg | ORAL_TABLET | Freq: Two times a day (BID) | ORAL | Status: DC
  Administered 2021-11-12 – 2021-11-17 (×11): 500 mg via ORAL
  Filled 2021-11-12 (×11): qty 1

## 2021-11-12 MED ORDER — ATORVASTATIN CALCIUM 10 MG PO TABS
10.0000 mg | ORAL_TABLET | Freq: Every day | ORAL | Status: DC
  Administered 2021-11-12 – 2021-11-13 (×2): 10 mg via ORAL
  Filled 2021-11-12 (×2): qty 1

## 2021-11-12 NOTE — ED Notes (Signed)
Linens changed. New condom catheter placed on pt. Clean gown and nonslip socks provided.

## 2021-11-12 NOTE — ED Notes (Signed)
Steve Burton, wife, [phone] would like an update when available

## 2021-11-12 NOTE — ED Notes (Signed)
Patient transported to MRI 

## 2021-11-12 NOTE — ED Notes (Signed)
Bed changed with new linens and new gown. Condom cath in place. Placed back on cardiac leads after returning from MRI. Call light within reach. Lights turned down to promote a resting environment.

## 2021-11-12 NOTE — Consult Note (Signed)
NEURO HOSPITALIST CONSULT NOTE   Requestig physician: Dr. Erlinda Hong   Reason for Consult: Incidental Stroke   History obtained from:  Patient and Chart    HPI:                                                                                                                                          Steve Burton is an 66 y.o. male with PMHx of hypertension, hyperlipidemia, stroke with residual left spastic hemiparesis in 2011, seizure disorder  and presenting to ED for evaluation of fevers, URI symptoms, confusion, and urinary incontinence, and was found to have small acute infarct in the left frontal lobe. Evaluated this morning, reporting improvement of his symptoms compared to yesterday. States that he felt considerably weaker yesterday. He has difficulty hearing but is able to answer questions during the evaluation. Presently denies any acute focal deficits, states that he has chronic left sided weakness from his previous stroke. He is COVID + and is currently on 2 L via [place].   Past Medical History:  Diagnosis Date   Elevated LDL cholesterol level    Elevated PSA measurement 11/22/2016   Hyperlipidemia    Hypertension    Impaired fasting glucose 11/22/2016   Left hemiparesis (HCC)    Spastic hemiparesis ([place])    Stroke ([place]) 2011    Past Surgical History:  Procedure Laterality Date   CATARACT EXTRACTION  01/04/2004   right eye   HERNIA REPAIR Right 01/04/2003   inguinal    Family History  Problem Relation Age of Onset   Stroke Mother    Heart disease Father        MI at age 70   Epilepsy Sister    Colon cancer Neg Hx     Social History:  reports that he has never smoked. He has never used smokeless tobacco. He reports current alcohol use. He reports that he does not use drugs.  Allergies  Allergen Reactions   Phenytoin Sodium Extended Swelling and Other (See Comments)    Dilantin- Arm swelling    Penicillins Rash    Has patient had a PCN reaction  causing immediate rash, facial/tongue/throat swelling, SOB or lightheadedness with hypotension: Unknown Has patient had a PCN reaction causing severe rash involving mucus membranes or skin necrosis: Unk Has patient had a PCN reaction that required hospitalization: Unk Has patient had a PCN reaction occurring within the last 10 years: Yes If all of the above answers are "NO", then may proceed with Cephalosporin use.     MEDICATIONS:  I have reviewed the patient's current medications. Prior to Admission: (Not in a hospital admission)  Scheduled:  atorvastatin  10 mg Oral Daily   dexamethasone (DECADRON) injection  6 mg Intravenous Q24H   enoxaparin (LOVENOX) injection  40 mg Subcutaneous Q24H   levETIRAcetam  500 mg Oral BID   molnupiravir EUA  4 capsule Oral BID     ROS:                                                                                                                                       History obtained from chart review and the patient  General ROS: negative for - chills, fatigue, fever, night sweats, weight gain or weight loss Psychological ROS: negative for - behavioral disorder, hallucinations, memory difficulties, mood swings or suicidal ideation Ophthalmic ROS: negative for - blurry vision, double vision, eye pain or loss of vision ENT ROS: negative for - epistaxis, nasal discharge, oral lesions, sore throat, tinnitus or vertigo Allergy and Immunology ROS: negative for - hives or itchy/watery eyes Hematological and Lymphatic ROS: negative for - bleeding problems, bruising or swollen lymph nodes Endocrine ROS: negative for - galactorrhea, hair pattern changes, polydipsia/polyuria or temperature intolerance Respiratory ROS: negative for - cough, hemoptysis, shortness of breath or wheezing Cardiovascular ROS: negative for - chest pain, dyspnea on  exertion, edema or irregular heartbeat Gastrointestinal ROS: negative for - abdominal pain, diarrhea, hematemesis, nausea/vomiting or stool incontinence Genito-Urinary ROS: negative for - dysuria, hematuria, incontinence or urinary frequency/urgency Musculoskeletal ROS: negative for - joint swelling or muscular weakness Neurological ROS: as noted in HPI Dermatological ROS: negative for rash and skin lesion changes   Blood pressure (!) 169/98, pulse 88, temperature 98.5 F (36.9 C), temperature source Oral, resp. rate (!) 23, height 6' (1.829 m), weight 72.6 kg, SpO2 100 %.   General Examination:                                                                                                      Physical Exam  General: Elderly male, resting comfortably at bedside, no acute distress HEENT-  Normocephalic, no lesions, without obvious abnormality.  Normal external eye and conjunctiva.   Cardiovascular- S1-S2 audible, pulses palpable throughout   Lungs-no rhonchi or wheezing noted, no excessive working breathing.  Saturations within normal limits Abdomen- All 4 quadrants palpated and nontender Extremities- Warm, dry and intact Musculoskeletal-no joint tenderness, deformity or swelling Skin-warm and dry, no hyperpigmentation, vitiligo, or suspicious lesions  Neurological Examination Mental  Status: Alert, oriented, thought content appropriate.  Speech fluent without evidence of aphasia.  Able to follow 3 step commands without difficulty. Cranial Nerves: II: Discs flat bilaterally; Visual fields grossly normal,  III,IV, VI: ptosis not present, extra-ocular motions intact bilaterally pupils equal, round, reactive to light and accommodation V,VII: smile symmetric, facial light touch sensation intact on right, diminished on left VIII: decreased hearing bilaterally.  IX,X: uvula rises symmetrically XI: bilateral shoulder shrug XII: midline tongue extension Motor: UE  Strength  Right  Left Deltoids  4/5  2/5 Triceps   4/5  2/5 Biceps   4/5  2/5 Wrist FL  5/5  2/5 Wrist Ext  4/5  2/5 Finger grip  4/5  2/5 LE Strength  Right  Left Hip Flex  4/5  2/5 Knee flexed  4/5  2/5 Knee X  4/5  2/5 Dorsiflexion  4/5  2/5 Plantarflexion  4/5  2/5  Tone and bulk:normal tone throughout; no atrophy noted Sensory: Pinprick and light touch intact on right, diminished on left Deep Tendon Reflexes: 2+ and symmetric throughout Cerebellar: FNF: Normal on right, unable to perform on left Heel-to-shin: Normal on right, unable to perform on left Gait: normal gait and station   Lab Results: Basic Metabolic Panel: Recent Labs  Lab 11/11/21 1430 11/11/21 1550 11/11/21 2330 11/12/21 0611  NA 140  --   --  144  K 2.7*  --  2.4* 3.3*  CL 99  --   --  103  CO2 29  --   --  27  GLUCOSE 109*  --   --  116*  BUN 20  --   --  16  CREATININE 1.47*  --   --  1.29*  CALCIUM 8.7*  --   --  8.5*  MG  --  2.1  --   --     CBC: Recent Labs  Lab 11/11/21 1430 11/12/21 0611  WBC 4.1 3.6*  NEUTROABS 2.6  --   HGB 11.6* 12.6*  HCT 34.0* 36.4*  MCV 85.9 85.2  PLT 134* 114*    Cardiac Enzymes: No results for input(s): "CKTOTAL", "CKMB", "CKMBINDEX", "TROPONINI" in the last 168 hours.  Lipid Panel: No results for input(s): "CHOL", "TRIG", "HDL", "CHOLHDL", "VLDL", "LDLCALC" in the last 168 hours.  Imaging: MR BRAIN WO CONTRAST  Result Date: 11/12/2021 CLINICAL DATA:  Confusion, history of stroke and seizure disorder; febrile EXAM: MRI HEAD WITHOUT CONTRAST TECHNIQUE: Multiplanar, multiecho pulse sequences of the brain and surrounding structures were obtained without intravenous contrast. COMPARISON:  04/20/2019 MRI head correlation is also made with CT head 11/11/2021 FINDINGS: Evaluation is somewhat limited by motion artifact. Brain: Small focus of restricted diffusion with ADC correlate in the left frontal white matter (series 7, image 62). Redemonstrated large  area of encephalomalacia in the right cerebral hemisphere,, the site of a remote intraparenchymal hematoma. No acute hemorrhage, mass, mass effect, or midline shift. No hydrocephalus or extra-axial collection. Vascular: Normal proximal arterial flow voids. Skull and upper cervical spine: Normal marrow signal. Sinuses/Orbits: Mild mucosal thickening in the maxillary sinuses. Status post bilateral lens replacements. Other: The mastoids are well aerated. IMPRESSION: 1. Small acute infarct in the left frontal white matter. 2. Redemonstrated large area of encephalomalacia in the right cerebral hemisphere, the site of a remote intraparenchymal hematoma. These results will be called to the ordering clinician or representative by the Radiologist Assistant, and communication documented in the PACS or [employer]. Electronically Signed   By: Francetta Found.D.  On: 11/12/2021 02:46   CT CHEST WO CONTRAST  Result Date: 11/11/2021 CLINICAL DATA:  COVID-19 with hypoxia EXAM: CT CHEST WITHOUT CONTRAST TECHNIQUE: Multidetector CT imaging of the chest was performed following the standard protocol without IV contrast. RADIATION DOSE REDUCTION: This exam was performed according to the departmental dose-optimization program which includes automated exposure control, adjustment of the mA and/or kV according to patient size and/or use of iterative reconstruction technique. COMPARISON:  Radiographs 11/11/2021 FINDINGS: Cardiovascular: Mitral annular calcification. Mild coronary artery calcification. Aortic valve calcification. Normal heart size. No pericardial effusion. Mediastinum/Nodes: Unremarkable thyroid. No thoracic adenopathy. Unremarkable esophagus. Lungs/Pleura: Bilateral lower lobe atelectasis/scarring with architectural distortion and mild bronchiolectasis. Posterior mild reticular opacities. Focal thickening in the right major fissure may be a small amount of fluid. No pneumothorax. Small amount of layering  secretions in the trachea. Upper Abdomen: No acute abnormality. Musculoskeletal: No chest wall mass or suspicious bone lesions identified. IMPRESSION: Chronic scarring greatest in the bilateral lower lobes. Mild amount of layering secretions in the trachea. Findings could be due to chronic aspiration. No focal pneumonia. Electronically Signed   By: Placido Sou M.D.   On: 11/11/2021 23:07   CT HEAD WO CONTRAST (5MM)  Result Date: 11/11/2021 CLINICAL DATA:  Mental status change EXAM: CT HEAD WITHOUT CONTRAST TECHNIQUE: Contiguous axial images were obtained from the base of the skull through the vertex without intravenous contrast. RADIATION DOSE REDUCTION: This exam was performed according to the departmental dose-optimization program which includes automated exposure control, adjustment of the mA and/or kV according to patient size and/or use of iterative reconstruction technique. COMPARISON:  CT head 05/13/2009, MRI Brain 04/20/2018 FINDINGS: Brain: Redemonstrated large area of encephalomalacia in the right cerebral hemisphere in the frontoparietal region, at a site of remote intraparenchymal hemorrhage. Ex vacuo dilatation of the right ventricular atrium, unchanged from prior exam. No new hemorrhage. No midline shift. No extra-axial fluid collection. Compared to prior exam there is a new area of hypodensity abutting the left occipital horn (series 7, image 19). This was not seen on prior CT and definitively visualized on the recent brain MRI. Vascular: No hyperdense vessel or unexpected calcification. Skull: Normal. Negative for fracture or focal lesion. Sinuses/Orbits: No acute finding. Mild mucosal thickening bilateral maxillary sinus with osseous findings of chronic right maxillary sinusitis. Bilateral mastoid air cells are clear. Bilateral lens replacements. Other: None IMPRESSION: New area of hypodensity abutting the left occipital horn, which was not seen on prior CT and definitively visualized on the  recent brain MRI. This may represent sequela of chronic microvascular ischemic change, but if there is clinical concern for an acute infarct, further evaluation with a brain MRI is recommended. Electronically Signed   By: Marin Roberts M.D.   On: 11/11/2021 19:48   DG Chest Portable 1 View  Result Date: 11/11/2021 CLINICAL DATA:  , History of stroke and LEFT side deficits, altered mental status EXAM: PORTABLE CHEST 1 VIEW COMPARISON:  Portable exam 1427 hours compared to 04/20/2019 FINDINGS: Upper normal heart size. Mediastinal contours and pulmonary vascularity normal. Minimal RIGHT basilar atelectasis. Lungs otherwise clear. No infiltrate, pleural effusion, or pneumothorax. Bones demineralized. IMPRESSION: Minimal RIGHT basilar atelectasis. Electronically Signed   By: Lavonia Dana M.D.   On: 11/11/2021 14:43    Assessment and plan per attending neurologist   Assessment/    Impression:      Recommendations:     Christene Slates, MD PGY-1 11/12/2021, 11:41 AM

## 2021-11-12 NOTE — Progress Notes (Addendum)
PROGRESS NOTE    Steve Burton  GEZ:[id number] DOB: Mar 19, 1955 DOA: 11/11/2021 PCP: Carlena Hurl, PA-C     Brief Narrative:   hypertension, hyperlipidemia, stroke with residual left spastic hemiparesis (unable to move his left upper extremity since then but is able to walk using a walker), seizure disorder presented to the ED via EMS for evaluation of fevers, URI symptoms, confusion, and urinary incontinence.  He had a virtual visit with PCP earlier today.  His wife recently tested positive for COVID after traveling to [place].  In the ED, patient noted to be febrile with temperature as high as 102.5 F and oxygen saturation as low as 89% on room air.  He was placed on 2 L O2.  Patient is confused and slow to respond to questions.  States his wife sent him to the hospital as he was having difficulty controlling his bladder.   Subjective:  He is oriented x3 but pleasantly confused  Assessment & Plan:  Principal Problem:   COVID-19 virus infection Active Problems:   Essential hypertension   Left spastic hemiparesis (HCC)   Spasticity due to old stroke   Hyperlipidemia   Acute hypoxemic respiratory failure (HCC)   Acute metabolic encephalopathy   Urinary incontinence   Normocytic anemia   Elevated transaminase level   Thrombocytopenia (HCC)   Hypokalemia   AKI (acute kidney injury) ([place])   Seizure disorder ([place])    COVID-19 viral infection Acute hypoxemic respiratory failure -Has mild cough -Continue molnupiravir -Start Decadron   Acute metabolic encephalopathy MRI brain 1. Small acute infarct in the left frontal white matter. 2. Redemonstrated large area of encephalomalacia in the right cerebral hemisphere, the site of a remote intraparenchymal hematoma.  neurology consulted     Urinary incontinence -bladder scan 157cc -UA with rare bacteria, moderate hemoglobin, negative nitrite, negative leuks -ordered urine culture due to confusion and urinary  symptoms  Mild elevation of transaminase Likely due to acute viral infection. -Continue to monitor   Mild thrombocytopenia Likely due to acute viral infection.  No signs of active bleeding. -Continue to monitor   Hypokalemia Likely due to poor p.o. intake.  Magnesium level is normal. -Stat EKG -Cardiac monitoring -Continue to monitor potassium level and replace as needed.   AKI Likely prerenal from dehydration in the setting of acute viral infection.  Creatinine 1.4, baseline 1.0. -Continue IV fluid hydration -Monitor renal function -Avoid nephrotoxic agents/hold home losartan and spironolactone   Hypertension: Currently normotensive. Hyperlipidemia History of stroke with residual left spastic hemiparesis, does not appear to be antiplatelet at home Seizure disorder, resume keppra  FTT, wheelchair bound since 2011  I have Reviewed nursing notes, Vitals, pain scores, I/o's, Lab results and  imaging results since pt's last encounter, details please see discussion above  I ordered the following labs:  Unresulted Labs (From admission, onward)     Start     Ordered   11/13/21 0500  CBC with Differential/Platelet  Tomorrow morning,   R        11/12/21 0747   11/13/21 0500  Comprehensive metabolic panel  Tomorrow morning,   R        11/12/21 0747   11/13/21 0500  Magnesium  Tomorrow morning,   R        11/12/21 1845   11/13/21 0500  Phosphorus  Tomorrow morning,   R        11/12/21 1845   11/12/21 1843  Urine Culture  (Urine Culture)  Once,   R  Question:  Indication  Answer:  Altered mental status (if no other cause identified)   11/12/21 1842   11/11/21 2350  HIV Antibody (routine testing w rflx)  Once,   R        11/11/21 2350             DVT prophylaxis: enoxaparin (LOVENOX) injection 40 mg Start: 11/11/21 2200   Code Status:   Code Status: Full Code  Family Communication: none at bedside , tried all the phone numbers listed in chart, not able to reach  anyone Addendum: wife cell phone listed on chart is now correct I was able to talk to wife later when she came to the hospital and updated her Disposition:    Dispo: The patient is from: home              Anticipated d/c is to: TBD              Anticipated d/c date is: TBD  Antimicrobials:   Anti-infectives (From admission, onward)    Start     Dose/Rate Route Frequency Ordered Stop   11/11/21 2359  molnupiravir EUA (LAGEVRIO) capsule 800 mg        4 capsule Oral 2 times daily 11/11/21 2020 11/16/21 2159           Objective: Vitals:   11/12/21 1500 11/12/21 1600 11/12/21 1625 11/12/21 1641  BP: (!) 152/93 (!) 163/100  (!) 142/86  Pulse: 79 78  80  Resp: (!) 24 (!) 23  (!) 25  Temp:   99.3 F (37.4 C) 98.1 F (36.7 C)  TempSrc:   Oral Oral  SpO2: 97% 100%  97%  Weight:      Height:        Intake/Output Summary (Last 24 hours) at 11/12/2021 1845 Last data filed at 11/12/2021 1300 Gross per 24 hour  Intake 1900.51 ml  Output 1000 ml  Net 900.51 ml   Filed Weights   11/11/21 1417  Weight: 72.6 kg    Examination:  General exam: alert, pleasantly confused Respiratory system: Clear to auscultation. Respiratory effort normal. Cardiovascular system:  RRR.  Gastrointestinal system: Abdomen is nondistended, soft and nontender.  Normal bowel sounds heard. Central nervous system: Alert, pleasantly confused, left-sided weakness from prior CVA. Extremities:  no edema Skin: No rashes, lesions or ulcers Psychiatry: Pleasant, no agitation.     Data Reviewed: I have personally reviewed  labs and visualized  imaging studies since the last encounter and formulate the plan        Scheduled Meds:  atorvastatin  10 mg Oral Daily   dexamethasone (DECADRON) injection  6 mg Intravenous Q24H   enoxaparin (LOVENOX) injection  40 mg Subcutaneous Q24H   levETIRAcetam  500 mg Oral BID   molnupiravir EUA  4 capsule Oral BID   Continuous Infusions:  sodium chloride  Stopped (11/12/21 1739)     LOS: 1 day     Florencia Reasons, MD PhD FACP [facility]  Available via Epic secure chat 7am-7pm for nonurgent issues Please page for urgent issues To page the attending provider between 7A-7P or the covering provider during after hours 7P-7A, please log into the web site [email or web address] and access using universal [place] password for that web site. If you do not have the password, please call the hospital operator.    11/12/2021, 6:45 PM

## 2021-11-12 NOTE — Progress Notes (Signed)
Pt wife at bedside requesting an update of pt care. RN reached out to Erlinda Hong, MD. Junie Panning, Charge RN also notified of situation.

## 2021-11-13 ENCOUNTER — Inpatient Hospital Stay (HOSPITAL_COMMUNITY): Payer: Medicare Other

## 2021-11-13 DIAGNOSIS — I63312 Cerebral infarction due to thrombosis of left middle cerebral artery: Secondary | ICD-10-CM | POA: Diagnosis not present

## 2021-11-13 DIAGNOSIS — I6389 Other cerebral infarction: Secondary | ICD-10-CM | POA: Diagnosis not present

## 2021-11-13 DIAGNOSIS — U071 COVID-19: Secondary | ICD-10-CM | POA: Diagnosis not present

## 2021-11-13 DIAGNOSIS — Z8679 Personal history of other diseases of the circulatory system: Secondary | ICD-10-CM | POA: Diagnosis not present

## 2021-11-13 DIAGNOSIS — Z87898 Personal history of other specified conditions: Secondary | ICD-10-CM

## 2021-11-13 LAB — CBC WITH DIFFERENTIAL/PLATELET
Abs Immature Granulocytes: 0.01 10*3/uL (ref 0.00–0.07)
Basophils Absolute: 0 10*3/uL (ref 0.0–0.1)
Basophils Relative: 0 %
Eosinophils Absolute: 0 10*3/uL (ref 0.0–0.5)
Eosinophils Relative: 0 %
HCT: 34.8 % — ABNORMAL LOW (ref 39.0–52.0)
Hemoglobin: 12.4 g/dL — ABNORMAL LOW (ref 13.0–17.0)
Immature Granulocytes: 0 %
Lymphocytes Relative: 23 %
Lymphs Abs: 0.6 10*3/uL — ABNORMAL LOW (ref 0.7–4.0)
MCH: 29.6 pg (ref 26.0–34.0)
MCHC: 35.6 g/dL (ref 30.0–36.0)
MCV: 83.1 fL (ref 80.0–100.0)
Monocytes Absolute: 0.3 10*3/uL (ref 0.1–1.0)
Monocytes Relative: 11 %
Neutro Abs: 1.9 10*3/uL (ref 1.7–7.7)
Neutrophils Relative %: 66 %
Platelets: 103 10*3/uL — ABNORMAL LOW (ref 150–400)
RBC: 4.19 MIL/uL — ABNORMAL LOW (ref 4.22–5.81)
RDW: 12.6 % (ref 11.5–15.5)
WBC: 2.8 10*3/uL — ABNORMAL LOW (ref 4.0–10.5)
nRBC: 0 % (ref 0.0–0.2)

## 2021-11-13 LAB — COMPREHENSIVE METABOLIC PANEL
ALT: 33 U/L (ref 0–44)
AST: 59 U/L — ABNORMAL HIGH (ref 15–41)
Albumin: 3.2 g/dL — ABNORMAL LOW (ref 3.5–5.0)
Alkaline Phosphatase: 60 U/L (ref 38–126)
Anion gap: 10 (ref 5–15)
BUN: 18 mg/dL (ref 8–23)
CO2: 26 mmol/L (ref 22–32)
Calcium: 8.5 mg/dL — ABNORMAL LOW (ref 8.9–10.3)
Chloride: 106 mmol/L (ref 98–111)
Creatinine, Ser: 0.98 mg/dL (ref 0.61–1.24)
GFR, Estimated: >60 mL/min (ref >60)
Glucose, Bld: 146 mg/dL — ABNORMAL HIGH (ref 70–99)
Potassium: 2.5 mmol/L — CL (ref 3.5–5.1)
Sodium: 142 mmol/L (ref 135–145)
Total Bilirubin: 0.4 mg/dL (ref 0.3–1.2)
Total Protein: 6 g/dL — ABNORMAL LOW (ref 6.5–8.1)

## 2021-11-13 LAB — BASIC METABOLIC PANEL
Anion gap: 14 (ref 5–15)
BUN: 14 mg/dL (ref 8–23)
CO2: 26 mmol/L (ref 22–32)
Calcium: 8.3 mg/dL — ABNORMAL LOW (ref 8.9–10.3)
Chloride: 103 mmol/L (ref 98–111)
Creatinine, Ser: 0.87 mg/dL (ref 0.61–1.24)
GFR, Estimated: >60 mL/min (ref >60)
Glucose, Bld: 126 mg/dL — ABNORMAL HIGH (ref 70–99)
Potassium: 2.9 mmol/L — ABNORMAL LOW (ref 3.5–5.1)
Sodium: 143 mmol/L (ref 135–145)

## 2021-11-13 LAB — ECHOCARDIOGRAM COMPLETE
AV Vena cont: 0.3 cm
Area-P 1/2: 2.2 cm2
Calc EF: 63.3 %
Height: 72 in
P 1/2 time: 568 msec
S' Lateral: 2.3 cm
Single Plane A2C EF: 63.4 %
Single Plane A4C EF: 62 %
Weight: 2560 oz

## 2021-11-13 LAB — PHOSPHORUS: Phosphorus: 2.3 mg/dL — ABNORMAL LOW (ref 2.5–4.6)

## 2021-11-13 LAB — HIV ANTIBODY (ROUTINE TESTING W REFLEX): HIV Screen 4th Generation wRfx: NONREACTIVE

## 2021-11-13 LAB — MAGNESIUM: Magnesium: 1.7 mg/dL (ref 1.7–2.4)

## 2021-11-13 MED ORDER — POTASSIUM CHLORIDE CRYS ER 20 MEQ PO TBCR
20.0000 meq | EXTENDED_RELEASE_TABLET | Freq: Once | ORAL | Status: AC
  Administered 2021-11-13: 20 meq via ORAL
  Filled 2021-11-13: qty 1

## 2021-11-13 MED ORDER — LABETALOL HCL 100 MG PO TABS
100.0000 mg | ORAL_TABLET | Freq: Two times a day (BID) | ORAL | Status: DC
  Administered 2021-11-13 – 2021-11-14 (×3): 100 mg via ORAL
  Filled 2021-11-13 (×4): qty 1

## 2021-11-13 MED ORDER — SODIUM PHOSPHATES 45 MMOLE/15ML IV SOLN
15.0000 mmol | Freq: Once | INTRAVENOUS | Status: AC
  Administered 2021-11-13: 15 mmol via INTRAVENOUS
  Filled 2021-11-13: qty 5

## 2021-11-13 MED ORDER — IOHEXOL 350 MG/ML SOLN
75.0000 mL | Freq: Once | INTRAVENOUS | Status: AC | PRN
  Administered 2021-11-13: 75 mL via INTRAVENOUS

## 2021-11-13 MED ORDER — HYDRALAZINE HCL 20 MG/ML IJ SOLN
5.0000 mg | Freq: Four times a day (QID) | INTRAMUSCULAR | Status: DC | PRN
  Filled 2021-11-13: qty 1

## 2021-11-13 MED ORDER — ATORVASTATIN CALCIUM 10 MG PO TABS
20.0000 mg | ORAL_TABLET | Freq: Every day | ORAL | Status: DC
  Administered 2021-11-14 – 2021-11-17 (×3): 20 mg via ORAL
  Filled 2021-11-13 (×4): qty 2

## 2021-11-13 MED ORDER — MAGNESIUM SULFATE 2 GM/50ML IV SOLN
2.0000 g | Freq: Once | INTRAVENOUS | Status: AC
  Administered 2021-11-13: 2 g via INTRAVENOUS
  Filled 2021-11-13: qty 50

## 2021-11-13 MED ORDER — POTASSIUM CHLORIDE CRYS ER 20 MEQ PO TBCR
40.0000 meq | EXTENDED_RELEASE_TABLET | ORAL | Status: AC
  Administered 2021-11-13 (×3): 40 meq via ORAL
  Filled 2021-11-13 (×3): qty 2

## 2021-11-13 NOTE — Progress Notes (Signed)
STROKE TEAM PROGRESS NOTE   SUBJECTIVE (INTERVAL HISTORY) His wife is at the bedside.  Overall his condition is stable. Pt felt good today and largely at his baseline. Still has chronic spastic hemiparesis on the left. Afebrile now.    OBJECTIVE Temp:  [98.1 F (36.7 C)-99.3 F (37.4 C)] 98.3 F (36.8 C) (11/11 0846) Pulse Rate:  [63-97] 72 (11/11 0846) Cardiac Rhythm: Normal sinus rhythm (11/11 0700) Resp:  [16-25] 19 (11/11 0846) BP: (142-194)/(86-120) 181/120 (11/11 0846) SpO2:  [95 %-100 %] 98 % (11/11 0846)  Recent Labs  Lab 11/11/21 1457  GLUCAP 116*   Recent Labs  Lab 11/11/21 1430 11/11/21 1550 11/11/21 2330 11/12/21 0611 11/13/21 0157  NA 140  --   --  144 142  K 2.7*  --  2.4* 3.3* 2.5*  CL 99  --   --  103 106  CO2 29  --   --  27 26  GLUCOSE 109*  --   --  116* 146*  BUN 20  --   --  16 18  CREATININE 1.47*  --   --  1.29* 0.98  CALCIUM 8.7*  --   --  8.5* 8.5*  MG  --  2.1  --   --  1.7  PHOS  --   --   --   --  2.3*   Recent Labs  Lab 11/11/21 1430 11/12/21 0611 11/13/21 0157  AST 78* 79* 59*  ALT 32 40 33  ALKPHOS 66 67 60  BILITOT 1.1 1.1 0.4  PROT 5.9* 6.1* 6.0*  ALBUMIN 3.5 3.4* 3.2*   Recent Labs  Lab 11/11/21 1430 11/12/21 0611 11/13/21 0157  WBC 4.1 3.6* 2.8*  NEUTROABS 2.6  --  1.9  HGB 11.6* 12.6* 12.4*  HCT 34.0* 36.4* 34.8*  MCV 85.9 85.2 83.1  PLT 134* 114* 103*   No results for input(s): "CKTOTAL", "CKMB", "CKMBINDEX", "TROPONINI" in the last 168 hours. No results for input(s): "LABPROT", "INR" in the last 72 hours. Recent Labs    11/12/21 0658  COLORURINE STRAW*  LABSPEC 1.006  PHURINE 7.0  GLUCOSEU NEGATIVE  HGBUR MODERATE*  BILIRUBINUR NEGATIVE  KETONESUR 20*  PROTEINUR 30*  NITRITE NEGATIVE  LEUKOCYTESUR NEGATIVE       Component Value Date/Time   CHOL 173 09/27/2021 1528   TRIG 81 09/27/2021 1528   HDL 55 09/27/2021 1528   CHOLHDL 3.1 09/27/2021 1528   CHOLHDL 3.0 11/21/2016 0914   VLDL 21 05/16/2016  0841   LDLCALC 103 (H) 09/27/2021 1528   LDLCALC 91 11/21/2016 0914   Lab Results  Component Value Date   HGBA1C 5.7 (H) 09/27/2021      Component Value Date/Time   LABOPIA NONE DETECTED 04/20/2019 0126   COCAINSCRNUR NONE DETECTED 04/20/2019 0126   LABBENZ NONE DETECTED 04/20/2019 0126   AMPHETMU NONE DETECTED 04/20/2019 0126   THCU NONE DETECTED 04/20/2019 0126   LABBARB NONE DETECTED 04/20/2019 0126    No results for input(s): "ETH" in the last 168 hours.  I have personally reviewed the radiological images below and agree with the radiology interpretations.  MR BRAIN WO CONTRAST  Result Date: 11/12/2021 CLINICAL DATA:  Confusion, history of stroke and seizure disorder; febrile EXAM: MRI HEAD WITHOUT CONTRAST TECHNIQUE: Multiplanar, multiecho pulse sequences of the brain and surrounding structures were obtained without intravenous contrast. COMPARISON:  04/20/2019 MRI head correlation is also made with CT head 11/11/2021 FINDINGS: Evaluation is somewhat limited by motion artifact. Brain: Small focus of restricted  diffusion with ADC correlate in the left frontal white matter (series 7, image 62). Redemonstrated large area of encephalomalacia in the right cerebral hemisphere,, the site of a remote intraparenchymal hematoma. No acute hemorrhage, mass, mass effect, or midline shift. No hydrocephalus or extra-axial collection. Vascular: Normal proximal arterial flow voids. Skull and upper cervical spine: Normal marrow signal. Sinuses/Orbits: Mild mucosal thickening in the maxillary sinuses. Status post bilateral lens replacements. Other: The mastoids are well aerated. IMPRESSION: 1. Small acute infarct in the left frontal white matter. 2. Redemonstrated large area of encephalomalacia in the right cerebral hemisphere, the site of a remote intraparenchymal hematoma. These results will be called to the ordering clinician or representative by the Radiologist Assistant, and communication documented  in the PACS or [employer]. Electronically Signed   By: Merilyn Baba M.D.   On: 11/12/2021 02:46   CT CHEST WO CONTRAST  Result Date: 11/11/2021 CLINICAL DATA:  COVID-19 with hypoxia EXAM: CT CHEST WITHOUT CONTRAST TECHNIQUE: Multidetector CT imaging of the chest was performed following the standard protocol without IV contrast. RADIATION DOSE REDUCTION: This exam was performed according to the departmental dose-optimization program which includes automated exposure control, adjustment of the mA and/or kV according to patient size and/or use of iterative reconstruction technique. COMPARISON:  Radiographs 11/11/2021 FINDINGS: Cardiovascular: Mitral annular calcification. Mild coronary artery calcification. Aortic valve calcification. Normal heart size. No pericardial effusion. Mediastinum/Nodes: Unremarkable thyroid. No thoracic adenopathy. Unremarkable esophagus. Lungs/Pleura: Bilateral lower lobe atelectasis/scarring with architectural distortion and mild bronchiolectasis. Posterior mild reticular opacities. Focal thickening in the right major fissure may be a small amount of fluid. No pneumothorax. Small amount of layering secretions in the trachea. Upper Abdomen: No acute abnormality. Musculoskeletal: No chest wall mass or suspicious bone lesions identified. IMPRESSION: Chronic scarring greatest in the bilateral lower lobes. Mild amount of layering secretions in the trachea. Findings could be due to chronic aspiration. No focal pneumonia. Electronically Signed   By: Placido Sou M.D.   On: 11/11/2021 23:07   CT HEAD WO CONTRAST (5MM)  Result Date: 11/11/2021 CLINICAL DATA:  Mental status change EXAM: CT HEAD WITHOUT CONTRAST TECHNIQUE: Contiguous axial images were obtained from the base of the skull through the vertex without intravenous contrast. RADIATION DOSE REDUCTION: This exam was performed according to the departmental dose-optimization program which includes automated exposure control,  adjustment of the mA and/or kV according to patient size and/or use of iterative reconstruction technique. COMPARISON:  CT head 05/13/2009, MRI Brain 04/20/2018 FINDINGS: Brain: Redemonstrated large area of encephalomalacia in the right cerebral hemisphere in the frontoparietal region, at a site of remote intraparenchymal hemorrhage. Ex vacuo dilatation of the right ventricular atrium, unchanged from prior exam. No new hemorrhage. No midline shift. No extra-axial fluid collection. Compared to prior exam there is a new area of hypodensity abutting the left occipital horn (series 7, image 19). This was not seen on prior CT and definitively visualized on the recent brain MRI. Vascular: No hyperdense vessel or unexpected calcification. Skull: Normal. Negative for fracture or focal lesion. Sinuses/Orbits: No acute finding. Mild mucosal thickening bilateral maxillary sinus with osseous findings of chronic right maxillary sinusitis. Bilateral mastoid air cells are clear. Bilateral lens replacements. Other: None IMPRESSION: New area of hypodensity abutting the left occipital horn, which was not seen on prior CT and definitively visualized on the recent brain MRI. This may represent sequela of chronic microvascular ischemic change, but if there is clinical concern for an acute infarct, further evaluation with a brain MRI is  recommended. Electronically Signed   By: Marin Roberts M.D.   On: 11/11/2021 19:48   DG Chest Portable 1 View  Result Date: 11/11/2021 CLINICAL DATA:  , History of stroke and LEFT side deficits, altered mental status EXAM: PORTABLE CHEST 1 VIEW COMPARISON:  Portable exam 1427 hours compared to 04/20/2019 FINDINGS: Upper normal heart size. Mediastinal contours and pulmonary vascularity normal. Minimal RIGHT basilar atelectasis. Lungs otherwise clear. No infiltrate, pleural effusion, or pneumothorax. Bones demineralized. IMPRESSION: Minimal RIGHT basilar atelectasis. Electronically Signed   By: Lavonia Dana M.D.   On: 11/11/2021 14:43     PHYSICAL EXAM  Temp:  [98.1 F (36.7 C)-99.3 F (37.4 C)] 98.3 F (36.8 C) (11/11 0846) Pulse Rate:  [63-97] 72 (11/11 0846) Resp:  [16-25] 19 (11/11 0846) BP: (142-194)/(86-120) 181/120 (11/11 0846) SpO2:  [95 %-100 %] 98 % (11/11 0846)  General - Well nourished, well developed, in no apparent distress.  Ophthalmologic - fundi not visualized due to noncooperation.  Cardiovascular - Regular rhythm and rate.  Mental Status -  Level of arousal and orientation to time, place, and person were intact. Mild psychomotor slowing Language including expression, naming, repetition, comprehension was assessed and found intact but paucity of speech.  Cranial Nerves II - XII - II - Visual field intact OU. III, IV, VI - Extraocular movements intact. V - Facial sensation decreased on the left. VII - Facial movement showed weakness on the left. VIII - Hearing & vestibular intact bilaterally. X - Palate elevates symmetrically. XI - Chin turning & shoulder shrug intact bilaterally. XII - Tongue protrusion intact.  Motor Strength - The patient's strength was normal in RUE and RLE, but left UE spastic plegia, LLE able to against gravity but drift with   Reflexes - The patient's reflexes were 4+ on the LUE and LLE with sustained clonus   Sensory - Light touch, temperature/pinprick were assessed and were total loss at LUE and LLE  Coordination - The patient had normal movements in the R hand with no ataxia or dysmetria.  Tremor was absent, but sustained clonus LUE and LLE.  Gait and Station - deferred.   ASSESSMENT/PLAN Steve Burton is a 66 y.o. male with history of hypertension, hyperlipidemia, right BG ICH in 2011 with spastic hemiparesis, one-time seizure on Keppra admitted for fever, URI and confusion.  Found to have COVID-positive.  No tPA given due to outside window.    Stroke:  left frontal semiovale infarct secondary to small vessel disease  in the setting of COVID CT head questionable left occipital horn hypodensity MRI left frontal white matter small infarct CTA head and neck pending 2D Echo EF 60 to 65% LDL 103 in 09/2021 HgbA1c 5.7 in 09/2021 Lovenox for VTE prophylaxis No antithrombotic prior to admission, now on aspirin 81 mg daily.  No DAPT given history ICH and now with mild thrombocytopenia Patient counseled to be compliant with his antithrombotic medications Ongoing aggressive stroke risk factor management Therapy recommendations: Pending Disposition: Pending  COVID infection Fever 102.5 on admission, now afebrile URI symptoms On Decadron and molnupiravir Management per primary team  History of ICH and seizure Right BG ICH in 03/2009 With residual left spastic hemiplegia, follow with [facility] One-time seizure in 04/2019, MRI negative for acute finding, put on Keppra Follow-up with [facility], last EEG 05/2021 negative.  Plan to taper off [place] for now Continue follow-up with [facility] Frann Rider  Hypertension Stable Long term BP goal normotensive  Hyperlipidemia Home meds: Lipitor 10  LDL 103, goal < 70 Now on Lipitor 20 No high intensity statin given LDL not far from goal Continue statin at discharge  Other Stroke Risk Factors   Other Active Problems Elevated LFT, AST 79-> pending AKI creatinine 1.47-1.29-0.87 Thrombocytopenia platelet 114 Neutropenia WBC 3.6  Hospital day # 2   Rosalin Hawking, MD PhD Stroke Neurology 11/13/2021 11:48 AM    To contact Stroke Continuity provider, please refer to [email or web address] After hours, contact General Neurology

## 2021-11-13 NOTE — Progress Notes (Signed)
Echocardiogram 2D Echocardiogram has been performed.  Fidel Levy 11/13/2021, 11:35 AM

## 2021-11-13 NOTE — Progress Notes (Signed)
Current BP 181/120 (138)  taken bilaterally. No scheduled or prn medication available to administer. Erlinda Hong, MD paged via [phone] at this time.

## 2021-11-13 NOTE — Progress Notes (Signed)
PROGRESS NOTE    Steve Burton  IOX:[id number] DOB: [birth year] DOA: 11/11/2021 PCP: Carlena Hurl, PA-C     Brief Narrative:   hypertension, hyperlipidemia, stroke with residual left spastic hemiparesis (unable to move his left upper extremity since then but is able to walk using a walker), seizure disorder presented to the ED via EMS for evaluation of fevers, URI symptoms, confusion, and urinary incontinence.  He had a virtual visit with PCP earlier today.  His wife recently tested positive for COVID after traveling to [place].  In the ED, patient noted to be febrile with temperature as high as 102.5 F and oxygen saturation as low as 89% on room air.  He was placed on 2 L O2.  Patient is confused and slow to respond to questions.  States his wife sent him to the hospital as he was having difficulty controlling his bladder.   Subjective:  He is oriented x3 ,  confusion appears has resolved, I did not hear him coughing today He reports is feeling better  Assessment & Plan:  Principal Problem:   COVID-19 virus infection Active Problems:   Essential hypertension   Left spastic hemiparesis (HCC)   Spasticity due to old stroke   Hyperlipidemia   Acute hypoxemic respiratory failure (HCC)   Acute metabolic encephalopathy   Urinary incontinence   Normocytic anemia   Elevated transaminase level   Thrombocytopenia (HCC)   Hypokalemia   AKI (acute kidney injury) ([place])   Seizure disorder ([place])    COVID-19 viral infection/ Acute hypoxemic respiratory failure -improving -Continue molnupiravir,   Decadron, follow crp   Acute metabolic encephalopathy, likely from hypoxia, he does has acute CVA but small Urine culture in process improving  Acute CVA MRI brain 1. Small acute infarct in the left frontal white matter. 2. Redemonstrated large area of encephalomalacia in the right cerebral hemisphere, the site of a remote intraparenchymal hematoma.  neurology consulted  Allow  permissive hypertension for up to 48hrs till 2pm this afternoon, then gradually lower bp to normal tensive  Neurology consulted , input appreciated     Urinary incontinence -bladder scan 157cc -UA with rare bacteria, moderate hemoglobin, negative nitrite, negative leuks - urine culture due to confusion and urinary symptoms, f/u on result  Mild elevation of transaminase Likely due to acute viral infection. -Continue to monitor   Mild thrombocytopenia/lymphopenia Likely due to acute viral infection.   -Continue to monitor   Hypokalemia/hypophosphatemia/hypomagnesemia Replace, repeat in the morning.   AKI Resolved after hydration  -Avoid nephrotoxic agents/hold home losartan and spironolactone   Hypertension: Initially allow permissive hypertension due to acute CVA, gradually restart home BP meds Hyperlipidemia; continue statin History of stroke with residual left spastic hemiparesis, does not appear to be antiplatelet at home Seizure disorder, resume keppra  FTT, wheelchair bound since 2011  I have Reviewed nursing notes, Vitals, pain scores, I/o's, Lab results and  imaging results since pt's last encounter, details please see discussion above  I ordered the following labs:  Unresulted Labs (From admission, onward)     Start     Ordered   11/14/21 0500  C-reactive protein  Daily at 5am,   R      11/13/21 0728   11/14/21 2683  Basic metabolic panel  Daily at 5am,   R      11/13/21 0728   11/14/21 0500  Phosphorus  Daily at 5am,   R      11/13/21 0728   11/14/21 0500  Magnesium  Tomorrow morning,   R        11/13/21 0728   11/14/21 0500  CBC with Differential/Platelet  Tomorrow morning,   R        11/13/21 1830   11/14/21 0500  Hepatic function panel  Tomorrow morning,   R        11/13/21 1830   11/12/21 1843  Urine Culture  (Urine Culture)  Once,   R       Question:  Indication  Answer:  Altered mental status (if no other cause identified)   11/12/21 1842              DVT prophylaxis: enoxaparin (LOVENOX) injection 40 mg Start: 11/11/21 2200   Code Status:   Code Status: Full Code  Family Communication: wife cell phone listed on chart is not correct, home phone number is correct wife updated over the phone on 11/11 and 11/12 Disposition:    Dispo: The patient is from: home              Anticipated d/c is to: need PT eval              Anticipated d/c date is: monitor bp, potassium, f/u on urine culture,  likely  d/c on monday  Antimicrobials:   Anti-infectives (From admission, onward)    Start     Dose/Rate Route Frequency Ordered Stop   11/11/21 2359  molnupiravir EUA (LAGEVRIO) capsule 800 mg        4 capsule Oral 2 times daily 11/11/21 2020 11/16/21 2159           Objective: Vitals:   11/13/21 0450 11/13/21 0846 11/13/21 1210 11/13/21 1445  BP: (!) 171/99 (!) 181/120 (!) 189/109 (!) 151/102  Pulse: 63 72 68 69  Resp: '16 19 18   '$ Temp: 98.4 F (36.9 C) 98.3 F (36.8 C) 98.3 F (36.8 C)   TempSrc: Oral Oral Oral   SpO2: 95% 98% 98%   Weight:      Height:        Intake/Output Summary (Last 24 hours) at 11/13/2021 1831 Last data filed at 11/13/2021 1034 Gross per 24 hour  Intake --  Output 2450 ml  Net -2450 ml   Filed Weights   11/11/21 1417  Weight: 72.6 kg    Examination:  General exam: aaox3, confusion appear has resolved Respiratory system: Clear to auscultation. Respiratory effort normal. Cardiovascular system:  RRR.  Gastrointestinal system: Abdomen is nondistended, soft and nontender.  Normal bowel sounds heard. Central nervous system: AAOx3, left-sided weakness from prior CVA. Extremities:  no edema Skin: No rashes, lesions or ulcers Psychiatry: Pleasant, no agitation.     Data Reviewed: I have personally reviewed  labs and visualized  imaging studies since the last encounter and formulate the plan        Scheduled Meds:  aspirin EC  81 mg Oral Daily   atorvastatin  10 mg Oral Daily    dexamethasone (DECADRON) injection  6 mg Intravenous Q24H   enoxaparin (LOVENOX) injection  40 mg Subcutaneous Q24H   labetalol  100 mg Oral BID   levETIRAcetam  500 mg Oral BID   molnupiravir EUA  4 capsule Oral BID   Continuous Infusions:  sodium chloride 1,000 mL (11/12/21 2345)     LOS: 2 days     Florencia Reasons, MD PhD FACP [facility]  Available via Epic secure chat 7am-7pm for nonurgent issues Please page for urgent issues To page the attending provider  between 7A-7P or the covering provider during after hours 7P-7A, please log into the web site [email or web address] and access using universal [place] password for that web site. If you do not have the password, please call the hospital operator.    11/13/2021, 6:31 PM

## 2021-11-14 DIAGNOSIS — U071 COVID-19: Secondary | ICD-10-CM | POA: Diagnosis not present

## 2021-11-14 DIAGNOSIS — I63312 Cerebral infarction due to thrombosis of left middle cerebral artery: Secondary | ICD-10-CM | POA: Diagnosis not present

## 2021-11-14 DIAGNOSIS — Z8679 Personal history of other diseases of the circulatory system: Secondary | ICD-10-CM | POA: Diagnosis not present

## 2021-11-14 LAB — CBC WITH DIFFERENTIAL/PLATELET
Abs Immature Granulocytes: 0.04 10*3/uL (ref 0.00–0.07)
Basophils Absolute: 0 10*3/uL (ref 0.0–0.1)
Basophils Relative: 0 %
Eosinophils Absolute: 0 10*3/uL (ref 0.0–0.5)
Eosinophils Relative: 0 %
HCT: 36.3 % — ABNORMAL LOW (ref 39.0–52.0)
Hemoglobin: 12.6 g/dL — ABNORMAL LOW (ref 13.0–17.0)
Immature Granulocytes: 1 %
Lymphocytes Relative: 14 %
Lymphs Abs: 0.8 10*3/uL (ref 0.7–4.0)
MCH: 28.9 pg (ref 26.0–34.0)
MCHC: 34.7 g/dL (ref 30.0–36.0)
MCV: 83.3 fL (ref 80.0–100.0)
Monocytes Absolute: 0.3 10*3/uL (ref 0.1–1.0)
Monocytes Relative: 6 %
Neutro Abs: 4.3 10*3/uL (ref 1.7–7.7)
Neutrophils Relative %: 79 %
Platelets: 100 10*3/uL — ABNORMAL LOW (ref 150–400)
RBC: 4.36 MIL/uL (ref 4.22–5.81)
RDW: 12.7 % (ref 11.5–15.5)
WBC: 5.4 10*3/uL (ref 4.0–10.5)
nRBC: 0 % (ref 0.0–0.2)

## 2021-11-14 LAB — MAGNESIUM: Magnesium: 1.8 mg/dL (ref 1.7–2.4)

## 2021-11-14 LAB — URINE CULTURE: Culture: <10000 — AB

## 2021-11-14 LAB — HEPATIC FUNCTION PANEL
ALT: 35 U/L (ref 0–44)
AST: 47 U/L — ABNORMAL HIGH (ref 15–41)
Albumin: 3.2 g/dL — ABNORMAL LOW (ref 3.5–5.0)
Alkaline Phosphatase: 71 U/L (ref 38–126)
Bilirubin, Direct: 0.1 mg/dL (ref 0.0–0.2)
Indirect Bilirubin: 0.3 mg/dL (ref 0.3–0.9)
Total Bilirubin: 0.4 mg/dL (ref 0.3–1.2)
Total Protein: 5.9 g/dL — ABNORMAL LOW (ref 6.5–8.1)

## 2021-11-14 LAB — BASIC METABOLIC PANEL
Anion gap: 9 (ref 5–15)
BUN: 16 mg/dL (ref 8–23)
CO2: 28 mmol/L (ref 22–32)
Calcium: 8.4 mg/dL — ABNORMAL LOW (ref 8.9–10.3)
Chloride: 105 mmol/L (ref 98–111)
Creatinine, Ser: 0.81 mg/dL (ref 0.61–1.24)
GFR, Estimated: >60 mL/min (ref >60)
Glucose, Bld: 133 mg/dL — ABNORMAL HIGH (ref 70–99)
Potassium: 2.6 mmol/L — CL (ref 3.5–5.1)
Sodium: 142 mmol/L (ref 135–145)

## 2021-11-14 LAB — C-REACTIVE PROTEIN: CRP: 1 mg/dL — ABNORMAL HIGH (ref <1.0)

## 2021-11-14 LAB — PHOSPHORUS: Phosphorus: 3.2 mg/dL (ref 2.5–4.6)

## 2021-11-14 MED ORDER — LOSARTAN POTASSIUM 50 MG PO TABS
50.0000 mg | ORAL_TABLET | Freq: Every day | ORAL | Status: DC
  Administered 2021-11-14: 50 mg via ORAL
  Filled 2021-11-14: qty 1

## 2021-11-14 MED ORDER — TAMSULOSIN HCL 0.4 MG PO CAPS
0.4000 mg | ORAL_CAPSULE | Freq: Every day | ORAL | Status: DC
  Administered 2021-11-14 – 2021-11-17 (×3): 0.4 mg via ORAL
  Filled 2021-11-14 (×4): qty 1

## 2021-11-14 MED ORDER — POTASSIUM CHLORIDE 20 MEQ PO PACK
40.0000 meq | PACK | Freq: Two times a day (BID) | ORAL | Status: DC
  Administered 2021-11-14 (×2): 40 meq via ORAL
  Filled 2021-11-14 (×2): qty 2

## 2021-11-14 MED ORDER — DILTIAZEM HCL ER COATED BEADS 240 MG PO CP24: 240.0000 mg | ORAL_CAPSULE | Freq: Every day | ORAL | Status: DC

## 2021-11-14 MED ORDER — LOSARTAN POTASSIUM 50 MG PO TABS
100.0000 mg | ORAL_TABLET | Freq: Every day | ORAL | Status: DC
  Administered 2021-11-16 – 2021-11-17 (×2): 100 mg via ORAL
  Filled 2021-11-14 (×3): qty 2

## 2021-11-14 MED ORDER — POTASSIUM CHLORIDE CRYS ER 20 MEQ PO TBCR
40.0000 meq | EXTENDED_RELEASE_TABLET | Freq: Once | ORAL | Status: AC
  Administered 2021-11-14: 40 meq via ORAL
  Filled 2021-11-14: qty 2

## 2021-11-14 MED ORDER — LABETALOL HCL 200 MG PO TABS
200.0000 mg | ORAL_TABLET | Freq: Two times a day (BID) | ORAL | Status: DC
  Administered 2021-11-14 – 2021-11-17 (×5): 200 mg via ORAL
  Filled 2021-11-14 (×8): qty 1

## 2021-11-14 MED ORDER — POTASSIUM CHLORIDE 10 MEQ/100ML IV SOLN
10.0000 meq | INTRAVENOUS | Status: AC
  Administered 2021-11-14 (×2): 10 meq via INTRAVENOUS
  Filled 2021-11-14 (×2): qty 100

## 2021-11-14 MED ORDER — LOSARTAN POTASSIUM 50 MG PO TABS
50.0000 mg | ORAL_TABLET | Freq: Once | ORAL | Status: AC
  Administered 2021-11-14: 50 mg via ORAL
  Filled 2021-11-14: qty 1

## 2021-11-14 MED ORDER — MAGNESIUM SULFATE 2 GM/50ML IV SOLN
2.0000 g | Freq: Once | INTRAVENOUS | Status: AC
  Administered 2021-11-14: 2 g via INTRAVENOUS
  Filled 2021-11-14: qty 50

## 2021-11-14 MED ORDER — POTASSIUM CHLORIDE CRYS ER 20 MEQ PO TBCR
40.0000 meq | EXTENDED_RELEASE_TABLET | Freq: Two times a day (BID) | ORAL | Status: DC
  Administered 2021-11-14: 40 meq via ORAL
  Filled 2021-11-14: qty 2

## 2021-11-14 NOTE — Evaluation (Signed)
Physical Therapy Evaluation Patient Details Name: Steve Burton MRN: [id number] DOB: 10-18-1955 Today's Date: 11/14/2021  History of Present Illness  66 y.o. male presented to the ED 11/11/21 via EMS for evaluation of fevers, URI symptoms, confusion, and urinary incontinence. +COVID; MRI left frontal white matter small infarct; with medical history significant of hypertension, hyperlipidemia, stroke with residual left spastic hemiparesis, seizure disorder  Clinical Impression   Pt admitted secondary to problem above with deficits below. PTA patient was living with wife as his only caregiver. He was primarily using a wheelchair at a modified independent level with transfers and propulsion. He only walked when wife could follow him with the wheelchair and at that time used quad cane and Left AFO.  Pt currently requires constant supervision due to his impulsivity (which is baseline for him per wife) and min assist for transfer simulating bed to wheelchair (using recliner). Discussed with wife that he currently needs very little help with transfer and she is very concerned on how he will do with his ADLs now. She is wondering how much help he will need for these and if she'll be able to provide the level of assist he needs at this time. Anticipate pt will progress to level she can manage at home, however will know more once OT evaluates. If wife cannot manage him at home, would recommend SNF for further therapies.  Anticipate patient will benefit from PT to address problems listed below.Will continue to follow acutely to maximize functional mobility independence and safety.          Recommendations for follow up therapy are one component of a multi-disciplinary discharge planning process, led by the attending physician.  Recommendations may be updated based on patient status, additional functional criteria and insurance authorization.  Follow Up Recommendations Home health PT (however wife concerned she  may not be able to care for him at current level (min assist for transfer); await OT eval to further define his care needs)      Assistance Recommended at Discharge Frequent or constant Supervision/Assistance  Patient can return home with the following  A little help with walking and/or transfers;Assistance with cooking/housework;Direct supervision/assist for medications management;Direct supervision/assist for financial management;Assist for transportation;Help with stairs or ramp for entrance    Equipment Recommendations None recommended by PT  Recommendations for Other Services  OT consult    Functional Status Assessment Patient has had a recent decline in their functional status and demonstrates the ability to make significant improvements in function in a reasonable and predictable amount of time.     Precautions / Restrictions Precautions Precautions: Fall Restrictions Weight Bearing Restrictions: No      Mobility  Bed Mobility Overal bed mobility: Needs Assistance Bed Mobility: Sidelying to Sit   Sidelying to sit: Supervision       General bed mobility comments: HOB elevated and use of rail; incr time to scoot out to get feet on floor but no assist needed    Transfers Overall transfer level: Needs assistance Equipment used:  (bedrail and armrest of chair) Transfers: Sit to/from Stand, Bed to chair/wheelchair/BSC Sit to Stand: Min assist     Squat pivot transfers: Min assist     General transfer comment: came to full standing holding onto bedrail in his RUE; maintains balance shifted over his RLE with min guard assist; transitioned hand (with cues) to far armrest as pivoting to sit in chair    Ambulation/Gait  General Gait Details: deferred as pt does not have AFO or quad cane present  Stairs            Wheelchair Mobility    Modified Rankin (Stroke Patients Only) Modified Rankin (Stroke Patients Only) Pre-Morbid Rankin Score:  Moderately severe disability Modified Rankin: Moderately severe disability     Balance Overall balance assessment: Needs assistance Sitting-balance support: No upper extremity supported, Feet supported Sitting balance-Leahy Scale: Good Sitting balance - Comments: Able to anteriorly shift weight forward to unload hips and scoot out to EOB; comes back up to upright without assist or difficulty   Standing balance support: Single extremity supported, During functional activity, Reliant on assistive device for balance Standing balance-Leahy Scale: Poor Standing balance comment: requires RUE support to maintain standing and has weight shifted over RLE with minimal weight thru LLE                             Pertinent Vitals/Pain Pain Assessment Pain Assessment: No/denies pain    Home Living Family/patient expects to be discharged to:: Private residence Living Arrangements: Spouse/significant other Available Help at Discharge: Family;Available 24 hours/day Type of Home: House Home Access: Ramped entrance       Home Layout: One level Home Equipment: Cane - quad;BSC/3in1;Tub bench;Wheelchair - manual      Prior Function Prior Level of Function : Needs assist             Mobility Comments: per wife, was transferring independently in/out of wheelchair and self-propels wheelchair; he walks with quad cane, left AFO and close follow with wheelchair, but primarily uses wheelchair for locomotion inside and outside home ADLs Comments: reports she supervises his transfer into shower (using tub bench) and then he can shower himself; he independently uses urinal; she prepares his meds in med box, but then he takes them independently     Hand Dominance        Extremity/Trunk Assessment   Upper Extremity Assessment Upper Extremity Assessment: Defer to OT evaluation (Left UE spastic hemiplegia in flexed and fisted position; per wife has splint he sleeps in and she will bring it  in to hospital)    Lower Extremity Assessment Lower Extremity Assessment: LLE deficits/detail LLE Deficits / Details: incr tone/spasticity into plantarflexion and supination. Wife reports only wears AFO when walking and takes it off at all other times. Wears a splint at night    Cervical / Trunk Assessment Cervical / Trunk Assessment: Kyphotic  Communication   Communication: HOH  Cognition Arousal/Alertness: Awake/alert Behavior During Therapy: Impulsive (wife reports this is his baseline since CVA) Overall Cognitive Status: Impaired/Different from baseline                                 General Comments: per wife, he is not thinking clearly and ?would think to use urinal (currently has primofit and discussed with RN trial of using urinal as he does this at home independently); had not realized he had not had breakfast or even that he needed to order breakfast (RN to put him on automatic trays)        General Comments General comments (skin integrity, edema, etc.): Wife present and asking many questions about how much care he will need at home (especially related to ADLs). She mentioned she may not be able to care for him in his current state.    Exercises  Assessment/Plan    PT Assessment Patient needs continued PT services  PT Problem List Decreased strength;Decreased range of motion;Decreased balance;Decreased mobility;Decreased coordination;Decreased cognition;Decreased safety awareness;Decreased knowledge of precautions;Impaired sensation;Impaired tone       PT Treatment Interventions DME instruction;Gait training;Functional mobility training;Therapeutic activities;Therapeutic exercise;Balance training;Neuromuscular re-education;Cognitive remediation;Patient/family education    PT Goals (Current goals can be found in the Care Plan section)  Acute Rehab PT Goals Patient Stated Goal: none stated PT Goal Formulation: With patient/family Time For Goal  Achievement: 11/28/21 Potential to Achieve Goals: Good    Frequency Min 3X/week     Co-evaluation               AM-PAC PT "6 Clicks" Mobility  Outcome Measure Help needed turning from your back to your side while in a flat bed without using bedrails?: A Little Help needed moving from lying on your back to sitting on the side of a flat bed without using bedrails?: A Little Help needed moving to and from a bed to a chair (including a wheelchair)?: A Little Help needed standing up from a chair using your arms (e.g., wheelchair or bedside chair)?: A Little Help needed to walk in hospital room?: Total Help needed climbing 3-5 steps with a railing? : Total 6 Click Score: 14    End of Session Equipment Utilized During Treatment: Gait belt Activity Tolerance: Patient tolerated treatment well Patient left: in chair;with call bell/phone within reach;with chair alarm set;with family/visitor present Nurse Communication: Mobility status;Other (comment) (transfer to his good side for back to bed) PT Visit Diagnosis: Unsteadiness on feet (R26.81);Other abnormalities of gait and mobility (R26.89)    Time: 2956-2130 PT Time Calculation (min) (ACUTE ONLY): 28 min   Charges:   PT Evaluation $PT Eval Moderate Complexity: 1 Mod PT Treatments $Therapeutic Activity: 8-22 mins         Arby Barrette, PT Acute Rehabilitation Services  Office [phone]   Rexanne Mano 11/14/2021, 10:45 AM

## 2021-11-14 NOTE — Progress Notes (Signed)
STROKE TEAM PROGRESS NOTE   SUBJECTIVE (INTERVAL HISTORY) His wife is at the bedside.  Pt lying in bed, no acute event overnight. K 2.6 this am, getting supplement. PT recommend HH.   OBJECTIVE Temp:  [97.8 F (36.6 C)-98.6 F (37 C)] 98.1 F (36.7 C) (11/12 0345) Pulse Rate:  [60-69] 67 (11/12 0944) Cardiac Rhythm: Normal sinus rhythm (11/12 0700) Resp:  [16-20] 16 (11/12 0944) BP: (151-188)/(85-127) 188/127 (11/12 0944) SpO2:  [95 %-99 %] 98 % (11/12 1102)  Recent Labs  Lab 11/11/21 1457  GLUCAP 116*   Recent Labs  Lab 11/11/21 1430 11/11/21 1550 11/11/21 2330 11/12/21 0611 11/13/21 0157 11/13/21 1453 11/14/21 0306  NA 140  --   --  144 142 143 142  K 2.7*  --  2.4* 3.3* 2.5* 2.9* 2.6*  CL 99  --   --  103 106 103 105  CO2 29  --   --  '27 26 26 28  '$ GLUCOSE 109*  --   --  116* 146* 126* 133*  BUN 20  --   --  '16 18 14 16  '$ CREATININE 1.47*  --   --  1.29* 0.98 0.87 0.81  CALCIUM 8.7*  --   --  8.5* 8.5* 8.3* 8.4*  MG  --  2.1  --   --  1.7  --  1.8  PHOS  --   --   --   --  2.3*  --  3.2   Recent Labs  Lab 11/11/21 1430 11/12/21 0611 11/13/21 0157 11/14/21 0306  AST 78* 79* 59* 47*  ALT 32 40 33 35  ALKPHOS 66 67 60 71  BILITOT 1.1 1.1 0.4 0.4  PROT 5.9* 6.1* 6.0* 5.9*  ALBUMIN 3.5 3.4* 3.2* 3.2*   Recent Labs  Lab 11/11/21 1430 11/12/21 0611 11/13/21 0157 11/14/21 0306  WBC 4.1 3.6* 2.8* 5.4  NEUTROABS 2.6  --  1.9 4.3  HGB 11.6* 12.6* 12.4* 12.6*  HCT 34.0* 36.4* 34.8* 36.3*  MCV 85.9 85.2 83.1 83.3  PLT 134* 114* 103* 100*   No results for input(s): "CKTOTAL", "CKMB", "CKMBINDEX", "TROPONINI" in the last 168 hours. No results for input(s): "LABPROT", "INR" in the last 72 hours. Recent Labs    11/12/21 0658  COLORURINE STRAW*  LABSPEC 1.006  PHURINE 7.0  GLUCOSEU NEGATIVE  HGBUR MODERATE*  BILIRUBINUR NEGATIVE  KETONESUR 20*  PROTEINUR 30*  NITRITE NEGATIVE  LEUKOCYTESUR NEGATIVE       Component Value Date/Time   CHOL 173  09/27/2021 1528   TRIG 81 09/27/2021 1528   HDL 55 09/27/2021 1528   CHOLHDL 3.1 09/27/2021 1528   CHOLHDL 3.0 11/21/2016 0914   VLDL 21 05/16/2016 0841   LDLCALC 103 (H) 09/27/2021 1528   LDLCALC 91 11/21/2016 0914   Lab Results  Component Value Date   HGBA1C 5.7 (H) 09/27/2021      Component Value Date/Time   LABOPIA NONE DETECTED 04/20/2019 0126   COCAINSCRNUR NONE DETECTED 04/20/2019 0126   LABBENZ NONE DETECTED 04/20/2019 0126   AMPHETMU NONE DETECTED 04/20/2019 0126   THCU NONE DETECTED 04/20/2019 0126   LABBARB NONE DETECTED 04/20/2019 0126    No results for input(s): "ETH" in the last 168 hours.  I have personally reviewed the radiological images below and agree with the radiology interpretations.  CT ANGIO HEAD NECK W WO CM  Result Date: 11/13/2021 CLINICAL DATA:  Stroke/TIA EXAM: CT ANGIOGRAPHY HEAD AND NECK TECHNIQUE: Multidetector CT imaging of the head and neck  was performed using the standard protocol during bolus administration of intravenous contrast. Multiplanar CT image reconstructions and MIPs were obtained to evaluate the vascular anatomy. Carotid stenosis measurements (when applicable) are obtained utilizing NASCET criteria, using the distal internal carotid diameter as the denominator. RADIATION DOSE REDUCTION: This exam was performed according to the departmental dose-optimization program which includes automated exposure control, adjustment of the mA and/or kV according to patient size and/or use of iterative reconstruction technique. CONTRAST:  53m OMNIPAQUE IOHEXOL 350 MG/ML SOLN COMPARISON:  None Available. FINDINGS: CT HEAD FINDINGS Brain: There is no mass, hemorrhage or extra-axial collection. Old right hemisphere encephalomalacia with ex vacuo dilatation of the right lateral ventricle. There is hypoattenuation of the periventricular white matter, most commonly indicating chronic ischemic microangiopathy. Skull: The visualized skull base, calvarium and  extracranial soft tissues are normal. Sinuses/Orbits: No fluid levels or advanced mucosal thickening of the visualized paranasal sinuses. No mastoid or middle ear effusion. The orbits are normal. CTA NECK FINDINGS SKELETON: There is no bony spinal canal stenosis. No lytic or blastic lesion. OTHER NECK: Normal pharynx, larynx and major salivary glands. No cervical lymphadenopathy. Unremarkable thyroid gland. UPPER CHEST: No pneumothorax or pleural effusion. No nodules or masses. AORTIC ARCH: There is no calcific atherosclerosis of the aortic arch. There is no aneurysm, dissection or hemodynamically significant stenosis of the visualized portion of the aorta. Conventional 3 vessel aortic branching pattern. The visualized proximal subclavian arteries are widely patent. RIGHT CAROTID SYSTEM: Normal without aneurysm, dissection or stenosis. LEFT CAROTID SYSTEM: Normal without aneurysm, dissection or stenosis. VERTEBRAL ARTERIES: Left dominant configuration. Both origins are clearly patent. There is no dissection, occlusion or flow-limiting stenosis to the skull base (V1-V3 segments). CTA HEAD FINDINGS POSTERIOR CIRCULATION: --Vertebral arteries: Normal V4 segments. --Inferior cerebellar arteries: Normal. --Basilar artery: Normal. --Superior cerebellar arteries: Normal. --Posterior cerebral arteries (PCA): Normal. ANTERIOR CIRCULATION: --Intracranial internal carotid arteries: Normal. --Anterior cerebral arteries (ACA): Normal. Both A1 segments are present. Patent anterior communicating artery (a-comm). --Middle cerebral arteries (MCA): Normal. VENOUS SINUSES: As permitted by contrast timing, patent. ANATOMIC VARIANTS: Fetal origin of the left posterior cerebral artery. Review of the MIP images confirms the above findings. IMPRESSION: 1. No emergent large vessel occlusion or hemodynamically significant stenosis of the head or neck. 2. Old right hemisphere encephalomalacia and findings of chronic ischemic microangiopathy.  Electronically Signed   By: KUlyses JarredM.D.   On: 11/13/2021 20:30   ECHOCARDIOGRAM COMPLETE  Result Date: 11/13/2021    ECHOCARDIOGRAM REPORT   Patient Name:   BHATIM HOMANNDate of Exam: 11/13/2021 Medical Rec #:  [id number]   Height:       72.0 in Accession #:    [id number]  Weight:       160.0 lb Date of Birth:  [birth year]   BSA:          1.938 m Patient Age:    89years     BP:           181/120 mmHg Patient Gender: M            HR:           75 bpm. Exam Location:  Inpatient Procedure: 2D Echo, Cardiac Doppler and Color Doppler Indications:    Stroke I63.9  History:        Patient has prior history of Echocardiogram examinations, most                 recent 03/28/2019. Stroke; Risk Factors:Hypertension and  Dyslipidemia.  Sonographer:    Bernadene Person RDCS Referring Phys: [id number] Rosalin Hawking  Sonographer Comments: Image acquisition challenging due to respiratory motion. IMPRESSIONS  1. Left ventricular ejection fraction, by estimation, is 60 to 65%. Left ventricular ejection fraction by 2D MOD biplane is 63.3 %. The left ventricle has normal function. The left ventricle has no regional wall motion abnormalities. Left ventricular diastolic parameters are consistent with Grade I diastolic dysfunction (impaired relaxation).  2. Right ventricular systolic function is normal. The right ventricular size is normal. There is normal pulmonary artery systolic pressure. The estimated right ventricular systolic pressure is 29.5 mmHg.  3. The mitral valve is abnormal. Trivial mitral valve regurgitation.  4. The aortic valve is tricuspid. Aortic valve regurgitation is mild. Aortic valve sclerosis/calcification is present, without any evidence of aortic stenosis. Aortic regurgitation PHT measures 568 msec.  5. The inferior vena cava is normal in size with greater than 50% respiratory variability, suggesting right atrial pressure of 3 mmHg. Comparison(s): Changes from prior study are noted. 03/08/2019:  LVEF 60-65%, trivial AI. FINDINGS  Left Ventricle: Left ventricular ejection fraction, by estimation, is 60 to 65%. Left ventricular ejection fraction by 2D MOD biplane is 63.3 %. The left ventricle has normal function. The left ventricle has no regional wall motion abnormalities. The left ventricular internal cavity size was normal in size. There is no left ventricular hypertrophy. Left ventricular diastolic parameters are consistent with Grade I diastolic dysfunction (impaired relaxation). Indeterminate filling pressures. Right Ventricle: The right ventricular size is normal. No increase in right ventricular wall thickness. Right ventricular systolic function is normal. There is normal pulmonary artery systolic pressure. The tricuspid regurgitant velocity is 2.31 m/s, and  with an assumed right atrial pressure of 3 mmHg, the estimated right ventricular systolic pressure is 62.1 mmHg. Left Atrium: Left atrial size was normal in size. Right Atrium: Right atrial size was normal in size. Pericardium: There is no evidence of pericardial effusion. Mitral Valve: The mitral valve is abnormal. Mild to moderate mitral annular calcification. Trivial mitral valve regurgitation. Tricuspid Valve: The tricuspid valve is grossly normal. Tricuspid valve regurgitation is trivial. Aortic Valve: The aortic valve is tricuspid. Aortic valve regurgitation is mild. Aortic regurgitation PHT measures 568 msec. Aortic valve sclerosis/calcification is present, without any evidence of aortic stenosis. Pulmonic Valve: The pulmonic valve was normal in structure. Pulmonic valve regurgitation is not visualized. Aorta: The aortic root and ascending aorta are structurally normal, with no evidence of dilitation. Venous: The inferior vena cava is normal in size with greater than 50% respiratory variability, suggesting right atrial pressure of 3 mmHg. IAS/Shunts: No atrial level shunt detected by color flow Doppler.  LEFT VENTRICLE PLAX 2D                         Biplane EF (MOD) LVIDd:         3.90 cm         LV Biplane EF:   Left LVIDs:         2.30 cm                          ventricular LV PW:         1.00 cm                          ejection LV IVS:        1.00 cm  fraction by LVOT diam:     2.20 cm                          2D MOD LV SV:         90                               biplane is LV SV Index:   47                               63.3 %. LVOT Area:     3.80 cm                                Diastology                                LV e' medial:    6.60 cm/s LV Volumes (MOD)               LV E/e' medial:  14.4 LV vol d, MOD    86.6 ml       LV e' lateral:   7.43 cm/s A2C:                           LV E/e' lateral: 12.8 LV vol d, MOD    83.6 ml A4C: LV vol s, MOD    31.7 ml A2C: LV vol s, MOD    31.8 ml A4C: LV SV MOD A2C:   54.9 ml LV SV MOD A4C:   83.6 ml LV SV MOD BP:    56.5 ml RIGHT VENTRICLE RV S prime:     15.20 cm/s TAPSE (M-mode): 2.6 cm LEFT ATRIUM             Index        RIGHT ATRIUM           Index LA diam:        4.00 cm 2.06 cm/m   RA Area:     13.10 cm LA Vol (A2C):   50.0 ml 25.80 ml/m  RA Volume:   29.30 ml  15.12 ml/m LA Vol (A4C):   52.5 ml 27.09 ml/m LA Biplane Vol: 51.4 ml 26.52 ml/m  AORTIC VALVE LVOT Vmax:         109.00 cm/s LVOT Vmean:        74.500 cm/s LVOT VTI:          0.238 m AI PHT:            568 msec AR Vena Contracta: 0.30 cm  AORTA Ao Root diam: 3.50 cm Ao Asc diam:  3.50 cm MITRAL VALVE                TRICUSPID VALVE MV Area (PHT): 2.20 cm     TR Peak grad:   21.3 mmHg MV Decel Time: 345 msec     TR Vmax:        231.00 cm/s MV E velocity: 95.30 cm/s MV A velocity: 110.00 cm/s  SHUNTS MV E/A ratio:  0.87         Systemic VTI:  0.24 m  Systemic Diam: 2.20 cm Lyman Bishop MD Electronically signed by Lyman Bishop MD Signature Date/Time: 11/13/2021/4:20:39 PM    Final    MR BRAIN WO CONTRAST  Result Date: 11/12/2021 CLINICAL DATA:  Confusion, history of  stroke and seizure disorder; febrile EXAM: MRI HEAD WITHOUT CONTRAST TECHNIQUE: Multiplanar, multiecho pulse sequences of the brain and surrounding structures were obtained without intravenous contrast. COMPARISON:  04/20/2019 MRI head correlation is also made with CT head 11/11/2021 FINDINGS: Evaluation is somewhat limited by motion artifact. Brain: Small focus of restricted diffusion with ADC correlate in the left frontal white matter (series 7, image 62). Redemonstrated large area of encephalomalacia in the right cerebral hemisphere,, the site of a remote intraparenchymal hematoma. No acute hemorrhage, mass, mass effect, or midline shift. No hydrocephalus or extra-axial collection. Vascular: Normal proximal arterial flow voids. Skull and upper cervical spine: Normal marrow signal. Sinuses/Orbits: Mild mucosal thickening in the maxillary sinuses. Status post bilateral lens replacements. Other: The mastoids are well aerated. IMPRESSION: 1. Small acute infarct in the left frontal white matter. 2. Redemonstrated large area of encephalomalacia in the right cerebral hemisphere, the site of a remote intraparenchymal hematoma. These results will be called to the ordering clinician or representative by the Radiologist Assistant, and communication documented in the PACS or [employer]. Electronically Signed   By: Merilyn Baba M.D.   On: 11/12/2021 02:46   CT CHEST WO CONTRAST  Result Date: 11/11/2021 CLINICAL DATA:  COVID-19 with hypoxia EXAM: CT CHEST WITHOUT CONTRAST TECHNIQUE: Multidetector CT imaging of the chest was performed following the standard protocol without IV contrast. RADIATION DOSE REDUCTION: This exam was performed according to the departmental dose-optimization program which includes automated exposure control, adjustment of the mA and/or kV according to patient size and/or use of iterative reconstruction technique. COMPARISON:  Radiographs 11/11/2021 FINDINGS: Cardiovascular: Mitral annular  calcification. Mild coronary artery calcification. Aortic valve calcification. Normal heart size. No pericardial effusion. Mediastinum/Nodes: Unremarkable thyroid. No thoracic adenopathy. Unremarkable esophagus. Lungs/Pleura: Bilateral lower lobe atelectasis/scarring with architectural distortion and mild bronchiolectasis. Posterior mild reticular opacities. Focal thickening in the right major fissure may be a small amount of fluid. No pneumothorax. Small amount of layering secretions in the trachea. Upper Abdomen: No acute abnormality. Musculoskeletal: No chest wall mass or suspicious bone lesions identified. IMPRESSION: Chronic scarring greatest in the bilateral lower lobes. Mild amount of layering secretions in the trachea. Findings could be due to chronic aspiration. No focal pneumonia. Electronically Signed   By: Placido Sou M.D.   On: 11/11/2021 23:07   CT HEAD WO CONTRAST (5MM)  Result Date: 11/11/2021 CLINICAL DATA:  Mental status change EXAM: CT HEAD WITHOUT CONTRAST TECHNIQUE: Contiguous axial images were obtained from the base of the skull through the vertex without intravenous contrast. RADIATION DOSE REDUCTION: This exam was performed according to the departmental dose-optimization program which includes automated exposure control, adjustment of the mA and/or kV according to patient size and/or use of iterative reconstruction technique. COMPARISON:  CT head 05/13/2009, MRI Brain 04/20/2018 FINDINGS: Brain: Redemonstrated large area of encephalomalacia in the right cerebral hemisphere in the frontoparietal region, at a site of remote intraparenchymal hemorrhage. Ex vacuo dilatation of the right ventricular atrium, unchanged from prior exam. No new hemorrhage. No midline shift. No extra-axial fluid collection. Compared to prior exam there is a new area of hypodensity abutting the left occipital horn (series 7, image 19). This was not seen on prior CT and definitively visualized on the recent brain  MRI. Vascular: No hyperdense vessel  or unexpected calcification. Skull: Normal. Negative for fracture or focal lesion. Sinuses/Orbits: No acute finding. Mild mucosal thickening bilateral maxillary sinus with osseous findings of chronic right maxillary sinusitis. Bilateral mastoid air cells are clear. Bilateral lens replacements. Other: None IMPRESSION: New area of hypodensity abutting the left occipital horn, which was not seen on prior CT and definitively visualized on the recent brain MRI. This may represent sequela of chronic microvascular ischemic change, but if there is clinical concern for an acute infarct, further evaluation with a brain MRI is recommended. Electronically Signed   By: Marin Roberts M.D.   On: 11/11/2021 19:48   DG Chest Portable 1 View  Result Date: 11/11/2021 CLINICAL DATA:  , History of stroke and LEFT side deficits, altered mental status EXAM: PORTABLE CHEST 1 VIEW COMPARISON:  Portable exam 1427 hours compared to 04/20/2019 FINDINGS: Upper normal heart size. Mediastinal contours and pulmonary vascularity normal. Minimal RIGHT basilar atelectasis. Lungs otherwise clear. No infiltrate, pleural effusion, or pneumothorax. Bones demineralized. IMPRESSION: Minimal RIGHT basilar atelectasis. Electronically Signed   By: Lavonia Dana M.D.   On: 11/11/2021 14:43     PHYSICAL EXAM  Temp:  [97.8 F (36.6 C)-98.6 F (37 C)] 98.1 F (36.7 C) (11/12 0345) Pulse Rate:  [60-69] 67 (11/12 0944) Resp:  [16-20] 16 (11/12 0944) BP: (151-188)/(85-127) 188/127 (11/12 0944) SpO2:  [95 %-99 %] 98 % (11/12 1102)  General - Well nourished, well developed, in no apparent distress.  Ophthalmologic - fundi not visualized due to noncooperation.  Cardiovascular - Regular rhythm and rate.  Mental Status -  Level of arousal and orientation to time, place, and person were intact. Mild psychomotor slowing Language including expression, naming, repetition, comprehension was assessed and found intact  but paucity of speech.  Cranial Nerves II - XII - II - Visual field intact OU. III, IV, VI - Extraocular movements intact. V - Facial sensation decreased on the left. VII - Facial movement showed weakness on the left. VIII - Hearing & vestibular intact bilaterally. X - Palate elevates symmetrically. XI - Chin turning & shoulder shrug intact bilaterally. XII - Tongue protrusion intact.  Motor Strength - The patient's strength was normal in RUE and RLE, but left UE spastic plegia, LLE able to against gravity but drift with   Reflexes - The patient's reflexes were 4+ on the LUE and LLE with sustained clonus   Sensory - Light touch, temperature/pinprick were assessed and were total loss at LUE and LLE  Coordination - The patient had normal movements in the R hand with no ataxia or dysmetria.  Tremor was absent, but sustained clonus LUE and LLE.  Gait and Station - deferred.   ASSESSMENT/PLAN Mr. Hoy Fallert is a 66 y.o. male with history of hypertension, hyperlipidemia, right BG ICH in 2011 with spastic hemiparesis, one-time seizure on Keppra admitted for fever, URI and confusion.  Found to have COVID-positive.  No tPA given due to outside window.    Stroke:  left frontal semiovale infarct secondary to small vessel disease in the setting of COVID CT head questionable left occipital horn hypodensity MRI left frontal white matter small infarct CTA head and neck unremarkable 2D Echo EF 60 to 65% LDL 103 in 09/2021 HgbA1c 5.7 in 09/2021 Lovenox for VTE prophylaxis No antithrombotic prior to admission, now on aspirin 81 mg daily.  No DAPT given history ICH and now with mild thrombocytopenia Patient counseled to be compliant with his antithrombotic medications Ongoing aggressive stroke risk factor management Therapy recommendations:  Pending Disposition: Pending  COVID infection Fever 102.5 on admission, now afebrile URI symptoms On Decadron and molnupiravir Management per primary  team  History of ICH and seizure Right BG ICH in 03/2009 With residual left spastic hemiplegia, follow with [facility] One-time seizure in 04/2019, MRI negative for acute finding, put on Keppra Follow-up with [facility], last EEG 05/2021 negative.   Wife would like to continue keppra without taper. Continue follow-up with [facility] Jessica McCue  Hypertension Stable Long term BP goal normotensive  Hyperlipidemia Home meds: Lipitor 10 LDL 103, goal < 70 Now on Lipitor 20 No high intensity statin given LDL not far from goal Continue statin at discharge  Other Stroke Risk Factors   Other Active Problems Elevated LFT, AST 79-> 59->47 AKI creatinine 1.47-1.29-0.87-0.81 Thrombocytopenia platelet 114->103->100 Neutropenia WBC 3.6->2.8->5.4 Hypokalemia 2.9->2.6-> supplement  Hospital day # 3  Neurology will sign off. Please call with questions. Pt will follow up with stroke clinic Jessica NP at [facility] on 01/27/22. Thanks for the consult.   Rosalin Hawking, MD PhD Stroke Neurology 11/14/2021 12:40 PM    To contact Stroke Continuity provider, please refer to [email or web address] After hours, contact General Neurology

## 2021-11-14 NOTE — Progress Notes (Addendum)
PROGRESS NOTE    Steve Burton  TKP:[id number] DOB: [birth year] DOA: 11/11/2021 PCP: Carlena Hurl, PA-C     Brief Narrative:   hypertension, hyperlipidemia, stroke with residual left spastic hemiparesis (unable to move his left upper extremity since then but is able to walk using a walker), seizure disorder presented to the ED via EMS for evaluation of fevers, URI symptoms, confusion, and urinary incontinence.  He had a virtual visit with PCP earlier today.  His wife recently tested positive for COVID after traveling to [place].  In the ED, patient noted to be febrile with temperature as high as 102.5 F and oxygen saturation as low as 89% on room air.  He was placed on 2 L O2.  Patient is confused and slow to respond to questions.  States his wife sent him to the hospital as he was having difficulty controlling his bladder.   Subjective:  He is oriented x3 ,  confusion appears has resolved, I did not hear him coughing today He reports is feeling better, he wants to go home, but he understand his potassium level need to be better and blood pressure need to be better  Assessment & Plan:  Principal Problem:   COVID-19 virus infection Active Problems:   Essential hypertension   Left spastic hemiparesis (HCC)   Spasticity due to old stroke   Hyperlipidemia   Acute hypoxemic respiratory failure (HCC)   Acute metabolic encephalopathy   Urinary incontinence   Normocytic anemia   Elevated transaminase level   Thrombocytopenia (HCC)   Hypokalemia   AKI (acute kidney injury) ([place])   Seizure disorder ([place])    COVID-19 viral infection/ Acute hypoxemic respiratory failure -improving -Continue molnupiravir,   Decadron, crp coming down   Acute metabolic encephalopathy, likely from hypoxia, he does has acute CVA but small Urine culture  no significant growth Confusion has resolved  Acute CVA: Small acute infarct in the left frontal white matter on MRI Initially Allowed permissive  hypertension for up to 48hrs , then gradually lower bp to normal tensive , continue adjust bp meds Continue asa, lipitor  Neurology consulted , input appreciated   History of ICH with residual left spastic hemiparesis,  MRI Redemonstrated large area of encephalomalacia in the right cerebral hemisphere, the site of a remote intraparenchymal hematoma.not on antiplatelet at home Seizure disorder, continue home meds keppra    Urinary incontinence -urine culture insignificant growth -bladder scan 157cc -possible component of neurogenic bladder -Start Flomax  Mild elevation of transaminase Likely due to acute acute covid viral infection. -Improving   Mild thrombocytopenia/lymphopenia Likely due to acute viral infection.   -Continue to monitor   Hypokalemia/hypophosphatemia/hypomagnesemia Mag and phos improved, but persistent severe hypokalemia despite aggressive supplement , continue to replace, will give iv mag as well, repeat lab in the morning. Gradually restart losartan and spironolactone, this will help potassium level Wife states potassium tablet is too large, change to potassium packet   AKI Resolved after hydration  -Avoid nephrotoxic agents -home meds  losartan and spironolactone held initially ,gradually restart, monitor cr   Hypertension: Initially allow permissive hypertension due to acute CVA, gradually restart home BP meds Hyperlipidemia; continue statin  FTT, wheelchair bound since 2011, PT/OT eval, wife if not sure she can take care of him at home  I have Reviewed nursing notes, Vitals, pain scores, I/o's, Lab results and  imaging results since pt's last encounter, details please see discussion above  I ordered the following labs:  Unresulted Labs (From admission,  onward)     Start     Ordered   11/15/21 0500  CBC  Tomorrow morning,   R       Question:  Specimen collection method  Answer:  Lab=Lab collect   11/14/21 1315   11/14/21 0500  C-reactive protein   Daily at 5am,   R      11/13/21 0728   11/14/21 3546  Basic metabolic panel  Daily at 5am,   R      11/13/21 0728   11/14/21 0500  Phosphorus  Daily at 5am,   R      11/13/21 0728             DVT prophylaxis: enoxaparin (LOVENOX) injection 40 mg Start: 11/11/21 2200   Code Status:   Code Status: Full Code  Family Communication: wife cell phone listed on chart is not correct, home phone number is correct wife updated over the phone on 11/11 and 11/12  Disposition:    Dispo: The patient is from: home              Anticipated d/c is to: need PT /OT eval, wife is concerned that she is not able to take care of him at home              Anticipated d/c date is: monitor bp, potassium, possible   d/c on Monday or Tuesday   Antimicrobials:   Anti-infectives (From admission, onward)    Start     Dose/Rate Route Frequency Ordered Stop   11/11/21 2359  molnupiravir EUA (LAGEVRIO) capsule 800 mg        4 capsule Oral 2 times daily 11/11/21 2020 11/16/21 2159           Objective: Vitals:   11/14/21 0944 11/14/21 1102 11/14/21 1200 11/14/21 1600  BP: (!) 188/127  (!) 141/110 (!) 175/94  Pulse: 67  84 64  Resp: 16  20 (!) 21  Temp:      TempSrc:      SpO2: 99% 98% 98% 97%  Weight:      Height:        Intake/Output Summary (Last 24 hours) at 11/14/2021 1838 Last data filed at 11/14/2021 0944 Gross per 24 hour  Intake 1113.06 ml  Output 2850 ml  Net -1736.94 ml   Filed Weights   11/11/21 1417  Weight: 72.6 kg    Examination:  General exam: aaox3, confusion appear has resolved, hard of hearing  Respiratory system: Clear to auscultation. Respiratory effort normal. Cardiovascular system:  RRR.  Gastrointestinal system: Abdomen is nondistended, soft and nontender.  Normal bowel sounds heard. Central nervous system: AAOx3, left-sided weakness from prior CVA. Extremities:  no edema Skin: No rashes, lesions or ulcers Psychiatry: Pleasant, no agitation.     Data  Reviewed: I have personally reviewed  labs and visualized  imaging studies since the last encounter and formulate the plan        Scheduled Meds:  aspirin EC  81 mg Oral Daily   atorvastatin  20 mg Oral Daily   dexamethasone (DECADRON) injection  6 mg Intravenous Q24H   enoxaparin (LOVENOX) injection  40 mg Subcutaneous Q24H   labetalol  200 mg Oral BID   levETIRAcetam  500 mg Oral BID   [START ON 11/15/2021] losartan  100 mg Oral Daily   losartan  50 mg Oral Once   molnupiravir EUA  4 capsule Oral BID   potassium chloride  40 mEq Oral BID  tamsulosin  0.4 mg Oral Daily   Continuous Infusions:     LOS: 3 days     Florencia Reasons, MD PhD FACP [facility]  Available via Epic secure chat 7am-7pm for nonurgent issues Please page for urgent issues To page the attending provider between 7A-7P or the covering provider during after hours 7P-7A, please log into the web site [email or web address] and access using universal [place] password for that web site. If you do not have the password, please call the hospital operator.    11/14/2021, 6:38 PM

## 2021-11-14 NOTE — Progress Notes (Signed)
Dr. Josephine Cables, on-call for attending, text-paged via Amion regarding pt's potassium of 2.6. Page returned and replacement orders received and implemented.

## 2021-11-15 DIAGNOSIS — U071 COVID-19: Secondary | ICD-10-CM | POA: Diagnosis not present

## 2021-11-15 LAB — BASIC METABOLIC PANEL
Anion gap: 12 (ref 5–15)
BUN: 19 mg/dL (ref 8–23)
CO2: 24 mmol/L (ref 22–32)
Calcium: 8.3 mg/dL — ABNORMAL LOW (ref 8.9–10.3)
Chloride: 103 mmol/L (ref 98–111)
Creatinine, Ser: 0.84 mg/dL (ref 0.61–1.24)
GFR, Estimated: >60 mL/min (ref >60)
Glucose, Bld: 128 mg/dL — ABNORMAL HIGH (ref 70–99)
Potassium: 2.8 mmol/L — ABNORMAL LOW (ref 3.5–5.1)
Sodium: 139 mmol/L (ref 135–145)

## 2021-11-15 LAB — CBC
HCT: 35.9 % — ABNORMAL LOW (ref 39.0–52.0)
Hemoglobin: 12.4 g/dL — ABNORMAL LOW (ref 13.0–17.0)
MCH: 29.4 pg (ref 26.0–34.0)
MCHC: 34.5 g/dL (ref 30.0–36.0)
MCV: 85.1 fL (ref 80.0–100.0)
Platelets: 94 10*3/uL — ABNORMAL LOW (ref 150–400)
RBC: 4.22 MIL/uL (ref 4.22–5.81)
RDW: 13 % (ref 11.5–15.5)
WBC: 5.3 10*3/uL (ref 4.0–10.5)
nRBC: 0 % (ref 0.0–0.2)

## 2021-11-15 LAB — PHOSPHORUS: Phosphorus: 2.3 mg/dL — ABNORMAL LOW (ref 2.5–4.6)

## 2021-11-15 LAB — C-REACTIVE PROTEIN: CRP: 0.6 mg/dL (ref <1.0)

## 2021-11-15 LAB — BRAIN NATRIURETIC PEPTIDE: B Natriuretic Peptide: 119.4 pg/mL — ABNORMAL HIGH (ref 0.0–100.0)

## 2021-11-15 MED ORDER — DILTIAZEM HCL ER COATED BEADS 240 MG PO CP24
240.0000 mg | ORAL_CAPSULE | Freq: Every day | ORAL | Status: DC
  Administered 2021-11-15 – 2021-11-17 (×3): 240 mg via ORAL
  Filled 2021-11-15 (×3): qty 1

## 2021-11-15 MED ORDER — SODIUM PHOSPHATES 45 MMOLE/15ML IV SOLN
30.0000 mmol | Freq: Once | INTRAVENOUS | Status: DC
  Filled 2021-11-15: qty 10

## 2021-11-15 MED ORDER — POTASSIUM CHLORIDE 20 MEQ PO PACK: 40.0000 meq | PACK | Freq: Two times a day (BID) | ORAL | Status: DC

## 2021-11-15 MED ORDER — MAGNESIUM SULFATE 2 GM/50ML IV SOLN
2.0000 g | Freq: Once | INTRAVENOUS | Status: AC
  Administered 2021-11-15: 2 g via INTRAVENOUS
  Filled 2021-11-15: qty 50

## 2021-11-15 MED ORDER — POTASSIUM CHLORIDE CRYS ER 20 MEQ PO TBCR
40.0000 meq | EXTENDED_RELEASE_TABLET | Freq: Two times a day (BID) | ORAL | Status: AC
  Administered 2021-11-15 (×2): 40 meq via ORAL
  Filled 2021-11-15 (×2): qty 2

## 2021-11-15 MED ORDER — POTASSIUM CHLORIDE 2 MEQ/ML IV SOLN
INTRAVENOUS | Status: AC
  Filled 2021-11-15: qty 1000

## 2021-11-15 MED ORDER — DEXAMETHASONE SODIUM PHOSPHATE 4 MG/ML IJ SOLN
2.0000 mg | INTRAMUSCULAR | Status: AC
  Administered 2021-11-15: 2 mg via INTRAVENOUS
  Filled 2021-11-15: qty 1

## 2021-11-15 NOTE — Progress Notes (Signed)
Pt states that he is ready to go home. Pt educated on treatment needs placed by physician and there are no DC orders for pt. Candiss Norse, MD and Junie Panning, charge RN notified.

## 2021-11-15 NOTE — Progress Notes (Signed)
PROGRESS NOTE    Steve Burton  [id number] DOB: 1955-04-05 DOA: 11/11/2021 PCP: Carlena Hurl, PA-C     Brief Narrative:   hypertension, hyperlipidemia, stroke with residual left spastic hemiparesis (unable to move his left upper extremity since then but is able to walk using a walker), seizure disorder presented to the ED via EMS for evaluation of fevers, URI symptoms, confusion, and urinary incontinence.  He had a virtual visit with PCP earlier today.  His wife recently tested positive for COVID after traveling to [place].  In the ED, patient noted to be febrile with temperature as high as 102.5 F and oxygen saturation as low as 89% on room air.  He was placed on 2 L O2.  Patient is confused and slow to respond to questions.  States his wife sent him to the hospital as he was having difficulty controlling his bladder.   Subjective: Patient in bed, appears comfortable, denies any headache, no fever, no chest pain or pressure, no shortness of breath , no abdominal pain. No new focal weakness, wants to go home   Assessment & Plan:    COVID-19 viral infection/ Acute hypoxemic respiratory failure - improving, finish the course of  molnupiravir,   Decadron, crp stable, on RA.   Acute metabolic encephalopathy, likely from hypoxia, he does has acute CVA but small, Urine culture  no significant growth, Confusion has resolved  Acute CVA: Small acute infarct in the left frontal white matter on MRI, seen by stroke team, due to previous history of intracranial hemorrhage current medication regimen is aspirin and statin for secondary prevention.  Note patient has chronic left-sided hemiparesis and not compliant with PT OT or speech eval.  Refuses all.    History of ICH with residual left spastic hemiparesis,  MRI Redemonstrated large area of encephalomalacia in the right cerebral hemisphere, the site of a remote intraparenchymal hematoma, not on antiplatelet at home Seizure disorder, continue  home meds keppra    Urinary incontinence -urine culture insignificant growth -bladder scan 157cc -possible component of neurogenic bladder -Started Flomax  Mild elevation of transaminase Likely due to acute acute covid viral infection. -Improving   Mild thrombocytopenia/lymphopenia Likely due to acute viral infection.   -Continue to monitor   Hypokalemia/hypophosphatemia/hypomagnesemia All replaced   AKI Resolved after hydration  -Avoid nephrotoxic agents -home meds  losartan and spironolactone held initially ,gradually restart, monitor cr   Hypertension: Initially allow permissive hypertension due to acute CVA, on 2 blood pressure medications, home Cardizem also started with as needed hydralazine being on board.  Currently close to his home regimen.  Hyperlipidemia; continue statin  FTT, wheelchair bound since 2011, PT/OT eval, wife if not sure she can take care of him at home, refusing PT OT and speech eval today.     DVT prophylaxis: enoxaparin (LOVENOX) injection 40 mg Start: 11/11/21 2200   Code Status:   Code Status: Full Code  Family Communication: wife cell phone listed on chart is not correct, home phone number is correct wife updated over the phone on 11/11 and 11/12  Disposition: Home  Objective: Vitals:   11/14/21 2200 11/14/21 2350 11/15/21 0600 11/15/21 0800  BP: (!) 161/77 (!) 181/87 (!) 145/109 (!) 199/102  Pulse: 75 67 69 74  Resp: '20 20 17 16  '$ Temp:   98 F (36.7 C) 98.2 F (36.8 C)  TempSrc:   Oral Axillary  SpO2: 98% 98% 99% 96%  Weight:      Height:  Intake/Output Summary (Last 24 hours) at 11/15/2021 0828 Last data filed at 11/15/2021 0600 Gross per 24 hour  Intake 960 ml  Output 2850 ml  Net -1890 ml   Filed Weights   11/11/21 1417  Weight: 72.6 kg    Examination:  Awake Alert, No new F.N deficits, old L. Hemiparesis,  [place].AT,PERRAL Supple Neck, No JVD,   Symmetrical Chest wall movement, Good air movement  bilaterally, CTAB RRR,No Gallops, Rubs or new Murmurs,  +ve B.Sounds, Abd Soft, No tenderness,   No Cyanosis, Clubbing or edema   Data Reviewed:   Recent Labs  Lab 11/11/21 1430 11/12/21 0611 11/13/21 0157 11/14/21 0306 11/15/21 0227  WBC 4.1 3.6* 2.8* 5.4 5.3  HGB 11.6* 12.6* 12.4* 12.6* 12.4*  HCT 34.0* 36.4* 34.8* 36.3* 35.9*  PLT 134* 114* 103* 100* 94*  MCV 85.9 85.2 83.1 83.3 85.1  MCH 29.3 29.5 29.6 28.9 29.4  MCHC 34.1 34.6 35.6 34.7 34.5  RDW 13.2 13.1 12.6 12.7 13.0  LYMPHSABS 0.7  --  0.6* 0.8  --   MONOABS 0.7  --  0.3 0.3  --   EOSABS 0.0  --  0.0 0.0  --   BASOSABS 0.0  --  0.0 0.0  --     Recent Labs  Lab 11/11/21 1430 11/11/21 1550 11/11/21 2330 11/12/21 0611 11/13/21 0157 11/13/21 1453 11/14/21 0306 11/15/21 0227  NA 140  --   --  144 142 143 142 139  K 2.7*  --    < > 3.3* 2.5* 2.9* 2.6* 2.8*  CL 99  --   --  103 106 103 105 103  CO2 29  --   --  '27 26 26 28 24  '$ GLUCOSE 109*  --   --  116* 146* 126* 133* 128*  BUN 20  --   --  '16 18 14 16 19  '$ CREATININE 1.47*  --   --  1.29* 0.98 0.87 0.81 0.84  CALCIUM 8.7*  --   --  8.5* 8.5* 8.3* 8.4* 8.3*  AST 78*  --   --  79* 59*  --  47*  --   ALT 32  --   --  40 33  --  35  --   ALKPHOS 66  --   --  67 60  --  71  --   BILITOT 1.1  --   --  1.1 0.4  --  0.4  --   ALBUMIN 3.5  --   --  3.4* 3.2*  --  3.2*  --   MG  --  2.1  --   --  1.7  --  1.8  --   PHOS  --   --   --   --  2.3*  --  3.2 2.3*  CRP  --   --   --  4.5*  --   --  1.0* 0.6  DDIMER  --   --   --  0.83*  --   --   --   --   PROCALCITON  --   --   --  <0.10  --   --   --   --   BNP  --   --   --   --   --   --   --  119.4*   < > = values in this interval not displayed.    Scheduled Meds:  aspirin EC  81 mg Oral Daily   atorvastatin  20 mg Oral Daily   dexamethasone (DECADRON) injection  6 mg Intravenous Q24H   enoxaparin (LOVENOX) injection  40 mg Subcutaneous Q24H   labetalol  200 mg Oral BID   levETIRAcetam  500 mg Oral BID    losartan  100 mg Oral Daily   molnupiravir EUA  4 capsule Oral BID   potassium chloride  40 mEq Oral BID   tamsulosin  0.4 mg Oral Daily   Continuous Infusions:  lactated ringers 1,000 mL with potassium chloride 40 mEq infusion     magnesium sulfate bolus IVPB     sodium phosphate 30 mmol in dextrose 5 % 250 mL infusion        LOS: 4 days   Signature   Lala Lund M.D on 11/15/2021 at 8:28 AM   -  To page go to [email or web address]

## 2021-11-16 ENCOUNTER — Telehealth: Payer: Self-pay | Admitting: *Deleted

## 2021-11-16 LAB — CBC WITH DIFFERENTIAL/PLATELET
Abs Immature Granulocytes: 0.08 10*3/uL — ABNORMAL HIGH (ref 0.00–0.07)
Basophils Absolute: 0 10*3/uL (ref 0.0–0.1)
Basophils Relative: 0 %
Eosinophils Absolute: 0 10*3/uL (ref 0.0–0.5)
Eosinophils Relative: 0 %
HCT: 32.7 % — ABNORMAL LOW (ref 39.0–52.0)
Hemoglobin: 11.6 g/dL — ABNORMAL LOW (ref 13.0–17.0)
Immature Granulocytes: 1 %
Lymphocytes Relative: 25 %
Lymphs Abs: 1.5 10*3/uL (ref 0.7–4.0)
MCH: 29.7 pg (ref 26.0–34.0)
MCHC: 35.5 g/dL (ref 30.0–36.0)
MCV: 83.6 fL (ref 80.0–100.0)
Monocytes Absolute: 0.6 10*3/uL (ref 0.1–1.0)
Monocytes Relative: 11 %
Neutro Abs: 3.6 10*3/uL (ref 1.7–7.7)
Neutrophils Relative %: 63 %
Platelets: 83 10*3/uL — ABNORMAL LOW (ref 150–400)
RBC: 3.91 MIL/uL — ABNORMAL LOW (ref 4.22–5.81)
RDW: 12.9 % (ref 11.5–15.5)
WBC: 5.8 10*3/uL (ref 4.0–10.5)
nRBC: 0 % (ref 0.0–0.2)

## 2021-11-16 LAB — COMPREHENSIVE METABOLIC PANEL
ALT: 48 U/L — ABNORMAL HIGH (ref 0–44)
AST: 38 U/L (ref 15–41)
Albumin: 3 g/dL — ABNORMAL LOW (ref 3.5–5.0)
Alkaline Phosphatase: 53 U/L (ref 38–126)
Anion gap: 8 (ref 5–15)
BUN: 24 mg/dL — ABNORMAL HIGH (ref 8–23)
CO2: 26 mmol/L (ref 22–32)
Calcium: 8.7 mg/dL — ABNORMAL LOW (ref 8.9–10.3)
Chloride: 110 mmol/L (ref 98–111)
Creatinine, Ser: 1.09 mg/dL (ref 0.61–1.24)
GFR, Estimated: >60 mL/min (ref >60)
Glucose, Bld: 110 mg/dL — ABNORMAL HIGH (ref 70–99)
Potassium: 2.7 mmol/L — CL (ref 3.5–5.1)
Sodium: 144 mmol/L (ref 135–145)
Total Bilirubin: 0.5 mg/dL (ref 0.3–1.2)
Total Protein: 5.4 g/dL — ABNORMAL LOW (ref 6.5–8.1)

## 2021-11-16 LAB — POTASSIUM: Potassium: 2.8 mmol/L — ABNORMAL LOW (ref 3.5–5.1)

## 2021-11-16 LAB — MAGNESIUM: Magnesium: 2.2 mg/dL (ref 1.7–2.4)

## 2021-11-16 LAB — C-REACTIVE PROTEIN: CRP: 0.5 mg/dL (ref <1.0)

## 2021-11-16 LAB — BRAIN NATRIURETIC PEPTIDE: B Natriuretic Peptide: 110.2 pg/mL — ABNORMAL HIGH (ref 0.0–100.0)

## 2021-11-16 LAB — PHOSPHORUS: Phosphorus: 2.9 mg/dL (ref 2.5–4.6)

## 2021-11-16 MED ORDER — HYDRALAZINE HCL 50 MG PO TABS
50.0000 mg | ORAL_TABLET | Freq: Three times a day (TID) | ORAL | Status: DC
  Administered 2021-11-16 – 2021-11-17 (×3): 50 mg via ORAL
  Filled 2021-11-16 (×3): qty 1

## 2021-11-16 MED ORDER — POTASSIUM CHLORIDE CRYS ER 20 MEQ PO TBCR: 40.0000 meq | EXTENDED_RELEASE_TABLET | Freq: Once | ORAL | Status: DC

## 2021-11-16 MED ORDER — LACTATED RINGERS IV SOLN: INTRAVENOUS | Status: AC

## 2021-11-16 MED ORDER — SPIRONOLACTONE 25 MG PO TABS
25.0000 mg | ORAL_TABLET | Freq: Every day | ORAL | Status: DC
  Administered 2021-11-16 – 2021-11-17 (×2): 25 mg via ORAL
  Filled 2021-11-16 (×2): qty 1

## 2021-11-16 MED ORDER — POTASSIUM CHLORIDE 10 MEQ/100ML IV SOLN
10.0000 meq | INTRAVENOUS | Status: AC
  Administered 2021-11-16 – 2021-11-17 (×6): 10 meq via INTRAVENOUS
  Filled 2021-11-16 (×6): qty 100

## 2021-11-16 MED ORDER — POTASSIUM CHLORIDE CRYS ER 20 MEQ PO TBCR
40.0000 meq | EXTENDED_RELEASE_TABLET | Freq: Once | ORAL | Status: AC
  Administered 2021-11-16: 40 meq via ORAL
  Filled 2021-11-16: qty 2

## 2021-11-16 MED ORDER — POTASSIUM CHLORIDE CRYS ER 20 MEQ PO TBCR
40.0000 meq | EXTENDED_RELEASE_TABLET | Freq: Four times a day (QID) | ORAL | Status: AC
  Administered 2021-11-16 – 2021-11-17 (×2): 40 meq via ORAL
  Filled 2021-11-16 (×2): qty 2

## 2021-11-16 MED ORDER — POTASSIUM CHLORIDE 10 MEQ/100ML IV SOLN
10.0000 meq | INTRAVENOUS | Status: AC
  Administered 2021-11-16 (×5): 10 meq via INTRAVENOUS
  Filled 2021-11-16 (×4): qty 100

## 2021-11-16 NOTE — TOC Initial Note (Signed)
Transition of Care [facility]) - Initial/Assessment Note    Patient Details  Name: Steve Burton MRN: [id number] Date of Birth: 03/13/1955  Transition of Care [facility]) CM/SW Contact:    Carles Collet, RN Phone Number: 11/16/2021, 2:21 PM  Clinical Narrative:                  Attempt to reach wife by phone x2, LVM, and attempt to call room, no answer. If wife returns call, will assess and set up [facility] services.   Expected Discharge Plan: [place] Barriers to Discharge: Continued Medical Work up   Patient Goals and CMS Choice        Expected Discharge Plan and Services Expected Discharge Plan: [place]                                              Prior Living Arrangements/Services                       Activities of Daily Living Home Assistive Devices/Equipment: Built-in shower seat, Eyeglasses ADL Screening (condition at time of admission) Patient's cognitive ability adequate to safely complete daily activities?: Yes Is the patient deaf or have difficulty hearing?: Yes Does the patient have difficulty seeing, even when wearing glasses/contacts?: No Does the patient have difficulty concentrating, remembering, or making decisions?: No Patient able to express need for assistance with ADLs?: Yes Does the patient have difficulty dressing or bathing?: Yes Independently performs ADLs?: No Communication: Needs assistance Is this a change from baseline?: Pre-admission baseline Dressing (OT): Needs assistance Is this a change from baseline?: Pre-admission baseline Grooming: Needs assistance Is this a change from baseline?: Pre-admission baseline Feeding: Needs assistance Is this a change from baseline?: Pre-admission baseline Bathing: Needs assistance Is this a change from baseline?: Pre-admission baseline Toileting: Needs assistance Is this a change from baseline?: Pre-admission baseline In/Out Bed: Needs assistance Is this a  change from baseline?: Pre-admission baseline Walks in Home: Needs assistance Is this a change from baseline?: Pre-admission baseline Does the patient have difficulty walking or climbing stairs?: Yes Weakness of Legs: None Weakness of Arms/Hands: Left (PRIOR STROKE)  Permission Sought/Granted                  Emotional Assessment              Admission diagnosis:  Dehydration [E86.0] Hypoxia [R09.02] COVID [U07.1] COVID-19 virus infection [U07.1] Patient Active Problem List   Diagnosis Date Noted   COVID-19 virus infection 11/11/2021   Acute hypoxemic respiratory failure ([place]) 59/56/3875   Acute metabolic encephalopathy 64/33/2951   Urinary incontinence 11/11/2021   Normocytic anemia 11/11/2021   Elevated transaminase level 11/11/2021   Thrombocytopenia ([place]) 11/11/2021   Hypokalemia 11/11/2021   AKI (acute kidney injury) ([place]) 11/11/2021   Seizure disorder ([place]) 11/11/2021   Impaired fasting blood sugar 09/27/2021   Encounter for health maintenance examination in adult 09/27/2021   Medicare annual wellness visit, subsequent 09/27/2021   Wheelchair dependence 03/25/2021   Iron deficiency anemia 01/06/2021   Nail lesion 01/06/2021   Hyperlipidemia 07/21/2020   Decreased hearing, bilateral 03/17/2020   Impaired fasting glucose 11/22/2016   Elevated PSA measurement 11/22/2016   History of hemorrhagic cerebrovascular accident (CVA) with residual deficit 11/21/2016   Immunization refused 11/21/2016   Spasticity due to old stroke 11/18/2016  Hypertrophic obstructive cardiomyopathy ([place]) 12/03/2014   Left spastic hemiparesis ([place]) 03/07/2011   Essential hypertension 08/02/2008   PCP:  Carlena Hurl, PA-C Pharmacy:   [id number] - [address] [place] [address] [place] [address] Phone: [phone] Fax: [phone]  [facility] ([place], [place] [facility] [place] [address] [place] [address] Phone: [phone] Fax: [phone]  [place], [place] [address] Phone: [phone] Fax: [phone]  [facility] [address] [place] [address] Phone: [phone] Fax: [phone]     Social Determinants of Health (SDOH) Interventions    Readmission Risk Interventions     No data to display

## 2021-11-16 NOTE — Telephone Encounter (Signed)
Patient's wife, [name] called asking to speak to you. Wanted to update you about husband's hospital admission. She has lots of questions. She will be at the hospital from 10am-6pm today. Not sure if you want to call her, or if you would prefer we schedule a telephone/virtual visit sometime tomorrow. I told her we would ask you. She also wanted me to let you know she tested negative for covid.

## 2021-11-16 NOTE — Progress Notes (Signed)
PROGRESS NOTE    Steve Burton  MHD:[id number] DOB: 1955-01-18 DOA: 11/11/2021 PCP: Carlena Hurl, PA-C     Brief Narrative:   hypertension, hyperlipidemia, stroke with residual left spastic hemiparesis (unable to move his left upper extremity since then but is able to walk using a walker), seizure disorder presented to the ED via EMS for evaluation of fevers, URI symptoms, confusion, and urinary incontinence.  He had a virtual visit with PCP earlier today.  His wife recently tested positive for COVID after traveling to [place].  In the ED, patient noted to be febrile with temperature as high as 102.5 F and oxygen saturation as low as 89% on room air.  He was placed on 2 L O2.  Patient is confused and slow to respond to questions.  States his wife sent him to the hospital as he was having difficulty controlling his bladder.   Subjective: Patient in bed, appears comfortable, denies any headache, no fever, no chest pain or pressure, no shortness of breath , no abdominal pain. No focal weakness.   Assessment & Plan:    COVID-19 viral infection/ Acute hypoxemic respiratory failure - improving, finish the course of  molnupiravir,   Decadron, crp stable, on RA.   Acute metabolic encephalopathy, likely from hypoxia, he does has acute CVA but small, Urine culture  no significant growth, Confusion has resolved  Acute CVA: Small acute infarct in the left frontal white matter on MRI, seen by stroke team, due to previous history of intracranial hemorrhage current medication regimen is aspirin and statin for secondary prevention.  Note patient has chronic left-sided hemiparesis and not compliant with PT OT or speech eval.  Refuses all while in the hospital.  Home PT OT ordered upon wife's request.    History of ICH with residual left spastic hemiparesis,  MRI Redemonstrated large area of encephalomalacia in the right cerebral hemisphere, the site of a remote intraparenchymal hematoma, not on  antiplatelet at home.  Seizure disorder, continue home meds keppra.    Urinary incontinence -Stable on Flomax.  Mild elevation of transaminase Likely due to acute acute covid viral infection. Improving.   Mild thrombocytopenia/lymphopenia Likely due to acute viral infection. Continue to monitor.   Hypokalemia severe and persistent.  Aggressively replaced IV and p.o., potassium recheck this evening, magnesium is stable, added Aldactone.   AKI Resolved after hydration.    Hypertension: Multiple days out of acute CVA, blood pressure medications readjusted on 11/16/2021 for better control.  Hyperlipidemia; continue statin.  FTT, wheelchair bound since 2011, PT/OT eval, wife if not sure she can take care of him at home, refusing PT OT and speech eval today.     DVT prophylaxis: enoxaparin (LOVENOX) injection 40 mg Start: 11/11/21 2200   Code Status:   Code Status: Full Code  Family Communication: wife updated over the phone on 11/16/2021 in detail  Disposition: Home  Objective: Vitals:   11/15/21 2000 11/16/21 0105 11/16/21 0454 11/16/21 0853  BP: 138/77 (!) 142/81  (!) 154/82  Pulse: 64 (!) 54  (!) 53  Resp: '15 19 16 14  '$ Temp: 98.5 F (36.9 C)  97.8 F (36.6 C)   TempSrc: Oral  Oral   SpO2: 96% 96%    Weight:      Height:        Intake/Output Summary (Last 24 hours) at 11/16/2021 1052 Last data filed at 11/15/2021 1700 Gross per 24 hour  Intake 1000 ml  Output --  Net 1000 ml  Filed Weights   11/11/21 1417  Weight: 72.6 kg    Examination:  Awake Alert, No new F.N deficits, old L. Hemiparesis,  .AT,PERRAL Supple Neck, No JVD,   Symmetrical Chest wall movement, Good air movement bilaterally, CTAB RRR,No Gallops, Rubs or new Murmurs,  +ve B.Sounds, Abd Soft, No tenderness,   No Cyanosis, Clubbing or edema    Data Reviewed:   Recent Labs  Lab 11/11/21 1430 11/12/21 0611 11/13/21 0157 11/14/21 0306 11/15/21 0227 11/16/21 0335  WBC 4.1 3.6*  2.8* 5.4 5.3 5.8  HGB 11.6* 12.6* 12.4* 12.6* 12.4* 11.6*  HCT 34.0* 36.4* 34.8* 36.3* 35.9* 32.7*  PLT 134* 114* 103* 100* 94* 83*  MCV 85.9 85.2 83.1 83.3 85.1 83.6  MCH 29.3 29.5 29.6 28.9 29.4 29.7  MCHC 34.1 34.6 35.6 34.7 34.5 35.5  RDW 13.2 13.1 12.6 12.7 13.0 12.9  LYMPHSABS 0.7  --  0.6* 0.8  --  1.5  MONOABS 0.7  --  0.3 0.3  --  0.6  EOSABS 0.0  --  0.0 0.0  --  0.0  BASOSABS 0.0  --  0.0 0.0  --  0.0    Recent Labs  Lab 11/11/21 1430 11/11/21 1550 11/11/21 2330 11/12/21 0611 11/13/21 0157 11/13/21 1453 11/14/21 0306 11/15/21 0227 11/16/21 0335  NA 140  --   --  144 142 143 142 139 144  K 2.7*  --    < > 3.3* 2.5* 2.9* 2.6* 2.8* 2.7*  CL 99  --   --  103 106 103 105 103 110  CO2 29  --   --  '27 26 26 28 24 26  '$ GLUCOSE 109*  --   --  116* 146* 126* 133* 128* 110*  BUN 20  --   --  '16 18 14 16 19 '$ 24*  CREATININE 1.47*  --   --  1.29* 0.98 0.87 0.81 0.84 1.09  CALCIUM 8.7*  --   --  8.5* 8.5* 8.3* 8.4* 8.3* 8.7*  AST 78*  --   --  79* 59*  --  47*  --  38  ALT 32  --   --  40 33  --  35  --  48*  ALKPHOS 66  --   --  67 60  --  71  --  53  BILITOT 1.1  --   --  1.1 0.4  --  0.4  --  0.5  ALBUMIN 3.5  --   --  3.4* 3.2*  --  3.2*  --  3.0*  MG  --  2.1  --   --  1.7  --  1.8  --  2.2  PHOS  --   --   --   --  2.3*  --  3.2 2.3* 2.9  CRP  --   --   --  4.5*  --   --  1.0* 0.6 0.5  DDIMER  --   --   --  0.83*  --   --   --   --   --   PROCALCITON  --   --   --  <0.10  --   --   --   --   --   BNP  --   --   --   --   --   --   --  119.4* 110.2*   < > = values in this interval not displayed.    Scheduled Meds:  aspirin EC  81 mg Oral Daily   atorvastatin  20 mg Oral Daily   diltiazem  240 mg Oral Daily   enoxaparin (LOVENOX) injection  40 mg Subcutaneous Q24H   hydrALAZINE  50 mg Oral Q8H   labetalol  200 mg Oral BID   levETIRAcetam  500 mg Oral BID   losartan  100 mg Oral Daily   potassium chloride  40 mEq Oral Once   spironolactone  25 mg Oral Daily    tamsulosin  0.4 mg Oral Daily   Continuous Infusions:  lactated ringers 75 mL/hr at 11/16/21 0642   potassium chloride 10 mEq (11/16/21 1042)   sodium phosphate 30 mmol in dextrose 5 % 250 mL infusion        LOS: 5 days   Signature   Lala Lund M.D on 11/16/2021 at 10:52 AM   -  To page go to [email or web address]

## 2021-11-16 NOTE — Plan of Care (Signed)
  Problem: Education: Goal: Knowledge of General Education information will improve Description: Including pain rating scale, medication(s)/side effects and non-pharmacologic comfort measures Outcome: Progressing   Problem: Pain Managment: Goal: General experience of comfort will improve Outcome: Progressing   

## 2021-11-17 ENCOUNTER — Other Ambulatory Visit (HOSPITAL_COMMUNITY): Payer: Self-pay

## 2021-11-17 DIAGNOSIS — E876 Hypokalemia: Secondary | ICD-10-CM

## 2021-11-17 DIAGNOSIS — J9601 Acute respiratory failure with hypoxia: Secondary | ICD-10-CM

## 2021-11-17 DIAGNOSIS — G9341 Metabolic encephalopathy: Secondary | ICD-10-CM

## 2021-11-17 LAB — CBC WITH DIFFERENTIAL/PLATELET
Abs Immature Granulocytes: 0.12 10*3/uL — ABNORMAL HIGH (ref 0.00–0.07)
Basophils Absolute: 0 10*3/uL (ref 0.0–0.1)
Basophils Relative: 0 %
Eosinophils Absolute: 0.1 10*3/uL (ref 0.0–0.5)
Eosinophils Relative: 1 %
HCT: 36.1 % — ABNORMAL LOW (ref 39.0–52.0)
Hemoglobin: 12.1 g/dL — ABNORMAL LOW (ref 13.0–17.0)
Immature Granulocytes: 2 %
Lymphocytes Relative: 21 %
Lymphs Abs: 1.5 10*3/uL (ref 0.7–4.0)
MCH: 28.6 pg (ref 26.0–34.0)
MCHC: 33.5 g/dL (ref 30.0–36.0)
MCV: 85.3 fL (ref 80.0–100.0)
Monocytes Absolute: 0.6 10*3/uL (ref 0.1–1.0)
Monocytes Relative: 9 %
Neutro Abs: 4.8 10*3/uL (ref 1.7–7.7)
Neutrophils Relative %: 67 %
Platelets: 89 10*3/uL — ABNORMAL LOW (ref 150–400)
RBC: 4.23 MIL/uL (ref 4.22–5.81)
RDW: 12.9 % (ref 11.5–15.5)
WBC: 7.2 10*3/uL (ref 4.0–10.5)
nRBC: 0 % (ref 0.0–0.2)

## 2021-11-17 LAB — COMPREHENSIVE METABOLIC PANEL
ALT: 46 U/L — ABNORMAL HIGH (ref 0–44)
AST: 34 U/L (ref 15–41)
Albumin: 3 g/dL — ABNORMAL LOW (ref 3.5–5.0)
Alkaline Phosphatase: 58 U/L (ref 38–126)
Anion gap: 8 (ref 5–15)
BUN: 13 mg/dL (ref 8–23)
CO2: 22 mmol/L (ref 22–32)
Calcium: 8.3 mg/dL — ABNORMAL LOW (ref 8.9–10.3)
Chloride: 111 mmol/L (ref 98–111)
Creatinine, Ser: 0.86 mg/dL (ref 0.61–1.24)
GFR, Estimated: >60 mL/min (ref >60)
Glucose, Bld: 109 mg/dL — ABNORMAL HIGH (ref 70–99)
Potassium: 3.5 mmol/L (ref 3.5–5.1)
Sodium: 141 mmol/L (ref 135–145)
Total Bilirubin: 0.3 mg/dL (ref 0.3–1.2)
Total Protein: 5.4 g/dL — ABNORMAL LOW (ref 6.5–8.1)

## 2021-11-17 LAB — BRAIN NATRIURETIC PEPTIDE: B Natriuretic Peptide: 216.2 pg/mL — ABNORMAL HIGH (ref 0.0–100.0)

## 2021-11-17 LAB — MAGNESIUM: Magnesium: 1.8 mg/dL (ref 1.7–2.4)

## 2021-11-17 MED ORDER — HYDRALAZINE HCL 50 MG PO TABS
50.0000 mg | ORAL_TABLET | Freq: Three times a day (TID) | ORAL | 1 refills | Status: DC
  Filled 2021-11-17: qty 90, 30d supply, fill #0

## 2021-11-17 MED ORDER — SPIRONOLACTONE 25 MG PO TABS
25.0000 mg | ORAL_TABLET | Freq: Every day | ORAL | 1 refills | Status: DC
  Filled 2021-11-17: qty 30, 30d supply, fill #0

## 2021-11-17 MED ORDER — POTASSIUM CHLORIDE CRYS ER 20 MEQ PO TBCR
EXTENDED_RELEASE_TABLET | ORAL | 0 refills | Status: DC
  Filled 2021-11-17: qty 68, 30d supply, fill #0

## 2021-11-17 MED ORDER — TAMSULOSIN HCL 0.4 MG PO CAPS
0.4000 mg | ORAL_CAPSULE | Freq: Every day | ORAL | 1 refills | Status: DC
  Filled 2021-11-17: qty 30, 30d supply, fill #0

## 2021-11-17 MED ORDER — POTASSIUM CHLORIDE CRYS ER 20 MEQ PO TBCR
40.0000 meq | EXTENDED_RELEASE_TABLET | Freq: Two times a day (BID) | ORAL | Status: DC
  Administered 2021-11-17: 40 meq via ORAL
  Filled 2021-11-17: qty 2

## 2021-11-17 MED ORDER — ASPIRIN 81 MG PO TBEC
81.0000 mg | DELAYED_RELEASE_TABLET | Freq: Every day | ORAL | 0 refills | Status: DC
  Filled 2021-11-17: qty 30, 30d supply, fill #0

## 2021-11-17 NOTE — TOC Transition Note (Signed)
Transition of Care [facility]) - CM/SW Discharge Note   Patient Details  Name: Steve Burton MRN: [id number] Date of Birth: Jan 10, 1955  [facility]) CM/SW Contact:  Cyndi Bender, RN Phone Number: 11/17/2021, 11:12 AM   Clinical Narrative:     Patient stable for discharge.  Spoke to wife, Steve Burton, regarding transition needs. Mercedes offered choice for home health and defers to [facility] to find agency with high ratings.  Cory with [facility] accepted referral.  Patient has a friend that is going to transport home. Patient uses [facility] for transportation to apts. Address, Phone number and PCP verified. No other TOC needs.   Final next level of care: [place] Barriers to Discharge: Barriers Resolved   Patient Goals and CMS Choice Patient states their goals for this hospitalization and ongoing recovery are:: return home CMS Medicare.gov Compare Post Acute Care list provided to:: Patient Represenative (must comment) Choice offered to / list presented to : Spouse  Discharge Placement                 home      Discharge Plan and Services   Discharge Planning Services: CM Consult Post Acute Care Choice: Home Health                    HH Arranged: PT, OT [facility] Agency: [place] Date [place]: 11/17/21 Time [place]: 1111 Representative spoke with at [place]: [place] ([place]) Interventions     Readmission Risk Interventions    11/17/2021   11:11 AM  Readmission Risk Prevention Plan  Transportation Screening Complete  PCP or Specialist Appt within 5-7 Days Complete  Home Care Screening Complete  Medication Review (RN CM) Complete

## 2021-11-17 NOTE — Telephone Encounter (Signed)
Tried to call pt's wife but unable to get in touch with him

## 2021-11-17 NOTE — Plan of Care (Signed)
  Problem: Education: Goal: Knowledge of General Education information will improve Description Including pain rating scale, medication(s)/side effects and non-pharmacologic comfort measures Outcome: Progressing   Problem: Health Behavior/Discharge Planning: Goal: Ability to manage health-related needs will improve Outcome: Progressing   

## 2021-11-17 NOTE — Discharge Summary (Signed)
Physician Discharge Summary  Steve Burton GYJ:[id number] DOB: 1955-08-02 DOA: 11/11/2021  PCP: Carlena Hurl, PA-C  Admit date: 11/11/2021 Discharge date: 11/17/2021  Admitted From: (Home) Disposition:  (Home)  Recommendations for Outpatient Follow-up:  Follow up with PCP in 1weeks Please obtain BMP/CBC in one week  Home Health: (YES)   Discharge Condition: (Stable) CODE STATUS: (FULL) Diet recommendation: Heart Healthy  Brief/Interim Summary:  Hypertension, hyperlipidemia, stroke with residual left spastic hemiparesis (unable to move his left upper extremity since then but is able to walk using a walker), seizure disorder presented to the ED via EMS for evaluation of fevers, URI symptoms, confusion, and urinary incontinence.  He had a virtual visit with PCP earlier today.  His wife recently tested positive for COVID after traveling to [place].  In the ED, patient noted to be febrile with temperature as high as 102.5 F and oxygen saturation as low as 89% on room air.  He was placed on 2 L O2.  Patient is confused and slow to respond to questions.  States his wife sent him to the hospital as he was having difficulty controlling his bladder.  His Work-up was significant for COVID-19 infection, new oxygen requirement which has resolved.   COVID-19 viral infection/ Acute hypoxemic respiratory failure - improving, he was treated with molnupiravir, Decadron, CRP stable, he is currently on room air.      Acute metabolic encephalopathy,  - likely from hypoxia, he does has acute CVA but small, Urine culture  no significant growth, Confusion has resolved   Acute CVA:  - Small acute infarct in the left frontal white matter on MRI, seen by stroke team, due to previous history of intracranial hemorrhage current medication regimen is aspirin and statin for secondary prevention.  Note patient has chronic left-sided hemiparesis and not compliant with PT OT or speech eval.  Refuses all while in  the hospital.  Home PT OT ordered upon wife's request.     History of ICH with residual left spastic hemiparesis,  MRI Redemonstrated large area of encephalomalacia in the right cerebral hemisphere, the site of a remote intraparenchymal hematoma, not on antiplatelet at home.   Seizure disorder, continue home meds keppra.     Urinary incontinence -Stable on Flomax.   Mild elevation of transaminase Likely due to acute acute covid viral infection. Improving.   Mild thrombocytopenia/lymphopenia Likely due to acute viral infection. Continue to monitor.   Hypokalemia severe and persistent.  -  Aggressively replaced IV and p.o., potassium, this morning at 3.5, he is to be resumed on his home dose Aldactone and potassium supplement, but to increase dosing to twice daily potassium for next 3 days.     AKI Resolved after hydration.    Hypertension: Multiple days out of acute CVA, blood pressure medications readjusted on 11/16/2021 for better control.  To resume home meds, hydralazine has been added as well.   Hyperlipidemia; continue statin.   Home health to be arranged on discharge, to call wife, unable to reach her on home or cell phone.      Discharge Diagnoses:  Principal Problem:   COVID-19 virus infection Active Problems:   Essential hypertension   Left spastic hemiparesis (HCC)   Spasticity due to old stroke   Hyperlipidemia   Acute hypoxemic respiratory failure (HCC)   Acute metabolic encephalopathy   Urinary incontinence   Normocytic anemia   Elevated transaminase level   Thrombocytopenia (HCC)   Hypokalemia   AKI (acute kidney injury) ([place])  Seizure disorder [facility])    Discharge Instructions  Discharge Instructions     Diet - low sodium heart healthy   Complete by: As directed    Discharge instructions   Complete by: As directed    Follow with Primary MD Tysinger, Camelia Eng, PA-C in 7 days   Get CBC, CMP, checked  by Primary MD next visit.    Disposition Home     Diet: Heart Healthy .  On your next visit with your primary care physician please Get Medicines reviewed and adjusted.   Please request your Prim.MD to go over all Hospital Tests and Procedure/Radiological results at the follow up, please get all Hospital records sent to your Prim MD by signing hospital release before you go home.   If you experience worsening of your admission symptoms, develop shortness of breath, life threatening emergency, suicidal or homicidal thoughts you must seek medical attention immediately by calling 911 or calling your MD immediately  if symptoms less severe.  You Must read complete instructions/literature along with all the possible adverse reactions/side effects for all the Medicines you take and that have been prescribed to you. Take any new Medicines after you have completely understood and accpet all the possible adverse reactions/side effects.   Do not drive, operating heavy machinery, perform activities at heights, swimming or participation in water activities or provide baby sitting services if your were admitted for syncope or siezures until you have seen by Primary MD or a Neurologist and advised to do so again.  Do not drive when taking Pain medications.    Do not take more than prescribed Pain, Sleep and Anxiety Medications  Special Instructions: If you have smoked or chewed Tobacco  in the last 2 yrs please stop smoking, stop any regular Alcohol  and or any Recreational drug use.  Wear Seat belts while driving.   Please note  You were cared for by a hospitalist during your hospital stay. If you have any questions about your discharge medications or the care you received while you were in the hospital after you are discharged, you can call the unit and asked to speak with the hospitalist on call if the hospitalist that took care of you is not available. Once you are discharged, your primary care physician will handle any further medical issues.  Please note that NO REFILLS for any discharge medications will be authorized once you are discharged, as it is imperative that you return to your primary care physician (or establish a relationship with a primary care physician if you do not have one) for your aftercare needs so that they can reassess your need for medications and monitor your lab values.   Increase activity slowly   Complete by: As directed       Allergies as of 11/17/2021       Reactions   Phenytoin Sodium Extended Swelling, Other (See Comments)   Dilantin- Arm swelling   Penicillins Rash   Has patient had a PCN reaction causing immediate rash, facial/tongue/throat swelling, SOB or lightheadedness with hypotension: Unknown Has patient had a PCN reaction causing severe rash involving mucus membranes or skin necrosis: Unk Has patient had a PCN reaction that required hospitalization: Unk Has patient had a PCN reaction occurring within the last 10 years: Yes If all of the above answers are "NO", then may proceed with Cephalosporin use.        Medication List     STOP taking these medications    molnupiravir EUA  200 mg Caps capsule Commonly known as: LAGEVRIO       TAKE these medications    acetaminophen 500 MG tablet Commonly known as: TYLENOL Take 500 mg by mouth every 6 (six) hours as needed for mild pain.   aspirin EC 81 MG tablet Take 1 tablet (81 mg total) by mouth daily. Swallow whole. Start taking on: November 18, 2021   atorvastatin 10 MG tablet Commonly known as: LIPITOR TAKE 1 TABLET BY MOUTH DAILY   baclofen 10 MG tablet Commonly known as: LIORESAL Take 1 tablet (10 mg total) by mouth 3 (three) times daily.   diltiazem 240 MG 24 hr capsule Commonly known as: CARDIZEM CD TAKE 1 CAPSULE BY MOUTH DAILY   hydrALAZINE 50 MG tablet Commonly known as: APRESOLINE Take 1 tablet (50 mg total) by mouth 3 (three) times daily.   labetalol 100 MG tablet Commonly known as: NORMODYNE TAKE 1 TABLET  BY MOUTH TWICE  DAILY What changed: Another medication with the same name was removed. Continue taking this medication, and follow the directions you see here.   levETIRAcetam 500 MG tablet Commonly known as: KEPPRA TAKE 1 TABLET BY MOUTH TWICE  DAILY   losartan 100 MG tablet Commonly known as: COZAAR TAKE 1 TABLET BY MOUTH DAILY   potassium chloride SA 20 MEQ tablet Commonly known as: KLOR-CON M Take 40 mg oral twice daily x2 days, then back to 40 mg oral daily. What changed:  how much to take how to take this when to take this additional instructions   spironolactone 25 MG tablet Commonly known as: ALDACTONE Take 1 tablet (25 mg total) by mouth daily.   tamsulosin 0.4 MG Caps capsule Commonly known as: FLOMAX Take 1 capsule (0.4 mg total) by mouth daily. Start taking on: November 18, 2021        Follow-up Information     Frann Rider, NP. Go on 12/27/2021.   Specialty: Neurology Why: stroke clinic Contact information: [address] [place] [address] [phone]                Allergies  Allergen Reactions   Phenytoin Sodium Extended Swelling and Other (See Comments)    Dilantin- Arm swelling    Penicillins Rash    Has patient had a PCN reaction causing immediate rash, facial/tongue/throat swelling, SOB or lightheadedness with hypotension: Unknown Has patient had a PCN reaction causing severe rash involving mucus membranes or skin necrosis: Unk Has patient had a PCN reaction that required hospitalization: Unk Has patient had a PCN reaction occurring within the last 10 years: Yes If all of the above answers are "NO", then may proceed with Cephalosporin use.     Consultations: Neurology   Procedures/Studies: CT ANGIO HEAD NECK W WO CM  Result Date: 11/13/2021 CLINICAL DATA:  Stroke/TIA EXAM: CT ANGIOGRAPHY HEAD AND NECK TECHNIQUE: Multidetector CT imaging of the head and neck was performed using the standard protocol during bolus  administration of intravenous contrast. Multiplanar CT image reconstructions and MIPs were obtained to evaluate the vascular anatomy. Carotid stenosis measurements (when applicable) are obtained utilizing NASCET criteria, using the distal internal carotid diameter as the denominator. RADIATION DOSE REDUCTION: This exam was performed according to the departmental dose-optimization program which includes automated exposure control, adjustment of the mA and/or kV according to patient size and/or use of iterative reconstruction technique. CONTRAST:  61m OMNIPAQUE IOHEXOL 350 MG/ML SOLN COMPARISON:  None Available. FINDINGS: CT HEAD FINDINGS Brain: There is no mass, hemorrhage or extra-axial  collection. Old right hemisphere encephalomalacia with ex vacuo dilatation of the right lateral ventricle. There is hypoattenuation of the periventricular white matter, most commonly indicating chronic ischemic microangiopathy. Skull: The visualized skull base, calvarium and extracranial soft tissues are normal. Sinuses/Orbits: No fluid levels or advanced mucosal thickening of the visualized paranasal sinuses. No mastoid or middle ear effusion. The orbits are normal. CTA NECK FINDINGS SKELETON: There is no bony spinal canal stenosis. No lytic or blastic lesion. OTHER NECK: Normal pharynx, larynx and major salivary glands. No cervical lymphadenopathy. Unremarkable thyroid gland. UPPER CHEST: No pneumothorax or pleural effusion. No nodules or masses. AORTIC ARCH: There is no calcific atherosclerosis of the aortic arch. There is no aneurysm, dissection or hemodynamically significant stenosis of the visualized portion of the aorta. Conventional 3 vessel aortic branching pattern. The visualized proximal subclavian arteries are widely patent. RIGHT CAROTID SYSTEM: Normal without aneurysm, dissection or stenosis. LEFT CAROTID SYSTEM: Normal without aneurysm, dissection or stenosis. VERTEBRAL ARTERIES: Left dominant configuration. Both  origins are clearly patent. There is no dissection, occlusion or flow-limiting stenosis to the skull base (V1-V3 segments). CTA HEAD FINDINGS POSTERIOR CIRCULATION: --Vertebral arteries: Normal V4 segments. --Inferior cerebellar arteries: Normal. --Basilar artery: Normal. --Superior cerebellar arteries: Normal. --Posterior cerebral arteries (PCA): Normal. ANTERIOR CIRCULATION: --Intracranial internal carotid arteries: Normal. --Anterior cerebral arteries (ACA): Normal. Both A1 segments are present. Patent anterior communicating artery (a-comm). --Middle cerebral arteries (MCA): Normal. VENOUS SINUSES: As permitted by contrast timing, patent. ANATOMIC VARIANTS: Fetal origin of the left posterior cerebral artery. Review of the MIP images confirms the above findings. IMPRESSION: 1. No emergent large vessel occlusion or hemodynamically significant stenosis of the head or neck. 2. Old right hemisphere encephalomalacia and findings of chronic ischemic microangiopathy. Electronically Signed   By: Ulyses Jarred M.D.   On: 11/13/2021 20:30   ECHOCARDIOGRAM COMPLETE  Result Date: 11/13/2021    ECHOCARDIOGRAM REPORT   Patient Name:   LADD CEN Date of Exam: 11/13/2021 Medical Rec #:  [id number]    Height:       72.0 in Accession #:    [id number]   Weight:       160.0 lb Date of Birth:  1955-04-09    BSA:          1.938 m Patient Age:    39 years     BP:           181/120 mmHg Patient Gender: M            HR:           75 bpm. Exam Location:  Inpatient Procedure: 2D Echo, Cardiac Doppler and Color Doppler Indications:    Stroke I63.9  History:        Patient has prior history of Echocardiogram examinations, most                 recent 03/28/2019. Stroke; Risk Factors:Hypertension and                 Dyslipidemia.  Sonographer:    Bernadene Person RDCS Referring Phys: [id number] Rosalin Hawking  Sonographer Comments: Image acquisition challenging due to respiratory motion. IMPRESSIONS  1. Left ventricular ejection fraction, by  estimation, is 60 to 65%. Left ventricular ejection fraction by 2D MOD biplane is 63.3 %. The left ventricle has normal function. The left ventricle has no regional wall motion abnormalities. Left ventricular diastolic parameters are consistent with Grade I diastolic dysfunction (impaired relaxation).  2. Right ventricular systolic function is normal. The right  ventricular size is normal. There is normal pulmonary artery systolic pressure. The estimated right ventricular systolic pressure is 42.7 mmHg.  3. The mitral valve is abnormal. Trivial mitral valve regurgitation.  4. The aortic valve is tricuspid. Aortic valve regurgitation is mild. Aortic valve sclerosis/calcification is present, without any evidence of aortic stenosis. Aortic regurgitation PHT measures 568 msec.  5. The inferior vena cava is normal in size with greater than 50% respiratory variability, suggesting right atrial pressure of 3 mmHg. Comparison(s): Changes from prior study are noted. 03/08/2019: LVEF 60-65%, trivial AI. FINDINGS  Left Ventricle: Left ventricular ejection fraction, by estimation, is 60 to 65%. Left ventricular ejection fraction by 2D MOD biplane is 63.3 %. The left ventricle has normal function. The left ventricle has no regional wall motion abnormalities. The left ventricular internal cavity size was normal in size. There is no left ventricular hypertrophy. Left ventricular diastolic parameters are consistent with Grade I diastolic dysfunction (impaired relaxation). Indeterminate filling pressures. Right Ventricle: The right ventricular size is normal. No increase in right ventricular wall thickness. Right ventricular systolic function is normal. There is normal pulmonary artery systolic pressure. The tricuspid regurgitant velocity is 2.31 m/s, and  with an assumed right atrial pressure of 3 mmHg, the estimated right ventricular systolic pressure is 06.2 mmHg. Left Atrium: Left atrial size was normal in size. Right Atrium: Right  atrial size was normal in size. Pericardium: There is no evidence of pericardial effusion. Mitral Valve: The mitral valve is abnormal. Mild to moderate mitral annular calcification. Trivial mitral valve regurgitation. Tricuspid Valve: The tricuspid valve is grossly normal. Tricuspid valve regurgitation is trivial. Aortic Valve: The aortic valve is tricuspid. Aortic valve regurgitation is mild. Aortic regurgitation PHT measures 568 msec. Aortic valve sclerosis/calcification is present, without any evidence of aortic stenosis. Pulmonic Valve: The pulmonic valve was normal in structure. Pulmonic valve regurgitation is not visualized. Aorta: The aortic root and ascending aorta are structurally normal, with no evidence of dilitation. Venous: The inferior vena cava is normal in size with greater than 50% respiratory variability, suggesting right atrial pressure of 3 mmHg. IAS/Shunts: No atrial level shunt detected by color flow Doppler.  LEFT VENTRICLE PLAX 2D                        Biplane EF (MOD) LVIDd:         3.90 cm         LV Biplane EF:   Left LVIDs:         2.30 cm                          ventricular LV PW:         1.00 cm                          ejection LV IVS:        1.00 cm                          fraction by LVOT diam:     2.20 cm                          2D MOD LV SV:         90  biplane is LV SV Index:   47                               63.3 %. LVOT Area:     3.80 cm                                Diastology                                LV e' medial:    6.60 cm/s LV Volumes (MOD)               LV E/e' medial:  14.4 LV vol d, MOD    86.6 ml       LV e' lateral:   7.43 cm/s A2C:                           LV E/e' lateral: 12.8 LV vol d, MOD    83.6 ml A4C: LV vol s, MOD    31.7 ml A2C: LV vol s, MOD    31.8 ml A4C: LV SV MOD A2C:   54.9 ml LV SV MOD A4C:   83.6 ml LV SV MOD BP:    56.5 ml RIGHT VENTRICLE RV S prime:     15.20 cm/s TAPSE (M-mode): 2.6 cm LEFT ATRIUM              Index        RIGHT ATRIUM           Index LA diam:        4.00 cm 2.06 cm/m   RA Area:     13.10 cm LA Vol (A2C):   50.0 ml 25.80 ml/m  RA Volume:   29.30 ml  15.12 ml/m LA Vol (A4C):   52.5 ml 27.09 ml/m LA Biplane Vol: 51.4 ml 26.52 ml/m  AORTIC VALVE LVOT Vmax:         109.00 cm/s LVOT Vmean:        74.500 cm/s LVOT VTI:          0.238 m AI PHT:            568 msec AR Vena Contracta: 0.30 cm  AORTA Ao Root diam: 3.50 cm Ao Asc diam:  3.50 cm MITRAL VALVE                TRICUSPID VALVE MV Area (PHT): 2.20 cm     TR Peak grad:   21.3 mmHg MV Decel Time: 345 msec     TR Vmax:        231.00 cm/s MV E velocity: 95.30 cm/s MV A velocity: 110.00 cm/s  SHUNTS MV E/A ratio:  0.87         Systemic VTI:  0.24 m                             Systemic Diam: 2.20 cm Lyman Bishop MD Electronically signed by Lyman Bishop MD Signature Date/Time: 11/13/2021/4:20:39 PM    Final    MR BRAIN WO CONTRAST  Result Date: 11/12/2021 CLINICAL DATA:  Confusion, history of stroke and seizure disorder; febrile EXAM: MRI HEAD WITHOUT CONTRAST TECHNIQUE: Multiplanar, multiecho pulse sequences of the brain and  surrounding structures were obtained without intravenous contrast. COMPARISON:  04/20/2019 MRI head correlation is also made with CT head 11/11/2021 FINDINGS: Evaluation is somewhat limited by motion artifact. Brain: Small focus of restricted diffusion with ADC correlate in the left frontal white matter (series 7, image 62). Redemonstrated large area of encephalomalacia in the right cerebral hemisphere,, the site of a remote intraparenchymal hematoma. No acute hemorrhage, mass, mass effect, or midline shift. No hydrocephalus or extra-axial collection. Vascular: Normal proximal arterial flow voids. Skull and upper cervical spine: Normal marrow signal. Sinuses/Orbits: Mild mucosal thickening in the maxillary sinuses. Status post bilateral lens replacements. Other: The mastoids are well aerated. IMPRESSION: 1. Small acute  infarct in the left frontal white matter. 2. Redemonstrated large area of encephalomalacia in the right cerebral hemisphere, the site of a remote intraparenchymal hematoma. These results will be called to the ordering clinician or representative by the Radiologist Assistant, and communication documented in the PACS or [employer]. Electronically Signed   By: Merilyn Baba M.D.   On: 11/12/2021 02:46   CT CHEST WO CONTRAST  Result Date: 11/11/2021 CLINICAL DATA:  COVID-19 with hypoxia EXAM: CT CHEST WITHOUT CONTRAST TECHNIQUE: Multidetector CT imaging of the chest was performed following the standard protocol without IV contrast. RADIATION DOSE REDUCTION: This exam was performed according to the departmental dose-optimization program which includes automated exposure control, adjustment of the mA and/or kV according to patient size and/or use of iterative reconstruction technique. COMPARISON:  Radiographs 11/11/2021 FINDINGS: Cardiovascular: Mitral annular calcification. Mild coronary artery calcification. Aortic valve calcification. Normal heart size. No pericardial effusion. Mediastinum/Nodes: Unremarkable thyroid. No thoracic adenopathy. Unremarkable esophagus. Lungs/Pleura: Bilateral lower lobe atelectasis/scarring with architectural distortion and mild bronchiolectasis. Posterior mild reticular opacities. Focal thickening in the right major fissure may be a small amount of fluid. No pneumothorax. Small amount of layering secretions in the trachea. Upper Abdomen: No acute abnormality. Musculoskeletal: No chest wall mass or suspicious bone lesions identified. IMPRESSION: Chronic scarring greatest in the bilateral lower lobes. Mild amount of layering secretions in the trachea. Findings could be due to chronic aspiration. No focal pneumonia. Electronically Signed   By: Placido Sou M.D.   On: 11/11/2021 23:07   CT HEAD WO CONTRAST (5MM)  Result Date: 11/11/2021 CLINICAL DATA:  Mental status change  EXAM: CT HEAD WITHOUT CONTRAST TECHNIQUE: Contiguous axial images were obtained from the base of the skull through the vertex without intravenous contrast. RADIATION DOSE REDUCTION: This exam was performed according to the departmental dose-optimization program which includes automated exposure control, adjustment of the mA and/or kV according to patient size and/or use of iterative reconstruction technique. COMPARISON:  CT head 05/13/2009, MRI Brain 04/20/2018 FINDINGS: Brain: Redemonstrated large area of encephalomalacia in the right cerebral hemisphere in the frontoparietal region, at a site of remote intraparenchymal hemorrhage. Ex vacuo dilatation of the right ventricular atrium, unchanged from prior exam. No new hemorrhage. No midline shift. No extra-axial fluid collection. Compared to prior exam there is a new area of hypodensity abutting the left occipital horn (series 7, image 19). This was not seen on prior CT and definitively visualized on the recent brain MRI. Vascular: No hyperdense vessel or unexpected calcification. Skull: Normal. Negative for fracture or focal lesion. Sinuses/Orbits: No acute finding. Mild mucosal thickening bilateral maxillary sinus with osseous findings of chronic right maxillary sinusitis. Bilateral mastoid air cells are clear. Bilateral lens replacements. Other: None IMPRESSION: New area of hypodensity abutting the left occipital horn, which was not seen on prior CT and  definitively visualized on the recent brain MRI. This may represent sequela of chronic microvascular ischemic change, but if there is clinical concern for an acute infarct, further evaluation with a brain MRI is recommended. Electronically Signed   By: Marin Roberts M.D.   On: 11/11/2021 19:48   DG Chest Portable 1 View  Result Date: 11/11/2021 CLINICAL DATA:  , History of stroke and LEFT side deficits, altered mental status EXAM: PORTABLE CHEST 1 VIEW COMPARISON:  Portable exam 1427 hours compared to  04/20/2019 FINDINGS: Upper normal heart size. Mediastinal contours and pulmonary vascularity normal. Minimal RIGHT basilar atelectasis. Lungs otherwise clear. No infiltrate, pleural effusion, or pneumothorax. Bones demineralized. IMPRESSION: Minimal RIGHT basilar atelectasis. Electronically Signed   By: Lavonia Dana M.D.   On: 11/11/2021 14:43      Subjective: Patient denies any complaints today, he is eager to go home.  Discharge Exam: Vitals:   11/17/21 0300 11/17/21 0901  BP: (!) 141/78 (!) 149/90  Pulse: 70 92  Resp: 18 14  Temp: 97.8 F (36.6 C)   SpO2: 95%    Vitals:   11/16/21 1900 11/17/21 0011 11/17/21 0300 11/17/21 0901  BP: (!) 149/84 133/68 (!) 141/78 (!) 149/90  Pulse: 68 71 70 92  Resp: '20 19 18 14  '$ Temp: 98 F (36.7 C) 98.3 F (36.8 C) 97.8 F (36.6 C)   TempSrc: Oral Oral Oral Oral  SpO2: 94% 96% 95%   Weight:      Height:        General: Pt is alert, awake, not in acute distress old left-sided hemiparesis Cardiovascular: RRR, S1/S2 +, no rubs, no gallops Respiratory: CTA bilaterally, no wheezing, no rhonchi Abdominal: Soft, NT, ND, bowel sounds + Extremities: no edema, no cyanosis    The results of significant diagnostics from this hospitalization (including imaging, microbiology, ancillary and laboratory) are listed below for reference.     Microbiology: Recent Results (from the past 240 hour(s))  Resp Panel by RT-PCR (Flu A&B, Covid) Anterior Nasal Swab     Status: Abnormal   Collection Time: 11/11/21  2:16 PM   Specimen: Anterior Nasal Swab  Result Value Ref Range Status   SARS Coronavirus 2 by RT PCR POSITIVE (A) NEGATIVE Final    Comment: (NOTE) SARS-CoV-2 target nucleic acids are DETECTED.  The SARS-CoV-2 RNA is generally detectable in upper respiratory specimens during the acute phase of infection. Positive results are indicative of the presence of the identified virus, but do not rule out bacterial infection or co-infection with other  pathogens not detected by the test. Clinical correlation with patient history and other diagnostic information is necessary to determine patient infection status. The expected result is Negative.  Fact Sheet for Patients: EntrepreneurPulse.com.au  Fact Sheet for Healthcare Providers: IncredibleEmployment.be  This test is not yet approved or cleared by the [place] FDA and  has been authorized for detection and/or diagnosis of SARS-CoV-2 by FDA under an Emergency Use Authorization (EUA).  This EUA will remain in effect (meaning this test can be used) for the duration of  the COVID-19 declaration under Section 564(b)(1) of the A ct, 21 U.S.C. section 360bbb-3(b)(1), unless the authorization is terminated or revoked sooner.     Influenza A by PCR NEGATIVE NEGATIVE Final   Influenza B by PCR NEGATIVE NEGATIVE Final    Comment: (NOTE) The Xpert Xpress SARS-CoV-2/FLU/RSV plus assay is intended as an aid in the diagnosis of influenza from Nasopharyngeal swab specimens and should not be used as a sole  basis for treatment. Nasal washings and aspirates are unacceptable for Xpert Xpress SARS-CoV-2/FLU/RSV testing.  Fact Sheet for Patients: EntrepreneurPulse.com.au  Fact Sheet for Healthcare Providers: IncredibleEmployment.be  This test is not yet approved or cleared by the [place] FDA and has been authorized for detection and/or diagnosis of SARS-CoV-2 by FDA under an Emergency Use Authorization (EUA). This EUA will remain in effect (meaning this test can be used) for the duration of the COVID-19 declaration under Section 564(b)(1) of the Act, 21 U.S.C. section 360bbb-3(b)(1), unless the authorization is terminated or revoked.  Performed at [facility], [place] [address] [place], [place] [address]   Urine Culture     Status: Abnormal   Collection Time: 11/13/21  2:51 AM   Specimen: Urine, Clean  Catch  Result Value Ref Range Status   Specimen Description URINE, CLEAN CATCH  Final   Special Requests NONE  Final   Culture (A)  Final    <10,000 COLONIES/mL INSIGNIFICANT GROWTH Performed at [facility], [place] [address], [place], [place] [address]    Report Status 11/14/2021 FINAL  Final     Labs: BNP (last 3 results) Recent Labs    11/15/21 0227 11/16/21 0335 11/17/21 0323  BNP 119.4* 110.2* 092.3*   Basic Metabolic Panel: Recent Labs  Lab 11/11/21 1550 11/11/21 2330 11/13/21 0157 11/13/21 1453 11/14/21 0306 11/15/21 0227 11/16/21 0335 11/16/21 1828 11/17/21 0323  NA  --    < > 142 143 142 139 144  --  141  K  --    < > 2.5* 2.9* 2.6* 2.8* 2.7* 2.8* 3.5  CL  --    < > 106 103 105 103 110  --  111  CO2  --    < > '26 26 28 24 26  '$ --  22  GLUCOSE  --    < > 146* 126* 133* 128* 110*  --  109*  BUN  --    < > '18 14 16 19 '$ 24*  --  13  CREATININE  --    < > 0.98 0.87 0.81 0.84 1.09  --  0.86  CALCIUM  --    < > 8.5* 8.3* 8.4* 8.3* 8.7*  --  8.3*  MG 2.1  --  1.7  --  1.8  --  2.2  --  1.8  PHOS  --   --  2.3*  --  3.2 2.3* 2.9  --   --    < > = values in this interval not displayed.   Liver Function Tests: Recent Labs  Lab 11/12/21 0611 11/13/21 0157 11/14/21 0306 11/16/21 0335 11/17/21 0323  AST 79* 59* 47* 38 34  ALT 40 33 35 48* 46*  ALKPHOS 67 60 71 53 58  BILITOT 1.1 0.4 0.4 0.5 0.3  PROT 6.1* 6.0* 5.9* 5.4* 5.4*  ALBUMIN 3.4* 3.2* 3.2* 3.0* 3.0*   No results for input(s): "LIPASE", "AMYLASE" in the last 168 hours. No results for input(s): "AMMONIA" in the last 168 hours. CBC: Recent Labs  Lab 11/11/21 1430 11/12/21 0611 11/13/21 0157 11/14/21 0306 11/15/21 0227 11/16/21 0335 11/17/21 0323  WBC 4.1   < > 2.8* 5.4 5.3 5.8 7.2  NEUTROABS 2.6  --  1.9 4.3  --  3.6 4.8  HGB 11.6*   < > 12.4* 12.6* 12.4* 11.6* 12.1*  HCT 34.0*   < > 34.8* 36.3* 35.9* 32.7* 36.1*  MCV 85.9   < > 83.1 83.3 85.1 83.6 85.3  PLT 134*   < > 103* 100* 94* 83*  89*   < > = values in this interval not displayed.   Cardiac Enzymes: No results for input(s): "CKTOTAL", "CKMB", "CKMBINDEX", "TROPONINI" in the last 168 hours. BNP: Invalid input(s): "POCBNP" CBG: Recent Labs  Lab 11/11/21 1457  GLUCAP 116*   D-Dimer No results for input(s): "DDIMER" in the last 72 hours. Hgb A1c No results for input(s): "HGBA1C" in the last 72 hours. Lipid Profile No results for input(s): "CHOL", "HDL", "LDLCALC", "TRIG", "CHOLHDL", "LDLDIRECT" in the last 72 hours. Thyroid function studies No results for input(s): "TSH", "T4TOTAL", "T3FREE", "THYROIDAB" in the last 72 hours.  Invalid input(s): "FREET3" Anemia work up No results for input(s): "VITAMINB12", "FOLATE", "FERRITIN", "TIBC", "IRON", "RETICCTPCT" in the last 72 hours. Urinalysis    Component Value Date/Time   COLORURINE STRAW (A) 11/12/2021 0658   APPEARANCEUR CLEAR 11/12/2021 0658   LABSPEC 1.006 11/12/2021 0658   LABSPEC 1.015 11/23/2016 1357   PHURINE 7.0 11/12/2021 0658   GLUCOSEU NEGATIVE 11/12/2021 0658   HGBUR MODERATE (A) 11/12/2021 0658   BILIRUBINUR NEGATIVE 11/12/2021 0658   BILIRUBINUR negative 11/23/2016 1357   BILIRUBINUR n 11/19/2015 1034   KETONESUR 20 (A) 11/12/2021 0658   PROTEINUR 30 (A) 11/12/2021 0658   UROBILINOGEN negative 11/19/2015 1034   UROBILINOGEN 0.2 04/24/2009 1604   NITRITE NEGATIVE 11/12/2021 0658   LEUKOCYTESUR NEGATIVE 11/12/2021 0658   Sepsis Labs Recent Labs  Lab 11/14/21 0306 11/15/21 0227 11/16/21 0335 11/17/21 0323  WBC 5.4 5.3 5.8 7.2   Microbiology Recent Results (from the past 240 hour(s))  Resp Panel by RT-PCR (Flu A&B, Covid) Anterior Nasal Swab     Status: Abnormal   Collection Time: 11/11/21  2:16 PM   Specimen: Anterior Nasal Swab  Result Value Ref Range Status   SARS Coronavirus 2 by RT PCR POSITIVE (A) NEGATIVE Final    Comment: (NOTE) SARS-CoV-2 target nucleic acids are DETECTED.  The SARS-CoV-2 RNA is generally detectable  in upper respiratory specimens during the acute phase of infection. Positive results are indicative of the presence of the identified virus, but do not rule out bacterial infection or co-infection with other pathogens not detected by the test. Clinical correlation with patient history and other diagnostic information is necessary to determine patient infection status. The expected result is Negative.  Fact Sheet for Patients: EntrepreneurPulse.com.au  Fact Sheet for Healthcare Providers: IncredibleEmployment.be  This test is not yet approved or cleared by the [place] FDA and  has been authorized for detection and/or diagnosis of SARS-CoV-2 by FDA under an Emergency Use Authorization (EUA).  This EUA will remain in effect (meaning this test can be used) for the duration of  the COVID-19 declaration under Section 564(b)(1) of the A ct, 21 U.S.C. section 360bbb-3(b)(1), unless the authorization is terminated or revoked sooner.     Influenza A by PCR NEGATIVE NEGATIVE Final   Influenza B by PCR NEGATIVE NEGATIVE Final    Comment: (NOTE) The Xpert Xpress SARS-CoV-2/FLU/RSV plus assay is intended as an aid in the diagnosis of influenza from Nasopharyngeal swab specimens and should not be used as a sole basis for treatment. Nasal washings and aspirates are unacceptable for Xpert Xpress SARS-CoV-2/FLU/RSV testing.  Fact Sheet for Patients: EntrepreneurPulse.com.au  Fact Sheet for Healthcare Providers: IncredibleEmployment.be  This test is not yet approved or cleared by the [place] FDA and has been authorized for detection and/or diagnosis of SARS-CoV-2 by FDA under an Emergency Use Authorization (EUA). This EUA  will remain in effect (meaning this test can be used) for the duration of the COVID-19 declaration under Section 564(b)(1) of the Act, 21 U.S.C. section 360bbb-3(b)(1), unless the authorization  is terminated or revoked.  Performed at [facility], [place] [address] [place], [place] [address]   Urine Culture     Status: Abnormal   Collection Time: 11/13/21  2:51 AM   Specimen: Urine, Clean Catch  Result Value Ref Range Status   Specimen Description URINE, CLEAN CATCH  Final   Special Requests NONE  Final   Culture (A)  Final    <10,000 COLONIES/mL INSIGNIFICANT GROWTH Performed at [facility], [place] [address] [place], [place] [address]    Report Status 11/14/2021 FINAL  Final     Time coordinating discharge: Over 30 minutes  SIGNED:   Phillips Climes, MD  [facility] 11/17/2021, 9:55 AM Pager   If 7PM-7AM, please contact night-coverage [email or web address]

## 2021-11-18 ENCOUNTER — Telehealth: Payer: Self-pay

## 2021-11-18 NOTE — Telephone Encounter (Signed)
Transition Care Management Follow-up Telephone Call Date of discharge and from where: [facility] 11/17/21 How have you been since you were released from the hospital? [facility]  Any questions or concerns? No  Items Reviewed: Did the pt receive and understand the discharge instructions provided? Yes  Medications obtained and verified? Yes  Other? No  Any new allergies since your discharge? No  Dietary orders reviewed? Yes Do you have support at home? Yes   Home Care and Equipment/Supplies: Were home health services ordered? yes If so, what is the name of the agency? [facility]  Has the agency set up a time to come to the patient's home? yes  Follow up appointments reviewed:  PCP Hospital f/u appt confirmed? Yes  Scheduled to see Dorothea Ogle on 11/22/21 @ 2:15. [facility] f/u appt confirmed? Yes  Scheduled to see [facility] on 12/27/21. Are transportation arrangements needed? No  If their condition worsens, is the pt aware to call PCP or go to the Emergency Dept.? Yes Was the patient provided with contact information for the PCP's office or ED? Yes Was to pt encouraged to call back with questions or concerns? Yes

## 2021-11-18 NOTE — Telephone Encounter (Signed)
Pt got out of hospital and has upcoming apt with shane soon

## 2021-11-19 ENCOUNTER — Telehealth: Payer: Self-pay

## 2021-11-19 ENCOUNTER — Other Ambulatory Visit: Payer: Self-pay | Admitting: Medical

## 2021-11-19 MED ORDER — LORAZEPAM 0.5 MG PO TABS: 0.5000 mg | ORAL_TABLET | Freq: Two times a day (BID) | ORAL | 0 refills | Status: DC | PRN

## 2021-11-19 NOTE — Telephone Encounter (Signed)
Pt. Wife called stating he is having a lot of anxiety since being released from the hospital. She wanted to know if he could be put on something low dose for anxiety doesn't want anything to strong. She also said he has being have fits of anger as well. He is coming in Monday for a hospital f/u and she will bring all of her medication in then.

## 2021-11-22 ENCOUNTER — Ambulatory Visit (INDEPENDENT_AMBULATORY_CARE_PROVIDER_SITE_OTHER): Payer: Medicare Other | Admitting: Medical

## 2021-11-22 VITALS — BP 100/54 | HR 80 | Wt 165.4 lb

## 2021-11-22 DIAGNOSIS — G9341 Metabolic encephalopathy: Secondary | ICD-10-CM | POA: Diagnosis not present

## 2021-11-22 DIAGNOSIS — E876 Hypokalemia: Secondary | ICD-10-CM

## 2021-11-22 DIAGNOSIS — Z993 Dependence on wheelchair: Secondary | ICD-10-CM | POA: Diagnosis not present

## 2021-11-22 DIAGNOSIS — I10 Essential (primary) hypertension: Secondary | ICD-10-CM | POA: Diagnosis not present

## 2021-11-22 DIAGNOSIS — J9601 Acute respiratory failure with hypoxia: Secondary | ICD-10-CM

## 2021-11-22 DIAGNOSIS — U071 COVID-19: Secondary | ICD-10-CM

## 2021-11-22 DIAGNOSIS — G479 Sleep disorder, unspecified: Secondary | ICD-10-CM

## 2021-11-22 DIAGNOSIS — F430 Acute stress reaction: Secondary | ICD-10-CM

## 2021-11-22 DIAGNOSIS — G8114 Spastic hemiplegia affecting left nondominant side: Secondary | ICD-10-CM

## 2021-11-22 DIAGNOSIS — N179 Acute kidney failure, unspecified: Secondary | ICD-10-CM | POA: Diagnosis not present

## 2021-11-22 DIAGNOSIS — G40909 Epilepsy, unspecified, not intractable, without status epilepticus: Secondary | ICD-10-CM | POA: Diagnosis not present

## 2021-11-22 DIAGNOSIS — H9190 Unspecified hearing loss, unspecified ear: Secondary | ICD-10-CM | POA: Insufficient documentation

## 2021-11-22 DIAGNOSIS — R252 Cramp and spasm: Secondary | ICD-10-CM

## 2021-11-22 DIAGNOSIS — I69398 Other sequelae of cerebral infarction: Secondary | ICD-10-CM | POA: Diagnosis not present

## 2021-11-22 DIAGNOSIS — I6930 Unspecified sequelae of cerebral infarction: Secondary | ICD-10-CM | POA: Diagnosis not present

## 2021-11-22 DIAGNOSIS — R451 Restlessness and agitation: Secondary | ICD-10-CM

## 2021-11-22 DIAGNOSIS — D696 Thrombocytopenia, unspecified: Secondary | ICD-10-CM | POA: Diagnosis not present

## 2021-11-22 MED ORDER — LORAZEPAM 0.5 MG PO TABS: 0.5000 mg | ORAL_TABLET | Freq: Two times a day (BID) | ORAL | 0 refills | Status: DC | PRN

## 2021-11-22 NOTE — Progress Notes (Signed)
Subjective:  Steve Burton is a 66 y.o. male who presents for Chief Complaint  Patient presents with   Hospitalization Follow-up    Hospital follow-up, no more fever, mad and depression- wife says he wants to kill himself but he says he is fine. , wife says he cries all the time. Wife says she has had to hide all the knifes in the house      Admit date: 11/11/2021 Discharge date: 11/17/2021  Here for hospital follow-up.  Discharge Diagnoses:  Principal Problem:   COVID-19 virus infection Active Problems:   Essential hypertension   Left spastic hemiparesis (HCC)   Spasticity due to old stroke   Hyperlipidemia   Acute hypoxemic respiratory failure (HCC)   Acute metabolic encephalopathy   Urinary incontinence   Normocytic anemia   Elevated transaminase level   Thrombocytopenia (HCC)   Hypokalemia   AKI (acute kidney injury) ([place])   Seizure disorder ([place])  Notes per hospital discharge summary: He has underlying history of hypertension, hyperlipidemia, stroke, left spastic hemiparesis, seizure disorder that came down with COVID on 11/09/2021.  We initially did a virtual consult on 11/11/2021, and at that time given his confusion and lethargy I recommend he go to the emergency department.  EMS was ultimately called out since wife is unable to take care of him and get him up and out of the house  In the emergency department he had fever, URI symptoms, confusion, urinary incontinence.  He and his wife both were positive for COVID around the same time after coming back from a trip in [place].  In the emergency department he was febrile with a fever of 102.5 F, oxygen sat as low as 89% on room air.  He was placed on 2 L of oxygen.  He was confused and unable to respond to questions initially.  He is having difficult to control his bladder.  With this hospitalization he was treated for COVID virus infection and acute hypoxemic respiratory failure, treated with molnupiravir, Decadron, CRP was  stable and by the time of discharge was on room air  He had acute metabolic encephalopathy due to hypoxia.  There was a finding on scan of acute CVA but small.  Confusion resolved by the time of discharge and urine culture showed no significant growth  Acute CVA-small infarct in the left frontal white matter on MRI this hospitalization.  He was continued on his regimen of aspirin and statin for secondary prevention.  He has prior had intracranial hemorrhage so other treatment was not advised.  He apparently refused therapy in the hospital including PT, OT and speech therapy.  However home therapy for PT and OT was ordered at wife's request  History of ICH with residual left spastic hemiparesis-Redemonstrated large area of encephalomalacia in the right cerebral hemisphere, the site of a remote intraparenchymal hematoma, not on antiplatelet at home.   Seizure disorder, continue home meds keppra.    Urinary incontinence -Stable on Flomax.   Mild elevation of transaminase Likely due to acute acute covid viral infection. Improving.   Mild thrombocytopenia/lymphopenia Likely due to acute viral infection. Continue to monitor.   Hypokalemia severe and persistent.  -  Aggressively replaced IV and p.o., potassium, this morning at 3.5, he is to be resumed on his home dose Aldactone and potassium supplement, but to increase dosing to twice daily potassium for next 3 days.     AKI Resolved after hydration.    Hypertension: Multiple days out of acute CVA, blood pressure medications  readjusted on 11/16/2021 for better control.  To resume home meds, hydralazine has been added as well.   Hyperlipidemia; continue statin.  He refused therapy such as OT, PT and speech in initially in the hospital, he noted where 6 days of therapy was ordered.  He refused as he thought this would mean he be hospitalized 6 full days.  He notes that is why he refused therapy in the hospital.  No other aggravating or relieving  factors.    No other c/o.   Past Medical History:  Diagnosis Date   Elevated LDL cholesterol level    Elevated PSA measurement 11/22/2016   Hyperlipidemia    Hypertension    Impaired fasting glucose 11/22/2016   Left hemiparesis (HCC)    Spastic hemiparesis ([place])    Stroke [facility]) 2011   Current Outpatient Medications on File Prior to Visit  Medication Sig Dispense Refill   acetaminophen (TYLENOL) 500 MG tablet Take 500 mg by mouth every 6 (six) hours as needed for mild pain.     aspirin EC 81 MG tablet Take 1 tablet (81 mg total) by mouth daily. Swallow whole. 30 tablet 0   atorvastatin (LIPITOR) 10 MG tablet TAKE 1 TABLET BY MOUTH DAILY 90 tablet 3   baclofen (LIORESAL) 10 MG tablet Take 1 tablet (10 mg total) by mouth 3 (three) times daily. 270 tablet 0   diltiazem (CARDIZEM CD) 240 MG 24 hr capsule TAKE 1 CAPSULE BY MOUTH DAILY 100 capsule 3   hydrALAZINE (APRESOLINE) 50 MG tablet Take 1 tablet (50 mg total) by mouth 3 (three) times daily. 90 tablet 1   labetalol (NORMODYNE) 100 MG tablet TAKE 1 TABLET BY MOUTH TWICE  DAILY 200 tablet 3   levETIRAcetam (KEPPRA) 500 MG tablet TAKE 1 TABLET BY MOUTH TWICE  DAILY 200 tablet 2   LORazepam (ATIVAN) 0.5 MG tablet Take 1 tablet (0.5 mg total) by mouth 2 (two) times daily as needed for anxiety. 20 tablet 0   losartan (COZAAR) 100 MG tablet TAKE 1 TABLET BY MOUTH DAILY 100 tablet 3   potassium chloride SA (KLOR-CON M) 20 MEQ tablet Take 40 mEq oral twice daily x2 days, then back to 40 mEq oral daily. (Patient taking differently: Take 40 mEq by mouth once. Take 40 mEq oral twice daily x2 days, then back to 40 mEq oral daily.) 120 tablet 0   spironolactone (ALDACTONE) 25 MG tablet Take 1 tablet (25 mg total) by mouth daily. 30 tablet 1   tamsulosin (FLOMAX) 0.4 MG CAPS capsule Take 1 capsule (0.4 mg total) by mouth daily. 30 capsule 1   No current facility-administered medications on file prior to visit.    The following portions of the  patient's history were reviewed and updated as appropriate: allergies, current medications, past family history, past medical history, past social history, past surgical history and problem list.  ROS Otherwise as in subjective above    Objective: BP (!) 100/54   Pulse 80   Wt 165 lb 6.4 oz (75 kg)   SpO2 97%   BMI 22.43 kg/m   BP Readings from Last 3 Encounters:  11/22/21 (!) 100/54  11/17/21 130/84  09/27/21 120/72    General appearance: alert, no distress, well developed, well nourished HEENT: normocephalic, sclerae anicteric, conjunctiva pink and moist, no discharge or erythema, pharynx normal Oral cavity: MMM, no lesions Neck: supple, no lymphadenopathy, no thyromegaly, no masses Heart: RRR, normal S1, S2, no murmurs Lungs: CTA bilaterally, no wheezes, rhonchi, or rales  Pulses: 2+ radial pulses, 2+ pedal pulses, normal cap refill Ext: no edema Psych: Answers questions, but seems agitated at times getting frustrated with some of our questions in some of his wife's responses. Neuro: Alert and oriented, chronic left sided hemiparesis but no other new deficit   Assessment: Encounter Diagnoses  Name Primary?   COVID-19 virus infection Yes   Left spastic hemiparesis (HCC)    Seizure disorder (HCC)    Acute metabolic encephalopathy    Acute hypoxemic respiratory failure (HCC)    Essential hypertension    Spasticity due to old stroke    Wheelchair dependence    Hypokalemia    History of hemorrhagic cerebrovascular accident (CVA) with residual deficit    Thrombocytopenia (HCC)    AKI (acute kidney injury) ([place])    Hearing loss, unspecified hearing loss type, unspecified laterality    Acute stress reaction    Sleep disturbance    Agitation      Plan: I reviewed his discharge summary, imaging, labs, notes from his recent hospitalization.  Medicines were reconciled.  His blood pressure medicine was adjusted in the hospital.  His labetalol was decreased from 300 mg  twice daily down to 100 mg twice daily but hydralazine was added.  He was continued on Cardizem and temporarily taken off losartan while kidneys improved.  His blood pressure is low today and he is still recovering.  I am going to have him hold his losartan for 3 days to give him more time to hydrate up and to recuperate some more  Acute kidney injury-discussed the causes from his recent hospitalization and virus and dehydration.  Recheck labs today  Hypokalemia in the hospital-recheck labs today, and he is currently on potassium supplement  Continue to work on good hydration  Agitation, insomnia, we discussed the significant illness and hospitalization recently.  We discussed that the fact that he had some encephalopathy and had some agitation and anger afterwards.  We will continue lorazepam for the temporary time being since he did respond to this well over the weekend when I called out lorazepam.  Wife is helping to dispense the medications..  I recommended counseling but he declines.  I also reiterated to him how I and his were helping to see him recover and to avoid agitation when every is simply trying to help hi.    I tried to reassure him that his body is still recovering & recuperating from the recent COVID illness.  We discussed the fact that he had a stroke, acute kidney injury, encephalopathy and that it takes sometimes weeks to fully get over this type of illness.  He was little irritated today and wife notes that he has been a little bit difficult to work with since coming home from the hospital.  He does feel like he got disrespected in the hospital when one of the doctors allegedly did not address him directly or even look at him.   Patient Instructions  Changes today: STOP the Losartan '100mg'$  blood pressure pill TEMPORARILY, then resume this Thursday, Thanksgiving day  In the hospital the Labetalol dose was decreased.  Current recommendations: Continue aspirin 81 mg  daily Continue atorvastatin Lipitor 10 mg daily Continue baclofen 3 times a day for muscle spasm Continue diltiazem blood pressure 240 mg daily in the morning Continue hydralazine 50 mg,were taking Continue labetalol 100 mg twice daily (lower dose than prior to hospital visit) Continue Keppra levetiracetam 500 mg twice daily Continue potassium 20 mEq, 2 tablets daily Continue  spironolactone 25 mg daily Take the Flomax 0.4 mg daily for 1 week and then you can try coming off of this to see how you do.  This was started new in the hospital visit due to urination issues   Jaimere was seen today for hospitalization follow-up.  Diagnoses and all orders for this visit:  COVID-19 virus infection -     Basic metabolic panel  Left spastic hemiparesis (HCC)  Seizure disorder (HCC)  Acute metabolic encephalopathy  Acute hypoxemic respiratory failure ([place])  Essential hypertension  Spasticity due to old stroke  Wheelchair dependence  Hypokalemia -     Basic metabolic panel  History of hemorrhagic cerebrovascular accident (CVA) with residual deficit  Thrombocytopenia (HCC)  AKI (acute kidney injury) ([place])  Hearing loss, unspecified hearing loss type, unspecified laterality  Acute stress reaction  Sleep disturbance  Agitation  Spent > 45 minutes face to face with patient in discussion of symptoms, evaluation, plan and recommendations.    Follow up: 2 weeks

## 2021-11-22 NOTE — Patient Instructions (Signed)
Changes today: STOP the Losartan '100mg'$  blood pressure pill TEMPORARILY, then resume this Thursday, Thanksgiving day  In the hospital the Labetalol dose was decreased.  Current recommendations: Continue aspirin 81 mg daily Continue atorvastatin Lipitor 10 mg daily Continue baclofen 3 times a day for muscle spasm Continue diltiazem blood pressure 240 mg daily in the morning Continue hydralazine 50 mg,were taking Continue labetalol 100 mg twice daily (lower dose than prior to hospital visit) Continue Keppra levetiracetam 500 mg twice daily Continue potassium 20 mEq, 2 tablets daily Continue spironolactone 25 mg daily Take the Flomax 0.4 mg daily for 1 week and then you can try coming off of this to see how you do.  This was started new in the hospital visit due to urination issues

## 2021-11-23 ENCOUNTER — Encounter: Payer: Self-pay | Admitting: Internal Medicine

## 2021-11-23 LAB — BASIC METABOLIC PANEL
BUN/Creatinine Ratio: 18 (ref 10–24)
BUN: 20 mg/dL (ref 8–27)
CO2: 21 mmol/L (ref 20–29)
Calcium: 9.7 mg/dL (ref 8.6–10.2)
Chloride: 107 mmol/L — ABNORMAL HIGH (ref 96–106)
Creatinine, Ser: 1.1 mg/dL (ref 0.76–1.27)
Glucose: 112 mg/dL — ABNORMAL HIGH (ref 70–99)
Potassium: 4.6 mmol/L (ref 3.5–5.2)
Sodium: 142 mmol/L (ref 134–144)
eGFR: 75 mL/min/{1.73_m2} (ref >59)

## 2021-11-23 NOTE — Addendum Note (Signed)
Addended by: Minette Headland A on: 11/23/2021 08:13 AM   Modules accepted: Orders

## 2021-11-23 NOTE — Progress Notes (Signed)
Put in PT and sent mychart message for pt to call to schedule

## 2021-11-29 MED ORDER — HYDRALAZINE HCL 50 MG PO TABS: 50.0000 mg | ORAL_TABLET | Freq: Three times a day (TID) | ORAL | 1 refills | Status: DC

## 2021-11-29 MED ORDER — TAMSULOSIN HCL 0.4 MG PO CAPS: 0.4000 mg | ORAL_CAPSULE | Freq: Every day | ORAL | 0 refills | Status: DC

## 2021-11-29 MED ORDER — POTASSIUM CHLORIDE CRYS ER 20 MEQ PO TBCR: EXTENDED_RELEASE_TABLET | ORAL | 0 refills | Status: DC

## 2021-12-02 ENCOUNTER — Other Ambulatory Visit: Payer: Self-pay | Admitting: Cardiology

## 2021-12-08 DIAGNOSIS — R262 Difficulty in walking, not elsewhere classified: Secondary | ICD-10-CM | POA: Diagnosis not present

## 2021-12-08 DIAGNOSIS — I69398 Other sequelae of cerebral infarction: Secondary | ICD-10-CM | POA: Diagnosis not present

## 2021-12-08 DIAGNOSIS — M6281 Muscle weakness (generalized): Secondary | ICD-10-CM | POA: Diagnosis not present

## 2021-12-08 DIAGNOSIS — R252 Cramp and spasm: Secondary | ICD-10-CM | POA: Diagnosis not present

## 2021-12-08 DIAGNOSIS — G8114 Spastic hemiplegia affecting left nondominant side: Secondary | ICD-10-CM | POA: Diagnosis not present

## 2021-12-13 DIAGNOSIS — R252 Cramp and spasm: Secondary | ICD-10-CM | POA: Diagnosis not present

## 2021-12-13 DIAGNOSIS — I69398 Other sequelae of cerebral infarction: Secondary | ICD-10-CM | POA: Diagnosis not present

## 2021-12-13 DIAGNOSIS — R262 Difficulty in walking, not elsewhere classified: Secondary | ICD-10-CM | POA: Diagnosis not present

## 2021-12-13 DIAGNOSIS — G8114 Spastic hemiplegia affecting left nondominant side: Secondary | ICD-10-CM | POA: Diagnosis not present

## 2021-12-13 DIAGNOSIS — M6281 Muscle weakness (generalized): Secondary | ICD-10-CM | POA: Diagnosis not present

## 2021-12-15 DIAGNOSIS — R252 Cramp and spasm: Secondary | ICD-10-CM | POA: Diagnosis not present

## 2021-12-15 DIAGNOSIS — R262 Difficulty in walking, not elsewhere classified: Secondary | ICD-10-CM | POA: Diagnosis not present

## 2021-12-15 DIAGNOSIS — G8114 Spastic hemiplegia affecting left nondominant side: Secondary | ICD-10-CM | POA: Diagnosis not present

## 2021-12-15 DIAGNOSIS — I69398 Other sequelae of cerebral infarction: Secondary | ICD-10-CM | POA: Diagnosis not present

## 2021-12-15 DIAGNOSIS — M6281 Muscle weakness (generalized): Secondary | ICD-10-CM | POA: Diagnosis not present

## 2021-12-17 DIAGNOSIS — R252 Cramp and spasm: Secondary | ICD-10-CM | POA: Diagnosis not present

## 2021-12-17 DIAGNOSIS — G8114 Spastic hemiplegia affecting left nondominant side: Secondary | ICD-10-CM | POA: Diagnosis not present

## 2021-12-17 DIAGNOSIS — M6281 Muscle weakness (generalized): Secondary | ICD-10-CM | POA: Diagnosis not present

## 2021-12-17 DIAGNOSIS — R262 Difficulty in walking, not elsewhere classified: Secondary | ICD-10-CM | POA: Diagnosis not present

## 2021-12-17 DIAGNOSIS — I69398 Other sequelae of cerebral infarction: Secondary | ICD-10-CM | POA: Diagnosis not present

## 2021-12-20 DIAGNOSIS — R262 Difficulty in walking, not elsewhere classified: Secondary | ICD-10-CM | POA: Diagnosis not present

## 2021-12-20 DIAGNOSIS — M6281 Muscle weakness (generalized): Secondary | ICD-10-CM | POA: Diagnosis not present

## 2021-12-20 DIAGNOSIS — I69398 Other sequelae of cerebral infarction: Secondary | ICD-10-CM | POA: Diagnosis not present

## 2021-12-20 DIAGNOSIS — G8114 Spastic hemiplegia affecting left nondominant side: Secondary | ICD-10-CM | POA: Diagnosis not present

## 2021-12-20 DIAGNOSIS — R252 Cramp and spasm: Secondary | ICD-10-CM | POA: Diagnosis not present

## 2021-12-22 DIAGNOSIS — I69398 Other sequelae of cerebral infarction: Secondary | ICD-10-CM | POA: Diagnosis not present

## 2021-12-22 DIAGNOSIS — R262 Difficulty in walking, not elsewhere classified: Secondary | ICD-10-CM | POA: Diagnosis not present

## 2021-12-22 DIAGNOSIS — M6281 Muscle weakness (generalized): Secondary | ICD-10-CM | POA: Diagnosis not present

## 2021-12-22 DIAGNOSIS — R252 Cramp and spasm: Secondary | ICD-10-CM | POA: Diagnosis not present

## 2021-12-22 DIAGNOSIS — G8114 Spastic hemiplegia affecting left nondominant side: Secondary | ICD-10-CM | POA: Diagnosis not present

## 2021-12-24 DIAGNOSIS — R252 Cramp and spasm: Secondary | ICD-10-CM | POA: Diagnosis not present

## 2021-12-24 DIAGNOSIS — I69398 Other sequelae of cerebral infarction: Secondary | ICD-10-CM | POA: Diagnosis not present

## 2021-12-24 DIAGNOSIS — R262 Difficulty in walking, not elsewhere classified: Secondary | ICD-10-CM | POA: Diagnosis not present

## 2021-12-24 DIAGNOSIS — G8114 Spastic hemiplegia affecting left nondominant side: Secondary | ICD-10-CM | POA: Diagnosis not present

## 2021-12-24 DIAGNOSIS — M6281 Muscle weakness (generalized): Secondary | ICD-10-CM | POA: Diagnosis not present

## 2021-12-29 ENCOUNTER — Other Ambulatory Visit: Payer: Self-pay | Admitting: Medical

## 2021-12-30 ENCOUNTER — Other Ambulatory Visit: Payer: Self-pay | Admitting: Physician Assistant

## 2022-01-04 DIAGNOSIS — I69398 Other sequelae of cerebral infarction: Secondary | ICD-10-CM | POA: Diagnosis not present

## 2022-01-04 DIAGNOSIS — G8114 Spastic hemiplegia affecting left nondominant side: Secondary | ICD-10-CM | POA: Diagnosis not present

## 2022-01-04 DIAGNOSIS — R262 Difficulty in walking, not elsewhere classified: Secondary | ICD-10-CM | POA: Diagnosis not present

## 2022-01-04 DIAGNOSIS — R252 Cramp and spasm: Secondary | ICD-10-CM | POA: Diagnosis not present

## 2022-01-04 DIAGNOSIS — M6281 Muscle weakness (generalized): Secondary | ICD-10-CM | POA: Diagnosis not present

## 2022-01-11 DIAGNOSIS — R252 Cramp and spasm: Secondary | ICD-10-CM | POA: Diagnosis not present

## 2022-01-11 DIAGNOSIS — R262 Difficulty in walking, not elsewhere classified: Secondary | ICD-10-CM | POA: Diagnosis not present

## 2022-01-11 DIAGNOSIS — M6281 Muscle weakness (generalized): Secondary | ICD-10-CM | POA: Diagnosis not present

## 2022-01-11 DIAGNOSIS — I69398 Other sequelae of cerebral infarction: Secondary | ICD-10-CM | POA: Diagnosis not present

## 2022-01-11 DIAGNOSIS — G8114 Spastic hemiplegia affecting left nondominant side: Secondary | ICD-10-CM | POA: Diagnosis not present

## 2022-01-13 DIAGNOSIS — M6281 Muscle weakness (generalized): Secondary | ICD-10-CM | POA: Diagnosis not present

## 2022-01-13 DIAGNOSIS — G8114 Spastic hemiplegia affecting left nondominant side: Secondary | ICD-10-CM | POA: Diagnosis not present

## 2022-01-13 DIAGNOSIS — R262 Difficulty in walking, not elsewhere classified: Secondary | ICD-10-CM | POA: Diagnosis not present

## 2022-01-13 DIAGNOSIS — I69398 Other sequelae of cerebral infarction: Secondary | ICD-10-CM | POA: Diagnosis not present

## 2022-01-13 DIAGNOSIS — R252 Cramp and spasm: Secondary | ICD-10-CM | POA: Diagnosis not present

## 2022-01-19 ENCOUNTER — Other Ambulatory Visit: Payer: Self-pay | Admitting: Medical

## 2022-01-19 DIAGNOSIS — G8110 Spastic hemiplegia affecting unspecified side: Secondary | ICD-10-CM

## 2022-01-19 DIAGNOSIS — Z449 Encounter for fitting and adjustment of unspecified external prosthetic device: Secondary | ICD-10-CM

## 2022-01-19 DIAGNOSIS — Z8673 Personal history of transient ischemic attack (TIA), and cerebral infarction without residual deficits: Secondary | ICD-10-CM

## 2022-01-19 DIAGNOSIS — L989 Disorder of the skin and subcutaneous tissue, unspecified: Secondary | ICD-10-CM

## 2022-01-19 MED ORDER — POTASSIUM CHLORIDE CRYS ER 20 MEQ PO TBCR: 20.0000 meq | EXTENDED_RELEASE_TABLET | Freq: Two times a day (BID) | ORAL | 0 refills | Status: DC

## 2022-01-20 DIAGNOSIS — I69398 Other sequelae of cerebral infarction: Secondary | ICD-10-CM | POA: Diagnosis not present

## 2022-01-20 DIAGNOSIS — R252 Cramp and spasm: Secondary | ICD-10-CM | POA: Diagnosis not present

## 2022-01-20 DIAGNOSIS — G8114 Spastic hemiplegia affecting left nondominant side: Secondary | ICD-10-CM | POA: Diagnosis not present

## 2022-01-20 DIAGNOSIS — R262 Difficulty in walking, not elsewhere classified: Secondary | ICD-10-CM | POA: Diagnosis not present

## 2022-01-20 DIAGNOSIS — M6281 Muscle weakness (generalized): Secondary | ICD-10-CM | POA: Diagnosis not present

## 2022-01-23 ENCOUNTER — Other Ambulatory Visit: Payer: Self-pay | Admitting: Medical

## 2022-01-25 DIAGNOSIS — G8114 Spastic hemiplegia affecting left nondominant side: Secondary | ICD-10-CM | POA: Diagnosis not present

## 2022-01-25 DIAGNOSIS — R262 Difficulty in walking, not elsewhere classified: Secondary | ICD-10-CM | POA: Diagnosis not present

## 2022-01-25 DIAGNOSIS — R252 Cramp and spasm: Secondary | ICD-10-CM | POA: Diagnosis not present

## 2022-01-25 DIAGNOSIS — M6281 Muscle weakness (generalized): Secondary | ICD-10-CM | POA: Diagnosis not present

## 2022-01-25 DIAGNOSIS — I69398 Other sequelae of cerebral infarction: Secondary | ICD-10-CM | POA: Diagnosis not present

## 2022-01-27 ENCOUNTER — Ambulatory Visit: Payer: Medicare Other | Admitting: Adult Health

## 2022-01-27 DIAGNOSIS — R252 Cramp and spasm: Secondary | ICD-10-CM | POA: Diagnosis not present

## 2022-01-27 DIAGNOSIS — R262 Difficulty in walking, not elsewhere classified: Secondary | ICD-10-CM | POA: Diagnosis not present

## 2022-01-27 DIAGNOSIS — I69398 Other sequelae of cerebral infarction: Secondary | ICD-10-CM | POA: Diagnosis not present

## 2022-01-27 DIAGNOSIS — G8114 Spastic hemiplegia affecting left nondominant side: Secondary | ICD-10-CM | POA: Diagnosis not present

## 2022-01-27 DIAGNOSIS — M6281 Muscle weakness (generalized): Secondary | ICD-10-CM | POA: Diagnosis not present

## 2022-01-28 ENCOUNTER — Other Ambulatory Visit: Payer: Self-pay | Admitting: Medical

## 2022-01-28 DIAGNOSIS — I10 Essential (primary) hypertension: Secondary | ICD-10-CM

## 2022-01-28 MED ORDER — DILTIAZEM HCL ER COATED BEADS 240 MG PO CP24: 240.0000 mg | ORAL_CAPSULE | Freq: Every day | ORAL | 1 refills | Status: DC

## 2022-02-01 DIAGNOSIS — R252 Cramp and spasm: Secondary | ICD-10-CM | POA: Diagnosis not present

## 2022-02-01 DIAGNOSIS — I69398 Other sequelae of cerebral infarction: Secondary | ICD-10-CM | POA: Diagnosis not present

## 2022-02-01 DIAGNOSIS — M6281 Muscle weakness (generalized): Secondary | ICD-10-CM | POA: Diagnosis not present

## 2022-02-01 DIAGNOSIS — G8114 Spastic hemiplegia affecting left nondominant side: Secondary | ICD-10-CM | POA: Diagnosis not present

## 2022-02-01 DIAGNOSIS — R262 Difficulty in walking, not elsewhere classified: Secondary | ICD-10-CM | POA: Diagnosis not present

## 2022-02-03 DIAGNOSIS — R252 Cramp and spasm: Secondary | ICD-10-CM | POA: Diagnosis not present

## 2022-02-03 DIAGNOSIS — I69398 Other sequelae of cerebral infarction: Secondary | ICD-10-CM | POA: Diagnosis not present

## 2022-02-03 DIAGNOSIS — M6281 Muscle weakness (generalized): Secondary | ICD-10-CM | POA: Diagnosis not present

## 2022-02-03 DIAGNOSIS — G8114 Spastic hemiplegia affecting left nondominant side: Secondary | ICD-10-CM | POA: Diagnosis not present

## 2022-02-03 DIAGNOSIS — R262 Difficulty in walking, not elsewhere classified: Secondary | ICD-10-CM | POA: Diagnosis not present

## 2022-02-08 DIAGNOSIS — M6281 Muscle weakness (generalized): Secondary | ICD-10-CM | POA: Diagnosis not present

## 2022-02-08 DIAGNOSIS — I69398 Other sequelae of cerebral infarction: Secondary | ICD-10-CM | POA: Diagnosis not present

## 2022-02-08 DIAGNOSIS — G8114 Spastic hemiplegia affecting left nondominant side: Secondary | ICD-10-CM | POA: Diagnosis not present

## 2022-02-08 DIAGNOSIS — R262 Difficulty in walking, not elsewhere classified: Secondary | ICD-10-CM | POA: Diagnosis not present

## 2022-02-08 DIAGNOSIS — R252 Cramp and spasm: Secondary | ICD-10-CM | POA: Diagnosis not present

## 2022-02-15 DIAGNOSIS — R252 Cramp and spasm: Secondary | ICD-10-CM | POA: Diagnosis not present

## 2022-02-15 DIAGNOSIS — M6281 Muscle weakness (generalized): Secondary | ICD-10-CM | POA: Diagnosis not present

## 2022-02-15 DIAGNOSIS — I69398 Other sequelae of cerebral infarction: Secondary | ICD-10-CM | POA: Diagnosis not present

## 2022-02-15 DIAGNOSIS — R262 Difficulty in walking, not elsewhere classified: Secondary | ICD-10-CM | POA: Diagnosis not present

## 2022-02-15 DIAGNOSIS — G8114 Spastic hemiplegia affecting left nondominant side: Secondary | ICD-10-CM | POA: Diagnosis not present

## 2022-02-16 DIAGNOSIS — I69398 Other sequelae of cerebral infarction: Secondary | ICD-10-CM | POA: Diagnosis not present

## 2022-02-16 DIAGNOSIS — R252 Cramp and spasm: Secondary | ICD-10-CM | POA: Diagnosis not present

## 2022-02-16 DIAGNOSIS — I639 Cerebral infarction, unspecified: Secondary | ICD-10-CM | POA: Diagnosis not present

## 2022-03-18 ENCOUNTER — Other Ambulatory Visit: Payer: Self-pay | Admitting: Medical

## 2022-04-05 ENCOUNTER — Other Ambulatory Visit: Payer: Self-pay | Admitting: Medical

## 2022-04-11 ENCOUNTER — Ambulatory Visit (INDEPENDENT_AMBULATORY_CARE_PROVIDER_SITE_OTHER): Payer: Medicare Other | Admitting: Medical

## 2022-04-11 VITALS — BP 128/78 | HR 69 | Wt 163.4 lb

## 2022-04-11 DIAGNOSIS — E785 Hyperlipidemia, unspecified: Secondary | ICD-10-CM

## 2022-04-11 DIAGNOSIS — I6930 Unspecified sequelae of cerebral infarction: Secondary | ICD-10-CM | POA: Diagnosis not present

## 2022-04-11 DIAGNOSIS — I10 Essential (primary) hypertension: Secondary | ICD-10-CM | POA: Diagnosis not present

## 2022-04-11 DIAGNOSIS — R7301 Impaired fasting glucose: Secondary | ICD-10-CM | POA: Diagnosis not present

## 2022-04-11 DIAGNOSIS — Z993 Dependence on wheelchair: Secondary | ICD-10-CM

## 2022-04-11 DIAGNOSIS — L989 Disorder of the skin and subcutaneous tissue, unspecified: Secondary | ICD-10-CM

## 2022-04-11 DIAGNOSIS — Z7185 Encounter for immunization safety counseling: Secondary | ICD-10-CM | POA: Diagnosis not present

## 2022-04-11 DIAGNOSIS — G40909 Epilepsy, unspecified, not intractable, without status epilepticus: Secondary | ICD-10-CM

## 2022-04-11 DIAGNOSIS — R32 Unspecified urinary incontinence: Secondary | ICD-10-CM | POA: Diagnosis not present

## 2022-04-11 DIAGNOSIS — R451 Restlessness and agitation: Secondary | ICD-10-CM

## 2022-04-11 DIAGNOSIS — H9193 Unspecified hearing loss, bilateral: Secondary | ICD-10-CM

## 2022-04-11 DIAGNOSIS — Z125 Encounter for screening for malignant neoplasm of prostate: Secondary | ICD-10-CM

## 2022-04-11 DIAGNOSIS — E876 Hypokalemia: Secondary | ICD-10-CM

## 2022-04-11 NOTE — Progress Notes (Signed)
Subjective:  Steve Burton is a 67 y.o. male who presents for Chief Complaint  Patient presents with   med check    Med check Needs GTA access form filled out      Current Health Care Team: Dentist, Dr.  Luciano Cutter, NP neurology Dr. Louann Sjogren, podiatry Dr. Olga Millers, cardiology, but hasn't seen in a while, a few years Dr. Janalyn Harder, dermatology Dr. Rob Bunting, GI  Here for med check.  Accompanied by his wife Steve Burton.  Patient relies on his wife for assistance getting around, help with his home physical therapy, general help with ADLs although he can still do some of this himself.  He still plays piano typically most days per week.  He can walk limited with a cane but primarily uses a cane or wheelchair.  He walks around the house daily.  Lately doing well without particular complaints.  Mood has been good, less agitation late.  He has a history of stroke, history of left hemiparesis, spasticity of muscles, hypertension, hyperlipidemia, impaired glucose.  Hyperlipidemia-compliant with aspirin 81 mg daily and Lipitor 10 mg daily  Hypertension-compliant with spironolactone 25 mg daily, losartan 100 mg daily, hydralazine 50 mg 3 times a day, diltiazem 240 mg CD daily  Spasticity of muscle, wheelchair-bound-uses baclofen up to 3 times a day. Wife helps him with his splints and PT exercises at home.  If misses dose of baclofen, she seems more spasms of hands and muscles.  Uses splints left arm, 1 for sleep, 1 for day time uses.   Uses brace for left leg for walking.  Uses quad cane.    History of seizure disorder. He remains seizure free.   He is compliant with Keppra 500 mg twice daily  Compliant with potassium given his blood pressure medicine is a history of low potassium  He recently [facility] and had a good experience with this.  No other aggravating or relieving factors.    No other c/o.   Past  Medical History:  Diagnosis Date   Elevated LDL cholesterol level    Elevated PSA measurement 11/22/2016   Hyperlipidemia    Hypertension    Impaired fasting glucose 11/22/2016   Left hemiparesis (HCC)    Spastic hemiparesis (HCC)    Stroke [facility]) 2011   Current Outpatient Medications on File Prior to Visit  Medication Sig Dispense Refill   acetaminophen (TYLENOL) 500 MG tablet Take 500 mg by mouth every 6 (six) hours as needed for mild pain.     aspirin EC 81 MG tablet Take 1 tablet (81 mg total) by mouth daily. Swallow whole. 30 tablet 0   atorvastatin (LIPITOR) 10 MG tablet TAKE 1 TABLET BY MOUTH DAILY 90 tablet 3   baclofen (LIORESAL) 10 MG tablet TAKE 1 TABLET BY MOUTH 3 TIMES  DAILY 270 tablet 0   diltiazem (CARDIZEM CD) 240 MG 24 hr capsule Take 1 capsule (240 mg total) by mouth daily. 100 capsule 1   hydrALAZINE (APRESOLINE) 50 MG tablet TAKE 1 TABLET BY MOUTH 3 TIMES  DAILY 300 tablet 2   labetalol (NORMODYNE) 100 MG tablet TAKE 1 TABLET BY MOUTH TWICE  DAILY 200 tablet 3   levETIRAcetam (KEPPRA) 500 MG tablet TAKE 1 TABLET BY MOUTH TWICE  DAILY 200 tablet 2   losartan (COZAAR) 100 MG tablet TAKE 1 TABLET BY MOUTH DAILY 100 tablet 3   potassium chloride SA (KLOR-CON M) 20 MEQ tablet TAKE 1 TABLET BY  MOUTH TWICE  DAILY 200 tablet 2   spironolactone (ALDACTONE) 25 MG tablet Take 1 tablet (25 mg total) by mouth daily. 30 tablet 1   No current facility-administered medications on file prior to visit.     The following portions of the patient's history were reviewed and updated as appropriate: allergies, current medications, past family history, past medical history, past social history, past surgical history and problem list.  ROS Otherwise as in subjective above    Objective: BP 128/78   Pulse 69   Wt 163 lb 6.4 oz (74.1 kg)   BMI 22.16 kg/m   Wt Readings from Last 3 Encounters:  04/11/22 163 lb 6.4 oz (74.1 kg)  11/22/21 165 lb 6.4 oz (75 kg)  11/11/21 160 lb (72.6  kg)   BP Readings from Last 3 Encounters:  04/11/22 128/78  11/22/21 (!) 100/54  11/17/21 130/84   General appearance: alert, no distress, well developed, well nourished Neck: supple, no lymphadenopathy, no thyromegaly, no masses Heart: RRR, normal S1, S2, no murmurs Lungs: CTA bilaterally, no wheezes, rhonchi, or rales Pulses: 2+ radial pulses, 2+ pedal pulses, normal cap refill Ext: no edema Seated in wheelchair Somewhat hard of hearing    Assessment: Encounter Diagnoses  Name Primary?   Hyperlipidemia, unspecified hyperlipidemia type Yes   Wheelchair dependence    Urinary incontinence, unspecified type    Hypokalemia    History of hemorrhagic cerebrovascular accident (CVA) with residual deficit    Decreased hearing, bilateral    Agitation    Seizure disorder    Impaired fasting glucose    Essential hypertension    Screening for prostate cancer    Vaccine counseling      Plan: Encounter Diagnoses  Name Primary?   Hyperlipidemia, unspecified hyperlipidemia type Yes   Wheelchair dependence    Urinary incontinence, unspecified type    Hypokalemia    History of hemorrhagic cerebrovascular accident (CVA) with residual deficit    Decreased hearing, bilateral    Agitation    Seizure disorder    Impaired fasting glucose    Essential hypertension    Screening for prostate cancer    Vaccine counseling    Hyperlipidemia-continue statin and aspirin, last labs reviewed from fall 2023 at goal  Hypertension-compliant with medications, BP at goal  History of stroke, spasticity of muscle-uses splints on the left arm and left leg, uses cane and/or wheelchair, uses baclofen 3 times a day to help with spasticity, his wife continues to help him with his daily physical therapy exercises.  He recently [facility]  History of skin lesion-referral back to dermatology since the prior dermatology office closed  History of hypokalemia-last  labs in the fall 2023 at goal, continue potassium  Decreased hearing-he declines referral to audiology  History of seizure-I reviewed his neurology notes from 2023.  They discussed possibly coming off Keppra.  He goes back to see them soon and is remained another year seizure-free.  Agitation-no recent issues with mood or agitation as he had a few months ago  Echocardiogram from 11/2021 with normal LV function, EF 60-65%, mild mitral and aortic valve regurge  I completed his forms for [employer] transportation  Impaired glucose-updated labs today but has been stable  PSA screening today  Of note last colonoscopy 2016.  He will be due in 2 years   Christopherjame was seen today for med check.  Diagnoses and all orders for this visit:  Hyperlipidemia, unspecified hyperlipidemia type  Wheelchair dependence  Urinary incontinence, unspecified type  Hypokalemia  History of hemorrhagic cerebrovascular accident (CVA) with residual deficit  Decreased hearing, bilateral  Agitation  Seizure disorder  Impaired fasting glucose -     Hemoglobin A1c  Essential hypertension  Screening for prostate cancer -     PSA, total and free  Vaccine counseling  Follow up: pending labs, 59mo well visit

## 2022-04-11 NOTE — Patient Instructions (Signed)
Get your shingles vaccine at your local pharmacy.  Have them send [place] a copy of the documentation.   Let me know if you want referral to audiology for hearing loss  We will update some labs for diabetes screen and prostate lab today  Follow up with neurology soon as planned

## 2022-04-12 LAB — HEMOGLOBIN A1C
Est. average glucose Bld gHb Est-mCnc: 111 mg/dL
Hgb A1c MFr Bld: 5.5 % (ref 4.8–5.6)

## 2022-04-12 LAB — PSA, TOTAL AND FREE
PSA, Free Pct: 19 %
PSA, Free: 0.74 ng/mL
Prostate Specific Ag, Serum: 3.9 ng/mL (ref 0.0–4.0)

## 2022-04-12 NOTE — Progress Notes (Signed)
Results sent through MyChart

## 2022-04-15 NOTE — Addendum Note (Signed)
Addended by: Herminio Commons A on: 04/15/2022 08:45 AM   Modules accepted: Orders

## 2022-04-16 ENCOUNTER — Other Ambulatory Visit: Payer: Self-pay | Admitting: Cardiology

## 2022-04-20 ENCOUNTER — Other Ambulatory Visit: Payer: Self-pay | Admitting: Adult Health

## 2022-04-20 ENCOUNTER — Other Ambulatory Visit: Payer: Self-pay | Admitting: Medical

## 2022-04-20 ENCOUNTER — Other Ambulatory Visit: Payer: Self-pay | Admitting: Cardiology

## 2022-04-27 ENCOUNTER — Other Ambulatory Visit: Payer: Self-pay

## 2022-04-27 DIAGNOSIS — R252 Cramp and spasm: Secondary | ICD-10-CM

## 2022-04-27 DIAGNOSIS — Z8673 Personal history of transient ischemic attack (TIA), and cerebral infarction without residual deficits: Secondary | ICD-10-CM

## 2022-04-27 DIAGNOSIS — R29898 Other symptoms and signs involving the musculoskeletal system: Secondary | ICD-10-CM

## 2022-04-28 ENCOUNTER — Other Ambulatory Visit: Payer: Self-pay

## 2022-04-28 DIAGNOSIS — Z8673 Personal history of transient ischemic attack (TIA), and cerebral infarction without residual deficits: Secondary | ICD-10-CM

## 2022-04-28 DIAGNOSIS — R29898 Other symptoms and signs involving the musculoskeletal system: Secondary | ICD-10-CM

## 2022-04-28 DIAGNOSIS — R252 Cramp and spasm: Secondary | ICD-10-CM

## 2022-04-28 NOTE — Telephone Encounter (Signed)
Referral sent 

## 2022-05-03 DIAGNOSIS — R262 Difficulty in walking, not elsewhere classified: Secondary | ICD-10-CM | POA: Diagnosis not present

## 2022-05-03 DIAGNOSIS — M6281 Muscle weakness (generalized): Secondary | ICD-10-CM | POA: Diagnosis not present

## 2022-05-03 DIAGNOSIS — R252 Cramp and spasm: Secondary | ICD-10-CM | POA: Diagnosis not present

## 2022-05-03 DIAGNOSIS — G8112 Spastic hemiplegia affecting left dominant side: Secondary | ICD-10-CM | POA: Diagnosis not present

## 2022-05-10 ENCOUNTER — Other Ambulatory Visit: Payer: Self-pay | Admitting: Cardiology

## 2022-05-10 DIAGNOSIS — G8112 Spastic hemiplegia affecting left dominant side: Secondary | ICD-10-CM | POA: Diagnosis not present

## 2022-05-10 DIAGNOSIS — R252 Cramp and spasm: Secondary | ICD-10-CM | POA: Diagnosis not present

## 2022-05-10 DIAGNOSIS — M6281 Muscle weakness (generalized): Secondary | ICD-10-CM | POA: Diagnosis not present

## 2022-05-10 DIAGNOSIS — R262 Difficulty in walking, not elsewhere classified: Secondary | ICD-10-CM | POA: Diagnosis not present

## 2022-05-24 DIAGNOSIS — R262 Difficulty in walking, not elsewhere classified: Secondary | ICD-10-CM | POA: Diagnosis not present

## 2022-05-24 DIAGNOSIS — M6281 Muscle weakness (generalized): Secondary | ICD-10-CM | POA: Diagnosis not present

## 2022-05-24 DIAGNOSIS — R252 Cramp and spasm: Secondary | ICD-10-CM | POA: Diagnosis not present

## 2022-05-24 DIAGNOSIS — G8112 Spastic hemiplegia affecting left dominant side: Secondary | ICD-10-CM | POA: Diagnosis not present

## 2022-06-06 ENCOUNTER — Other Ambulatory Visit: Payer: Self-pay | Admitting: Cardiology

## 2022-06-10 ENCOUNTER — Other Ambulatory Visit: Payer: Self-pay | Admitting: Neurology

## 2022-06-10 ENCOUNTER — Other Ambulatory Visit: Payer: Self-pay | Admitting: Medical

## 2022-07-07 NOTE — Progress Notes (Signed)
Cardiology Clinic Note   Patient Name: Steve Burton Date of Encounter: 07/13/2022  Primary Care Provider:  Jac Canavan, PA-C Primary Cardiologist:  Olga Millers, MD  Patient Profile    67 year old male with history of possible HOCM, Cardiac MRI December 2016 showed normal LV function, focal basal septal hypertrophy, no SAM and hypertrophic cardiomyopathy could not be excluded but findings also could be consistent with hypertension.   Last echo March 2021 showed normal LV function, moderate left ventricular hypertrophy with severe basal septal hypertrophy, grade 1 diastolic dysfunction, no LVOT gradient at rest, trace aortic insufficiency.  Other hx of HL, HTN, CVA.   Past Medical History    Past Medical History:  Diagnosis Date   Elevated LDL cholesterol level    Elevated PSA measurement 11/22/2016   Hyperlipidemia    Hypertension    Impaired fasting glucose 11/22/2016   Left hemiparesis (HCC)    Spastic hemiparesis (HCC)    Stroke (HCC) 2011   Past Surgical History:  Procedure Laterality Date   CATARACT EXTRACTION  01/04/2004   right eye   HERNIA REPAIR Right 01/04/2003   inguinal    Allergies  Allergies  Allergen Reactions   Phenytoin Sodium Extended Swelling and Other (See Comments)    Dilantin- Arm swelling    Penicillins Rash    Has patient had a PCN reaction causing immediate rash, facial/tongue/throat swelling, SOB or lightheadedness with hypotension: Unknown Has patient had a PCN reaction causing severe rash involving mucus membranes or skin necrosis: Unk Has patient had a PCN reaction that required hospitalization: Unk Has patient had a PCN reaction occurring within the last 10 years: Yes If all of the above answers are "NO", then may proceed with Cephalosporin use.     History of Present Illness    Mr. Falcon is a very pleasant gentleman who comes today with his wife for 1 year follow-up and refills on medications.  He is wheelchair-bound  secondary to CVA.  He offers no cardiac complaints at this time chest pain, dyspnea, PND or orthopnea, no myalgia pain on atorvastatin.  Continues to be followed by neurology.  Home Medications    Current Outpatient Medications  Medication Sig Dispense Refill   acetaminophen (TYLENOL) 500 MG tablet Take 500 mg by mouth every 6 (six) hours as needed for mild pain.     aspirin EC 81 MG tablet Take 1 tablet (81 mg total) by mouth daily. Swallow whole. 30 tablet 0   atorvastatin (LIPITOR) 10 MG tablet TAKE 1 TABLET BY MOUTH DAILY 100 tablet 2   baclofen (LIORESAL) 10 MG tablet TAKE 1 TABLET BY MOUTH 3 TIMES  DAILY 270 tablet 1   diltiazem (CARDIZEM CD) 240 MG 24 hr capsule Take 1 capsule (240 mg total) by mouth daily. 100 capsule 1   hydrALAZINE (APRESOLINE) 50 MG tablet TAKE 1 TABLET BY MOUTH 3 TIMES  DAILY 300 tablet 2   labetalol (NORMODYNE) 100 MG tablet TAKE 1 TABLET BY MOUTH TWICE  DAILY 200 tablet 3   levETIRAcetam (KEPPRA) 500 MG tablet Take 1 tablet (500 mg total) by mouth 2 (two) times daily. 180 tablet 3   losartan (COZAAR) 100 MG tablet TAKE 1 TABLET BY MOUTH DAILY 100 tablet 3   potassium chloride SA (KLOR-CON M) 20 MEQ tablet TAKE 1 TABLET BY MOUTH TWICE  DAILY 200 tablet 2   spironolactone (ALDACTONE) 25 MG tablet Take 1 tablet (25 mg total) by mouth daily. 90 tablet 3   No current  facility-administered medications for this visit.     Family History    Family History  Problem Relation Age of Onset   Stroke Mother    Heart disease Father        MI at age 81   Epilepsy Sister    Colon cancer Neg Hx    He indicated that his mother is deceased. He indicated that his father is deceased. He indicated that his sister is alive. He indicated that his brother is alive. He indicated that his maternal grandmother is deceased. He indicated that his maternal grandfather is deceased. He indicated that his paternal grandmother is deceased. He indicated that his paternal grandfather is  deceased. He indicated that the status of his neg hx is unknown.  Social History    Social History   Socioeconomic History   Marital status: Married    Spouse name: Not on file   Number of children: Not on file   Years of education: Not on file   Highest education level: Not on file  Occupational History   Not on file  Tobacco Use   Smoking status: Never   Smokeless tobacco: Never  Substance and Sexual Activity   Alcohol use: Yes    Alcohol/week: 0.0 standard drinks of alcohol    Comment: 1 glass of wine per week.    Drug use: No   Sexual activity: Not on file  Other Topics Concern   Not on file  Social History Narrative   Married, walking for exercise some. Uses cane and wheelchair.  Hobbies -  piano.  09/2021   Social Determinants of Health   Financial Resource Strain: Not on file  Food Insecurity: Not on file  Transportation Needs: Not on file  Physical Activity: Not on file  Stress: Not on file  Social Connections: Not on file  Intimate Partner Violence: Not on file     Review of Systems    General:  No chills, fever, night sweats or weight changes.  Cardiovascular:  No chest pain, dyspnea on exertion, edema, orthopnea, palpitations, paroxysmal nocturnal dyspnea. Dermatological: No rash, lesions/masses Respiratory: No cough, dyspnea Urologic: No hematuria, dysuria Abdominal:   No nausea, vomiting, diarrhea, bright red blood per rectum, melena, or hematemesis Neurologic:  No visual changes, wkns, changes in mental status. All other systems reviewed and are otherwise negative except as noted above.  EKG Interpretation Date/Time:  Wednesday July 13 2022 07:51:03 EDT Ventricular Rate:  64 PR Interval:  160 QRS Duration:  80 QT Interval:  412 QTC Calculation: 425 R Axis:   7  Text Interpretation: Sinus rhythm with Premature atrial complexes in a pattern of bigeminy When compared with ECG of 11-Nov-2021 23:23, PREVIOUS ECG IS PRESENT Confirmed by Joni Reining [phone]) on 07/13/2022 8:04:03 AM    Physical Exam    VS:  BP 122/74 (BP Location: Right Arm, Patient Position: Sitting, Cuff Size: Normal)   Pulse 64   Ht 5\' 9"  (1.753 m)   Wt 166 lb (75.3 kg)   SpO2 97%   BMI 24.51 kg/m  , BMI Body mass index is 24.51 kg/m.     GEN: Well nourished, well developed, in no acute distress. HEENT: normal. Neck: Supple, no JVD, left carotid bruit, none on the right, no masses. Cardiac: RRR, 2/6 holosystolic murmur, heard best at the right sternal border with radiation into the left carotid no rubs, or gallops. No clubbing, cyanosis, edema.  Radials/DP/PT 2+ and equal bilaterally.  Respiratory:  Respirations regular and  unlabored, clear to auscultation bilaterally. GI: Soft, nontender, nondistended, BS + x 4. MS: no deformity or atrophy. Skin: warm and dry, no rash. Neuro:  Strength and sensation are intact.  Left-sided weakness. Psych: Normal affect.  EKG Interpretation Date/Time:  Wednesday July 13 2022 07:51:03 EDT Ventricular Rate:  64 PR Interval:  160 QRS Duration:  80 QT Interval:  412 QTC Calculation: 425 R Axis:   7  Text Interpretation: Sinus rhythm with Premature atrial complexes in a pattern of bigeminy When compared with ECG of 11-Nov-2021 23:23, PREVIOUS ECG IS PRESENT Confirmed by Joni Reining [phone]) on 07/13/2022 8:04:03 AM   Lab Results  Component Value Date   WBC 7.2 11/17/2021   HGB 12.1 (L) 11/17/2021   HCT 36.1 (L) 11/17/2021   MCV 85.3 11/17/2021   PLT 89 (L) 11/17/2021   Lab Results  Component Value Date   CREATININE 1.10 11/22/2021   BUN 20 11/22/2021   NA 142 11/22/2021   K 4.6 11/22/2021   CL 107 (H) 11/22/2021   CO2 21 11/22/2021   Lab Results  Component Value Date   ALT 46 (H) 11/17/2021   AST 34 11/17/2021   ALKPHOS 58 11/17/2021   BILITOT 0.3 11/17/2021   Lab Results  Component Value Date   CHOL 173 09/27/2021   HDL 55 09/27/2021   LDLCALC 103 (H) 09/27/2021   TRIG 81 09/27/2021    CHOLHDL 3.1 09/27/2021    Lab Results  Component Value Date   HGBA1C 5.5 04/11/2022     Review of Prior Studies EKG Interpretation Date/Time:  Wednesday July 13 2022 07:51:03 EDT Ventricular Rate:  64 PR Interval:  160 QRS Duration:  80 QT Interval:  412 QTC Calculation: 425 R Axis:   7  Text Interpretation: Sinus rhythm with Premature atrial complexes in a pattern of bigeminy When compared with ECG of 11-Nov-2021 23:23, PREVIOUS ECG IS PRESENT Confirmed by Joni Reining [phone]) on 07/13/2022 8:04:03 AM  Echocardiogram 11/13/2021 1. Left ventricular ejection fraction, by estimation, is 60 to 65%. Left  ventricular ejection fraction by 2D MOD biplane is 63.3 %. The left  ventricle has normal function. The left ventricle has no regional wall  motion abnormalities. Left ventricular  diastolic parameters are consistent with Grade I diastolic dysfunction  (impaired relaxation).   2. Right ventricular systolic function is normal. The right ventricular  size is normal. There is normal pulmonary artery systolic pressure. The  estimated right ventricular systolic pressure is 24.3 mmHg.   3. The mitral valve is abnormal. Trivial mitral valve regurgitation.   4. The aortic valve is tricuspid. Aortic valve regurgitation is mild.  Aortic valve sclerosis/calcification is present, without any evidence of  aortic stenosis. Aortic regurgitation PHT measures 568 msec.   5. The inferior vena cava is normal in size with greater than 50%  respiratory variability, suggesting right atrial pressure of 3 mmHg.   Comparison(s): Changes from prior study are noted. 03/08/2019: LVEF 60-65%,  trivial AI.    Assessment & Plan   1.  Hypertension: Excellent control of blood pressure currently.  He is given refills on spironolactone 25 mg daily, due to delay in mail refill, I have ordered 20 tablets to local [facility] pharmacy, and then refilled his long-term 90-day supply through [facility].   Losartan,labetalol and diltiazem will be eligible for refills when needed.  I am ordering kidney function labs with a CMET in conjunction with lipid studies.  2.  Hypercholesterolemia: Most recent labs were completed in  August 2023.  He is fasting.  Lipid profile is ordered today.  3.  Aortic valve murmur: Most recent echo revealed possible hypertrophic cardiomyopathy, no LVOT obstruction.  Repeat echo should be completed next year on follow-up appointment.   4.  History of CVA: Wheelchair-bound with left-sided weakness.  Continue with neurology.  Signed, Bettey Mare. Liborio Nixon, ANP, AACC   07/13/2022 8:10 AM      Office [phone] Fax [phone]  Notice: This dictation was prepared with Dragon dictation along with smaller phrase technology. Any transcriptional errors that result from this process are unintentional and may not be corrected upon review.

## 2022-07-11 ENCOUNTER — Ambulatory Visit: Payer: Medicare Other | Admitting: Adult Health

## 2022-07-11 ENCOUNTER — Encounter: Payer: Self-pay | Admitting: Adult Health

## 2022-07-11 VITALS — BP 130/71 | HR 62 | Ht 68.0 in

## 2022-07-11 DIAGNOSIS — I610 Nontraumatic intracerebral hemorrhage in hemisphere, subcortical: Secondary | ICD-10-CM

## 2022-07-11 DIAGNOSIS — G40219 Localization-related (focal) (partial) symptomatic epilepsy and epileptic syndromes with complex partial seizures, intractable, without status epilepticus: Secondary | ICD-10-CM

## 2022-07-11 DIAGNOSIS — I639 Cerebral infarction, unspecified: Secondary | ICD-10-CM

## 2022-07-11 MED ORDER — LEVETIRACETAM 500 MG PO TABS: 500.0000 mg | ORAL_TABLET | Freq: Two times a day (BID) | ORAL | 3 refills | Status: DC

## 2022-07-11 NOTE — Patient Instructions (Addendum)
Continue current treatment plan  Please call with any seizure activity or medication concerns  Would recommend looking into driving rehabs to discuss adaptive equipment for your vehicle - additional information below  Continue to follow with your primary doctor for aggressive stroke risk factor management    Follow up in 1 year or call earlier if needed

## 2022-07-11 NOTE — Progress Notes (Signed)
[facility] [address] [place]. [place] [address] [phone]       OFFICE FOLLOW UP NOTE  [name] Date of Birth:  January 11, 1955 Medical Record Number:  [id number]   [facility] provider: Dr. Pearlean Burton Referring MD: Steve Burton Reason for Referral: Seizure, hx of stroke     CC: Seizure Chief Complaint  Patient presents with   Follow-up    Rm 3, here with wife Steve Burton Here for 1 year seizure follow-up. Overall stable without seizure activity. States he had a small stroke in 11/2021, left side hand/arm is weak and uses a walker at home.  Wife states he has been doing PT once a month.      HPI:   Update 07/11/2022 JM: Returns for yearly seizure follow-up.  Denies any seizure activity.  Reports compliance on Keppra 500 mg twice daily, tolerating well.  Repeat EEG 05/2021 normal.  He was hospitalized back in November for COVID-19 infection with acute hypoxic respiratory failure and acute metabolic encephalopathy and was found to have acute infarct in left frontal white matter which was felt due to small vessel disease in setting of COVID. Due to prior hx of ICH, recommended continuation of aspirin and statin for secondary prevention. He did have some generalized weakness after d/c but this has greatly improved after working with PT and now currently at baseline with chronic left spastic hemiparesis from prior stroke.  Denies any residual deficits from recent stroke.  Denies any new or worsening stroke/TIA symptoms since that time.  Currently on baclofen for spasticity managed by PCP.  Continues on aspirin and atorvastatin.  Blood pressure well-controlled.  Routinely follows with PCP for stroke risk factor management.  He does report occasionally driving short distance (<6.9 miles) always accompanied by his wife only during the day when light traffic times, avoids driving at night and bad weather. He has not had any difficulty with this nor does wife have any concerns with  this.     History provided for reference purposes only Update 01/27/2021 JM: Returns for 1 year seizure follow-up.  Overall stable without seizure activity.  Compliant on Keppra 500 mg twice daily without side effects. He does question possibly coming off Keppra as seizure free for almost 2 years.  History of prior stroke with residual left spastic hemiparesis stable, denies new stroke/TIA symptoms.  Routinely follows with PCP.  No new concerns at this time.  Update 01/28/2020 JM: Steve Burton returns for 55-month seizure follow-up accompanied by his wife.  Doing well since prior visit without any additional seizures and remains on Keppra 500 mg twice daily without side effects.  Post stroke left spastic hemiparesis stable without worsening and denies new stroke/TIA symptoms.  Continues to ambulate with a cane and denies any recent falls.  He exercises routinely and goes to the gym frequently.  No concerns at this time.  Update 07/25/2019 JM: Steve Burton returns for seizure follow-up accompanied by his wife Steve Burton.  He has been stable since prior visit without recurrent seizure activity or episodes.  Remains on Keppra 500 mg twice daily tolerating well.  Repeat EEG 05/20/2019 showed focal right hemispheric slowing likely related to sequela of prior stroke without definite seizure activity noted.  Remote history of right BG ICH in 2011 with residual spastic left hemiparesis which has been stable. Denies new stroke/TIA symptoms.  Blood pressure today 127/76.  No concerns at this time.  Initial visit 04/24/2019 Dr. Pearlean Burton: Steve Burton is a 67 year old Caucasian male seen today  for initial office consultation visit for seizure.  He is accompanied by his wife.  History is obtained from them, review of ER electronic medical records and I personally reviewed available imaging films in PACS.  He has a past medical history of hypertension and hypertensive right basal ganglia hemorrhage and March 2011 with significant  residual spastic left hemiplegia.  He had a witnessed episode of seizure on 04/20/2019.  Patient was sitting on a computer listening to music concert when his wife was in the next room heard a loud sound and when she arrived she found him lying on the floor leaning to 1 side and having jerking movements.  His eyes were partially open but he had a glazed look and was unresponsive.  He had a tongue bite and he also had urinary incontinence.  She called EMS patient remained unresponsive while they arrived and did not regain consciousness till he reached the hospital.  He subsequently gradually returned back to his baseline. MRI scan of the brain was obtained on 04/20/2019 which showed no acute abnormality showed large area of encephalomalacia in the right basal ganglia extending up into the corona radiata with gliosis.  MRI of the brain showed no large vessel stenosis or occlusion.  Urine analysis was negative.  Basic metabolic panel labs unremarkable.  CBC and creatinine kinase were also normal.  Urine drug screen was negative.  Patient was given IV Keppra and discharged home with Keppra 500 mg twice daily.  Patient states he is taken it for last 3 days he had a episode of emotional lability and cried yesterday but feels fine now.  He denies dizziness or other side effects.  I do not have access to the patient's previous admission in 2011 for intracerebral hemorrhage with apparently I saw the patient at that time.  The wife feels that he may have had a seizure at that time but was not discharged on any anticonvulsants.  He has no other history of hyper of head injury with loss of consciousness or seizures.  His blood pressure has apparently been well controlled and he takes labetalol , Cozaar and Aldactone.  Today blood pressure is acceptable at 136/79 .  Patient denies any office triggers for his seizures in the form of fever, infection, dehydration or sleep deprivation or alcohol use.  He has significant residual  spastic left hemiplegia from his address of the hemorrhage in 2011 and is able to walk with a hemiwalker but does not have much use of his left arm.  He has a left foot drop.  His wife still keeps a close watch on him but is able to do most activities for himself.  ROS:   14 system review of systems is positive for those listed in HPI and all other systems negative  PMH:  Past Medical History:  Diagnosis Date   Elevated LDL cholesterol level    Elevated PSA measurement 11/22/2016   Hyperlipidemia    Hypertension    Impaired fasting glucose 11/22/2016   Left hemiparesis (HCC)    Spastic hemiparesis (HCC)    Stroke [facility]) 2011    Social History:  Social History   Socioeconomic History   Marital status: Married    Spouse name: Not on file   Number of children: Not on file   Years of education: Not on file   Highest education level: Not on file  Occupational History   Not on file  Tobacco Use   Smoking status: Never   Smokeless tobacco:  Never  Substance and Sexual Activity   Alcohol use: Yes    Alcohol/week: 0.0 standard drinks of alcohol    Comment: 1 glass of wine per week.    Drug use: No   Sexual activity: Not on file  Other Topics Concern   Not on file  Social History Narrative   Married, walking for exercise some. Uses cane and wheelchair.  Hobbies -  piano.  09/2021   Social Determinants of Health   Financial Resource Strain: Not on file  Food Insecurity: Not on file  Transportation Needs: Not on file  Physical Activity: Not on file  Stress: Not on file  Social Connections: Not on file  Intimate Partner Violence: Not on file    Medications:   Current Outpatient Medications on File Prior to Visit  Medication Sig Dispense Refill   acetaminophen (TYLENOL) 500 MG tablet Take 500 mg by mouth every 6 (six) hours as needed for mild pain.     aspirin EC 81 MG tablet Take 1 tablet (81 mg total) by mouth daily. Swallow whole. 30 tablet 0   atorvastatin (LIPITOR) 10  MG tablet TAKE 1 TABLET BY MOUTH DAILY 100 tablet 2   baclofen (LIORESAL) 10 MG tablet TAKE 1 TABLET BY MOUTH 3 TIMES  DAILY 270 tablet 1   diltiazem (CARDIZEM CD) 240 MG 24 hr capsule Take 1 capsule (240 mg total) by mouth daily. 100 capsule 1   hydrALAZINE (APRESOLINE) 50 MG tablet TAKE 1 TABLET BY MOUTH 3 TIMES  DAILY 300 tablet 2   labetalol (NORMODYNE) 100 MG tablet TAKE 1 TABLET BY MOUTH TWICE  DAILY 200 tablet 3   losartan (COZAAR) 100 MG tablet TAKE 1 TABLET BY MOUTH DAILY 100 tablet 3   potassium chloride SA (KLOR-CON M) 20 MEQ tablet TAKE 1 TABLET BY MOUTH TWICE  DAILY 200 tablet 2   spironolactone (ALDACTONE) 25 MG tablet TAKE 1 TABLET BY MOUTH DAILY 15 tablet 0   No current facility-administered medications on file prior to visit.    Allergies:   Allergies  Allergen Reactions   Phenytoin Sodium Extended Swelling and Other (See Comments)    Dilantin- Arm swelling    Penicillins Rash    Has patient had a PCN reaction causing immediate rash, facial/tongue/throat swelling, SOB or lightheadedness with hypotension: Unknown Has patient had a PCN reaction causing severe rash involving mucus membranes or skin necrosis: Unk Has patient had a PCN reaction that required hospitalization: Unk Has patient had a PCN reaction occurring within the last 10 years: Yes If all of the above answers are "NO", then may proceed with Cephalosporin use.      Today's Vitals   07/11/22 1522  BP: 130/71  Pulse: 62  Height: 5\' 8"  (1.727 m)    Body mass index is 24.84 kg/m.   Physical Exam General: well developed, well nourished very pleasant middle-aged Caucasian male, seated, in no evident distress Head: head normocephalic and atraumatic.   Neck: supple with no carotid or supraclavicular bruits Cardiovascular: regular rate and rhythm, no murmurs Skin:  no rash/petichiae Vascular:  Normal pulses all extremities  Neurologic Exam Mental Status: Awake and fully alert.  Unable to appreciate  aphasia or dysarthria but does have paucity of speech.  Oriented to place and time. Recent and remote memory intact. Attention span, concentration and fund of knowledge appropriate. Mood and affect appropriate.  Cranial Nerves: Pupils equal, briskly reactive to light. Extraocular movements full without nystagmus. Visual fields full to confrontation. Hearing intact.  Facial sensation intact.  Mild left lower facial asymmetry when he smiles only.  Tongue, palate moves normally and symmetrically.  Motor: Spastic left hemiplegia chronic; full strength RUE and RLE Sensory.:  Diminished left hemibody touch , pinprick , position and vibratory sensation.  Coordination: Rapid alternating movements normal on right side. Finger-to-nose and heel-to-shin performed accurately on right side. Gait and Station: deferred. Use of w/c today Reflexes: 2+ and asymmetric and brisker on the left. Toes downgoing.      ASSESSMENT/PLAN: 67 year old Caucasian male with solitary episode of weakness partial onset seizures with secondary generalization on 04/19/2019 likely symptomatic from remote hypertensive right basal ganglia intracerebral hemorrhage in 2011 with residual left spastic hemiplegia. Recent evidence of left frontal semiovale infarct in 11/2021 likely secondary to small vessel disease in setting of COVID without residual deficit.   1.  Seizure, late effect of stroke -Denies recurrent seizure activity or episode -Continue Keppra 500 mg twice daily for seizure prophylaxis -refill provided -EEG 05/2021 normal -Patient not interested in decreasing dosage at this time   2. Hx of R BG ICH 03/2009 3. Ischemic stroke 11/2021 -Residual left spastic hemiparesis -stable, continue HEP. Discussed driving which he is currently doing only very short distance and accompanied by wife, he has not had any difficulty with driving nor does wife have any concerns with his driving.  Discussed reaching out to driving rehab to discuss  adaptive devices for his vehicle to enhance safety -Continue to follow with PCP regularly for continued secondary stroke prevention management    Follow-up in 1 year or call earlier if needed    CC:  Tysinger, Kermit Balo, PA-C    I spent 34 minutes of face-to-face and non-face-to-face time with patient and wife.  This included previsit chart review, lab review, study review, order entry, electronic health record documentation, patient education and discussion regarding above diagnoses and treatment plan and answered all other questions to patient and wife satisfaction   Ihor Austin, AGNP-BC  [facility] [address] [place], [place] [address]  Phone [phone] Fax [phone] Note: This document was prepared with digital dictation and possible smart phrase technology. Any transcriptional errors that result from this process are unintentional.

## 2022-07-13 ENCOUNTER — Ambulatory Visit: Payer: Medicare Other | Attending: Adult Health | Admitting: Adult Health

## 2022-07-13 ENCOUNTER — Encounter: Payer: Self-pay | Admitting: Adult Health

## 2022-07-13 VITALS — BP 122/74 | HR 64 | Ht 69.0 in | Wt 166.0 lb

## 2022-07-13 DIAGNOSIS — I10 Essential (primary) hypertension: Secondary | ICD-10-CM | POA: Diagnosis not present

## 2022-07-13 DIAGNOSIS — I43 Cardiomyopathy in diseases classified elsewhere: Secondary | ICD-10-CM

## 2022-07-13 DIAGNOSIS — G8194 Hemiplegia, unspecified affecting left nondominant side: Secondary | ICD-10-CM

## 2022-07-13 DIAGNOSIS — Z8673 Personal history of transient ischemic attack (TIA), and cerebral infarction without residual deficits: Secondary | ICD-10-CM | POA: Diagnosis not present

## 2022-07-13 DIAGNOSIS — E7800 Pure hypercholesterolemia, unspecified: Secondary | ICD-10-CM

## 2022-07-13 MED ORDER — SPIRONOLACTONE 25 MG PO TABS: 25.0000 mg | ORAL_TABLET | Freq: Every day | ORAL | 3 refills | Status: DC

## 2022-07-13 MED ORDER — SPIRONOLACTONE 25 MG PO TABS: 25.0000 mg | ORAL_TABLET | Freq: Every day | ORAL | 0 refills | Status: DC

## 2022-07-13 NOTE — Patient Instructions (Signed)
Medication Instructions:  No Changes *If you need a refill on your cardiac medications before your next appointment, please call your pharmacy*   Lab Work: CBC,CMET, Lipid Panel.  Today If you have labs (blood work) drawn today and your tests are completely normal, you will receive your results only by: MyChart Message (if you have MyChart) OR A paper copy in the mail If you have any lab test that is abnormal or we need to change your treatment, we will call you to review the results.   Testing/Procedures: No Testing   Follow-Up: At [facility], you and your health needs are our priority.  As part of our continuing mission to provide you with exceptional heart care, we have created designated Provider Care Teams.  These Care Teams include your primary Cardiologist (physician) and Advanced Practice Providers (APPs -  Physician Assistants and Nurse Practitioners) who all work together to provide you with the care you need, when you need it.  We recommend signing up for the patient portal called "MyChart".  Sign up information is provided on this After Visit Summary.  MyChart is used to connect with patients for Virtual Visits (Telemedicine).  Patients are able to view lab/test results, encounter notes, upcoming appointments, etc.  Non-urgent messages can be sent to your provider as well.   To learn more about what you can do with MyChart, go to [email or web address].    Your next appointment:   1 year(s)  Provider:   Olga Millers, MD

## 2022-07-14 ENCOUNTER — Telehealth: Payer: Self-pay

## 2022-07-14 LAB — COMPREHENSIVE METABOLIC PANEL
ALT: 13 IU/L (ref 0–44)
AST: 10 IU/L (ref 0–40)
Albumin: 4.3 g/dL (ref 3.9–4.9)
Alkaline Phosphatase: 81 IU/L (ref 44–121)
BUN/Creatinine Ratio: 20 (ref 10–24)
BUN: 24 mg/dL (ref 8–27)
Bilirubin Total: 0.5 mg/dL (ref 0.0–1.2)
CO2: 25 mmol/L (ref 20–29)
Calcium: 9.5 mg/dL (ref 8.6–10.2)
Chloride: 108 mmol/L — ABNORMAL HIGH (ref 96–106)
Creatinine, Ser: 1.23 mg/dL (ref 0.76–1.27)
Globulin, Total: 2 g/dL (ref 1.5–4.5)
Glucose: 97 mg/dL (ref 70–99)
Potassium: 3.7 mmol/L (ref 3.5–5.2)
Sodium: 145 mmol/L — ABNORMAL HIGH (ref 134–144)
Total Protein: 6.3 g/dL (ref 6.0–8.5)
eGFR: 65 mL/min/{1.73_m2} (ref >59)

## 2022-07-14 LAB — CBC
Hematocrit: 34.7 % — ABNORMAL LOW (ref 37.5–51.0)
Hemoglobin: 11.4 g/dL — ABNORMAL LOW (ref 13.0–17.7)
MCH: 28.8 pg (ref 26.6–33.0)
MCHC: 32.9 g/dL (ref 31.5–35.7)
MCV: 88 fL (ref 79–97)
Platelets: 156 10*3/uL (ref 150–450)
RBC: 3.96 x10E6/uL — ABNORMAL LOW (ref 4.14–5.80)
RDW: 12.7 % (ref 11.6–15.4)
WBC: 6.5 10*3/uL (ref 3.4–10.8)

## 2022-07-14 LAB — LIPID PANEL
Chol/HDL Ratio: 3.2 ratio (ref 0.0–5.0)
Cholesterol, Total: 155 mg/dL (ref 100–199)
HDL: 48 mg/dL (ref >39)
LDL Chol Calc (NIH): 93 mg/dL (ref 0–99)
Triglycerides: 69 mg/dL (ref 0–149)
VLDL Cholesterol Cal: 14 mg/dL (ref 5–40)

## 2022-07-14 NOTE — Telephone Encounter (Addendum)
Results viewed by patient via MyChart.----- Message from Joni Reining sent at 07/14/2022  7:14 AM EDT ----- I have reviewed his labs. He is still anemic, which is his baseline.  Should continue to see his PCP for management of his normocytic anemia.  His levels are not too low to be dangerous. Otherwise labs are essentially normal. Should increase water intake. KL

## 2022-07-19 DIAGNOSIS — R252 Cramp and spasm: Secondary | ICD-10-CM | POA: Diagnosis not present

## 2022-07-19 DIAGNOSIS — G8112 Spastic hemiplegia affecting left dominant side: Secondary | ICD-10-CM | POA: Diagnosis not present

## 2022-07-19 DIAGNOSIS — M6281 Muscle weakness (generalized): Secondary | ICD-10-CM | POA: Diagnosis not present

## 2022-07-19 DIAGNOSIS — R262 Difficulty in walking, not elsewhere classified: Secondary | ICD-10-CM | POA: Diagnosis not present

## 2022-07-30 ENCOUNTER — Other Ambulatory Visit: Payer: Self-pay | Admitting: Adult Health

## 2022-08-02 ENCOUNTER — Other Ambulatory Visit: Payer: Self-pay | Admitting: Cardiology

## 2022-08-02 DIAGNOSIS — I10 Essential (primary) hypertension: Secondary | ICD-10-CM

## 2022-08-03 ENCOUNTER — Other Ambulatory Visit: Payer: Self-pay | Admitting: Cardiology

## 2022-08-04 ENCOUNTER — Other Ambulatory Visit: Payer: Self-pay | Admitting: Cardiology

## 2022-08-04 ENCOUNTER — Other Ambulatory Visit: Payer: Self-pay | Admitting: Medical

## 2022-08-04 DIAGNOSIS — I10 Essential (primary) hypertension: Secondary | ICD-10-CM

## 2022-08-15 ENCOUNTER — Other Ambulatory Visit: Payer: Self-pay | Admitting: Medical

## 2022-08-15 DIAGNOSIS — I10 Essential (primary) hypertension: Secondary | ICD-10-CM

## 2022-08-16 DIAGNOSIS — G8112 Spastic hemiplegia affecting left dominant side: Secondary | ICD-10-CM | POA: Diagnosis not present

## 2022-08-16 DIAGNOSIS — R262 Difficulty in walking, not elsewhere classified: Secondary | ICD-10-CM | POA: Diagnosis not present

## 2022-08-16 DIAGNOSIS — M6281 Muscle weakness (generalized): Secondary | ICD-10-CM | POA: Diagnosis not present

## 2022-08-16 DIAGNOSIS — R252 Cramp and spasm: Secondary | ICD-10-CM | POA: Diagnosis not present

## 2022-08-26 ENCOUNTER — Other Ambulatory Visit: Payer: Self-pay | Admitting: Medical

## 2022-08-29 NOTE — Telephone Encounter (Signed)
Looks like this was discontinued at neurology appointment on 07/11/22

## 2022-09-19 ENCOUNTER — Other Ambulatory Visit: Payer: Self-pay | Admitting: Cardiology

## 2022-09-19 DIAGNOSIS — I10 Essential (primary) hypertension: Secondary | ICD-10-CM

## 2022-09-22 ENCOUNTER — Telehealth: Payer: Self-pay | Admitting: Medical

## 2022-09-22 DIAGNOSIS — R252 Cramp and spasm: Secondary | ICD-10-CM | POA: Diagnosis not present

## 2022-09-22 DIAGNOSIS — M6281 Muscle weakness (generalized): Secondary | ICD-10-CM | POA: Diagnosis not present

## 2022-09-22 DIAGNOSIS — G8112 Spastic hemiplegia affecting left dominant side: Secondary | ICD-10-CM | POA: Diagnosis not present

## 2022-09-22 DIAGNOSIS — R262 Difficulty in walking, not elsewhere classified: Secondary | ICD-10-CM | POA: Diagnosis not present

## 2022-09-22 NOTE — Telephone Encounter (Signed)
Pt family friend dropped off a [place] form, was sent back in [employer].

## 2022-09-22 NOTE — Telephone Encounter (Signed)
I received a form that he is completing from insurance.  Requires a face-to-face visit.  He is due for a physical anyhow also go ahead and get him on the schedule to do this.

## 2022-10-03 ENCOUNTER — Ambulatory Visit (INDEPENDENT_AMBULATORY_CARE_PROVIDER_SITE_OTHER): Payer: Medicare Other | Admitting: Medical

## 2022-10-03 VITALS — BP 124/72 | HR 62 | Wt 157.0 lb

## 2022-10-03 DIAGNOSIS — G8194 Hemiplegia, unspecified affecting left nondominant side: Secondary | ICD-10-CM

## 2022-10-03 DIAGNOSIS — Z23 Encounter for immunization: Secondary | ICD-10-CM | POA: Diagnosis not present

## 2022-10-03 DIAGNOSIS — Z0000 Encounter for general adult medical examination without abnormal findings: Secondary | ICD-10-CM | POA: Diagnosis not present

## 2022-10-03 DIAGNOSIS — I6930 Unspecified sequelae of cerebral infarction: Secondary | ICD-10-CM

## 2022-10-03 DIAGNOSIS — R7301 Impaired fasting glucose: Secondary | ICD-10-CM

## 2022-10-03 DIAGNOSIS — R252 Cramp and spasm: Secondary | ICD-10-CM

## 2022-10-03 DIAGNOSIS — G40909 Epilepsy, unspecified, not intractable, without status epilepticus: Secondary | ICD-10-CM

## 2022-10-03 DIAGNOSIS — I69398 Other sequelae of cerebral infarction: Secondary | ICD-10-CM

## 2022-10-03 DIAGNOSIS — I10 Essential (primary) hypertension: Secondary | ICD-10-CM | POA: Diagnosis not present

## 2022-10-03 DIAGNOSIS — Z993 Dependence on wheelchair: Secondary | ICD-10-CM | POA: Diagnosis not present

## 2022-10-03 DIAGNOSIS — G8110 Spastic hemiplegia affecting unspecified side: Secondary | ICD-10-CM

## 2022-10-03 DIAGNOSIS — Z125 Encounter for screening for malignant neoplasm of prostate: Secondary | ICD-10-CM

## 2022-10-03 DIAGNOSIS — Z2821 Immunization not carried out because of patient refusal: Secondary | ICD-10-CM

## 2022-10-03 DIAGNOSIS — D649 Anemia, unspecified: Secondary | ICD-10-CM | POA: Diagnosis not present

## 2022-10-03 LAB — POCT URINALYSIS DIP (PROADVANTAGE DEVICE)
Bilirubin, UA: NEGATIVE
Blood, UA: NEGATIVE
Glucose, UA: NEGATIVE mg/dL
Ketones, POC UA: NEGATIVE mg/dL
Leukocytes, UA: NEGATIVE
Nitrite, UA: NEGATIVE
Protein Ur, POC: NEGATIVE mg/dL
Specific Gravity, Urine: 1.02
Urobilinogen, Ur: NEGATIVE
pH, UA: 6 (ref 5.0–8.0)

## 2022-10-03 NOTE — Progress Notes (Signed)
Subjective:  Steve Burton is a 67 y.o. male who presents for Chief Complaint  Patient presents with   insurance form    Insurance form-    Annual Exam     Here today with his wife [name].  Current Health Care Team: Dentist, Dr.  Luciano Cutter, NP neurology Dr. Louann Sjogren, podiatry Dr. Olga Millers, cardiology Dr. Janalyn Harder, dermatology Dr. Rob Bunting, GI  We received a form from the [facility] company that needed to be completed.  This is in regards to his prior disability.  He has a history of prior stroke and March or 2011 with subsequent hemiplegia, left arm and leg paralysis, spasm of muscle ongoing issues with this since the stroke.  He is confined to a wheelchair.  He does continue with physical therapy once monthly for maintenance, [facility].     Uses resting splint for left arm for night time.   Uses different arm splint for stretching exercises he does daily.   Uses ASO ankle/lower leg brace for left leg daily  Does home stretching regularly.  Allergies  Allergen Reactions   Phenytoin Sodium Extended Swelling and Other (See Comments)    Dilantin- Arm swelling    Penicillins Rash    Has patient had a PCN reaction causing immediate rash, facial/tongue/throat swelling, SOB or lightheadedness with hypotension: Unknown Has patient had a PCN reaction causing severe rash involving mucus membranes or skin necrosis: Unk Has patient had a PCN reaction that required hospitalization: Unk Has patient had a PCN reaction occurring within the last 10 years: Yes If all of the above answers are "NO", then may proceed with Cephalosporin use.     Past Medical History:  Diagnosis Date   Elevated LDL cholesterol level    Elevated PSA measurement 11/22/2016   Hyperlipidemia    Hypertension    Impaired fasting glucose 11/22/2016   Left hemiparesis (HCC)    Spastic hemiparesis (HCC)    Stroke (HCC) 2011      Current Outpatient Medications:     aspirin EC 81 MG tablet, Take 1 tablet (81 mg total) by mouth daily. Swallow whole., Disp: 30 tablet, Rfl: 0   atorvastatin (LIPITOR) 10 MG tablet, TAKE 1 TABLET BY MOUTH DAILY, Disp: 100 tablet, Rfl: 2   baclofen (LIORESAL) 10 MG tablet, TAKE 1 TABLET BY MOUTH 3 TIMES  DAILY, Disp: 270 tablet, Rfl: 1   diltiazem (CARDIZEM CD) 240 MG 24 hr capsule, TAKE 1 CAPSULE BY MOUTH DAILY, Disp: 90 capsule, Rfl: 3   hydrALAZINE (APRESOLINE) 50 MG tablet, TAKE 1 TABLET BY MOUTH 3 TIMES  DAILY, Disp: 300 tablet, Rfl: 2   labetalol (NORMODYNE) 100 MG tablet, TAKE 1 TABLET BY MOUTH TWICE  DAILY, Disp: 200 tablet, Rfl: 3   levETIRAcetam (KEPPRA) 500 MG tablet, Take 1 tablet (500 mg total) by mouth 2 (two) times daily., Disp: 180 tablet, Rfl: 3   losartan (COZAAR) 100 MG tablet, TAKE 1 TABLET BY MOUTH DAILY, Disp: 100 tablet, Rfl: 1   potassium chloride SA (KLOR-CON M) 20 MEQ tablet, TAKE 1 TABLET BY MOUTH TWICE  DAILY, Disp: 200 tablet, Rfl: 2   spironolactone (ALDACTONE) 25 MG tablet, Take 1 tablet (25 mg total) by mouth daily., Disp: 90 tablet, Rfl: 3   acetaminophen (TYLENOL) 500 MG tablet, Take 500 mg by mouth every 6 (six) hours as needed for mild pain. (Patient not taking: Reported on 10/03/2022), Disp: , Rfl:   Family History  Problem Relation Age of Onset  Stroke Mother    Heart disease Father        MI at age 58   Epilepsy Sister    Colon cancer Neg Hx     Past Surgical History:  Procedure Laterality Date   CATARACT EXTRACTION  01/04/2004   right eye   HERNIA REPAIR Right 01/04/2003   inguinal    No other aggravating or relieving factors.    No other c/o.  The following portions of the patient's history were reviewed and updated as appropriate: allergies, current medications, past family history, past medical history, past social history, past surgical history and problem list.  ROS Otherwise as in subjective above    Objective: BP 124/72   Pulse 62   Wt 157 lb (71.2 kg)   BMI  23.18 kg/m   Wt Readings from Last 3 Encounters:  10/03/22 157 lb (71.2 kg)  07/13/22 166 lb (75.3 kg)  04/11/22 163 lb 6.4 oz (74.1 kg)    General appearance: alert, no distress, well developed, well nourished Seated in wheelchair HEENT: normocephalic, sclerae anicteric, conjunctiva pink and moist, TMs pearly, nares patent, no discharge or erythema, pharynx normal Oral cavity: MMM, no lesions Neck: supple, no lymphadenopathy, no thyromegaly, no masses, no bruits Heart: RRR, normal S1, S2, no murmurs Lungs: CTA bilaterally, no wheezes, rhonchi, or rales Abdomen: +bs, soft, non tender, non distended, no masses, no hepatomegaly, no splenomegaly MSK: Left arm nontender, left wrist and hand in somewhat of a flexion deformity spasticity with which is limited, left lower leg with some spasticity but normal range of motion, otherwise arms and legs nontender without deformity Neuro: Spasticity of left arm and leg, weakness of left arm in general with limited use, left leg with some strength but not quite the same as the right leg.  Rest of neuro exam unremarkable Pulses: 2+ radial pulses, 2+ pedal pulses, normal cap refill Ext: no edema Psych: pleasant, good eye contact, answers questions appropriately    Assessment: Encounter Diagnoses  Name Primary?   Encounter for health maintenance examination in adult Yes   Medicare annual wellness visit, subsequent    Left hemiparesis (HCC)    Essential hypertension    History of hemorrhagic cerebrovascular accident (CVA) with residual deficit    Wheelchair dependence    Spastic hemiparesis (HCC)    Spasticity due to old stroke    Seizure disorder (HCC)    Impaired fasting blood sugar    Need for pneumococcal 20-valent conjugate vaccination    Influenza vaccination declined    Anemia, unspecified type    Screening for prostate cancer      Plan: I completed his insurance form for ongoing disability verification, hx/o stroke and chronic left  hemiparesis of left arm and leg  This visit was a preventative care visit, also known as wellness visit or routine physical.   Topics typically include healthy lifestyle, diet, exercise, preventative care, vaccinations, sick and well care, proper use of emergency dept and after hours care, as well as other concerns.     Recommendations: Continue to return yearly for your annual wellness and preventative care visits.  This gives [place] a chance to discuss healthy lifestyle, exercise, vaccinations, review your chart record, and perform screenings where appropriate.  I recommend you see your eye doctor yearly for routine vision care.  I recommend you see your dentist yearly for routine dental care including hygiene visits twice yearly.   Vaccination recommendations were reviewed Immunization History  Administered Date(s) Administered   [employer]  Sars-Covid-2 Vaccination 04/30/2019, 05/28/2019   PNEUMOCOCCAL CONJUGATE-20 10/03/2022   Td 07/25/2014    Counseled on the pneumococcal vaccine.  Vaccine information sheet given.  Pneumococcal vaccine Prevnar 20 given after consent obtained.  Declines flu shot   Screening for cancer: Colon cancer screening: Colonoscopy from 2016 reviewed, negative FOBT test 2023  Testicular cancer screening You should do a monthly self testicular exam if you are between 4-85 years old  We discussed PSA, prostate exam, and prostate cancer screening risks/benefits.     Skin cancer screening: Check your skin regularly for new changes, growing lesions, or other lesions of concern Come in for evaluation if you have skin lesions of concern.  Lung cancer screening: If you have a greater than 20 pack year history of tobacco use, then you may qualify for lung cancer screening with a chest CT scan.   Please call your insurance company to inquire about coverage for this test.  We currently don't have screenings for other cancers besides breast, cervical, colon, and  lung cancers.  If you have a strong family history of cancer or have other cancer screening concerns, please let me know.    Bone health: Get at least 150 minutes of aerobic exercise weekly Get weight bearing exercise at least once weekly Bone density test:  A bone density test is an imaging test that uses a type of X-ray to measure the amount of calcium and other minerals in your bones. The test may be used to diagnose or screen you for a condition that causes weak or thin bones (osteoporosis), predict your risk for a broken bone (fracture), or determine how well your osteoporosis treatment is working. The bone density test is recommended for females 65 and older, or females or males <65 if certain risk factors such as thyroid disease, long term use of steroids such as for asthma or rheumatological issues, vitamin D deficiency, estrogen deficiency, family history of osteoporosis, self or family history of fragility fracture in first degree relative.    Heart health: Get at least 150 minutes of aerobic exercise weekly Limit alcohol It is important to maintain a healthy blood pressure and healthy cholesterol numbers  Heart disease screening: Screening for heart disease includes screening for blood pressure, fasting lipids, glucose/diabetes screening, BMI height to weight ratio, reviewed of smoking status, physical activity, and diet.    Goals include blood pressure 120/80 or less, maintaining a healthy lipid/cholesterol profile, preventing diabetes or keeping diabetes numbers under good control, not smoking or using tobacco products, exercising most days per week or at least 150 minutes per week of exercise, and eating healthy variety of fruits and vegetables, healthy oils, and avoiding unhealthy food choices like fried food, fast food, high sugar and high cholesterol foods.     Medical care options: I recommend you continue to seek care here first for routine care.  We try really hard to  have available appointments Monday through Friday daytime hours for sick visits, acute visits, and physicals.  Urgent care should be used for after hours and weekends for significant issues that cannot wait till the next day.  The emergency department should be used for significant potentially life-threatening emergencies.  The emergency department is expensive, can often have long wait times for less significant concerns, so try to utilize primary care, urgent care, or telemedicine when possible to avoid unnecessary trips to the emergency department.  Virtual visits and telemedicine have been introduced since the pandemic started in 2020, and can be convenient  ways to receive medical care.  We offer virtual appointments as well to assist you in a variety of options to seek medical care.    Separate significant issues discussed: Hypertension-continue current medication  Hyperlipidemia-continue current medication.  I reviewed recent blood work in the chart record at goal  History of seizures-on Keppra, sees neurology  History of stroke, history of left-sided hemiparesis left arm and left leg.  Continue physical therapy monthly for maintenance.  He relies on wheelchair or cane   Deonta was seen today for insurance form and annual exam.  Diagnoses and all orders for this visit:  Encounter for health maintenance examination in adult -     PSA -     CBC -     Iron -     Vitamin B12 -     Folate -     POCT Urinalysis DIP (Proadvantage Device)  Medicare annual wellness visit, subsequent  Left hemiparesis (HCC)  Essential hypertension  History of hemorrhagic cerebrovascular accident (CVA) with residual deficit  Wheelchair dependence  Spastic hemiparesis (HCC)  Spasticity due to old stroke  Seizure disorder (HCC)  Impaired fasting blood sugar  Need for pneumococcal 20-valent conjugate vaccination -     Pneumococcal conjugate vaccine 20-valent (Prevnar 20)  Influenza vaccination  declined  Anemia, unspecified type -     CBC -     Iron -     Vitamin B12 -     Folate  Screening for prostate cancer -     PSA   Follow-up pending labs, yearly for physical

## 2022-10-04 ENCOUNTER — Other Ambulatory Visit: Payer: Self-pay | Admitting: Medical

## 2022-10-04 LAB — VITAMIN B12: Vitamin B-12: 483 pg/mL (ref 232–1245)

## 2022-10-04 LAB — IRON: Iron: 58 ug/dL (ref 38–169)

## 2022-10-04 LAB — CBC
Hematocrit: 38.1 % (ref 37.5–51.0)
Hemoglobin: 12 g/dL — ABNORMAL LOW (ref 13.0–17.7)
MCH: 28.8 pg (ref 26.6–33.0)
MCHC: 31.5 g/dL (ref 31.5–35.7)
MCV: 92 fL (ref 79–97)
Platelets: 164 10*3/uL (ref 150–450)
RBC: 4.16 x10E6/uL (ref 4.14–5.80)
RDW: 13.1 % (ref 11.6–15.4)
WBC: 7.6 10*3/uL (ref 3.4–10.8)

## 2022-10-04 LAB — PSA: Prostate Specific Ag, Serum: 1.8 ng/mL (ref 0.0–4.0)

## 2022-10-04 LAB — FOLATE: Folate: >20 ng/mL (ref >3.0)

## 2022-10-04 MED ORDER — HYDRALAZINE HCL 50 MG PO TABS: 50.0000 mg | ORAL_TABLET | Freq: Three times a day (TID) | ORAL | 2 refills | Status: DC

## 2022-10-04 MED ORDER — ASPIRIN 81 MG PO TBEC: 81.0000 mg | DELAYED_RELEASE_TABLET | Freq: Every day | ORAL | 3 refills | Status: DC

## 2022-10-04 NOTE — Progress Notes (Signed)
Labs show mild anemia longstanding, hemoglobin at 12.  Iron level was normal, B12 normal, folate normal, PSA prostate marker normal.  I recommend either referral back for colonoscopy now given the anemia or repeating stool test like he did last year.  See which way he wants to proceed.

## 2022-10-11 DIAGNOSIS — M6281 Muscle weakness (generalized): Secondary | ICD-10-CM | POA: Diagnosis not present

## 2022-10-11 DIAGNOSIS — R252 Cramp and spasm: Secondary | ICD-10-CM | POA: Diagnosis not present

## 2022-10-11 DIAGNOSIS — R262 Difficulty in walking, not elsewhere classified: Secondary | ICD-10-CM | POA: Diagnosis not present

## 2022-10-11 DIAGNOSIS — G8112 Spastic hemiplegia affecting left dominant side: Secondary | ICD-10-CM | POA: Diagnosis not present

## 2022-10-20 ENCOUNTER — Other Ambulatory Visit: Payer: Self-pay | Admitting: Medical

## 2022-11-07 ENCOUNTER — Other Ambulatory Visit: Payer: Self-pay | Admitting: Cardiology

## 2022-11-07 ENCOUNTER — Other Ambulatory Visit: Payer: Self-pay | Admitting: Medical

## 2022-11-11 ENCOUNTER — Telehealth: Payer: Self-pay

## 2022-11-11 ENCOUNTER — Telehealth (INDEPENDENT_AMBULATORY_CARE_PROVIDER_SITE_OTHER): Payer: Medicare Other | Admitting: Medical

## 2022-11-11 ENCOUNTER — Other Ambulatory Visit: Payer: Self-pay | Admitting: Medical

## 2022-11-11 ENCOUNTER — Other Ambulatory Visit: Payer: Self-pay | Admitting: Internal Medicine

## 2022-11-11 DIAGNOSIS — R519 Headache, unspecified: Secondary | ICD-10-CM | POA: Diagnosis not present

## 2022-11-11 DIAGNOSIS — R220 Localized swelling, mass and lump, head: Secondary | ICD-10-CM

## 2022-11-11 MED ORDER — AZITHROMYCIN 500 MG PO TABS: 500.0000 mg | ORAL_TABLET | Freq: Three times a day (TID) | ORAL | 1 refills | Status: DC

## 2022-11-11 MED ORDER — CLINDAMYCIN HCL 300 MG PO CAPS: 300.0000 mg | ORAL_CAPSULE | Freq: Three times a day (TID) | ORAL | 1 refills | Status: AC

## 2022-11-11 NOTE — Progress Notes (Addendum)
Subjective:     Patient ID: Steve Burton, male   DOB: 03-08-1955, 67 y.o.   MRN: [id number]  This visit type was conducted due to national recommendations for restrictions regarding the COVID-19 Pandemic (e.g. social distancing) in an effort to limit this patient's exposure and mitigate transmission in our community.  Due to their co-morbid illnesses, this patient is at least at moderate risk for complications without adequate follow up.  This format is felt to be most appropriate for this patient at this time.    Documentation for virtual audio and video telecommunications through [facility] encounter:  The patient was located at home. The provider was located in the office. The patient did consent to this visit and is aware of possible charges through their insurance for this visit.  The other persons participating in this telemedicine service were wife. Time spent on call was 20 minutes and in review of previous records 20 minutes total.  This virtual service is not related to other E/M service within previous 7 days.   HPI Chief Complaint  Patient presents with   Facial Swelling    Face swelling- infection in tooth. Having tooth pulled on on November 20th and needs antibiotic for infection   Virtual consult for concerns.  He went to the dentist recently.  There is a right lower tooth that is giving him some problems.  The dentist put him on azithromycin 500 mg 3 times a day for 5 days since it was swollen and infected and causing pain.  They referred him to a different dentist to have the tooth pulled out but will not be till November 20  He just finished antibiotic a week ago but as of this morning the right lower face is swelling up again like it did recently.  He does not have any pain currently though.  No fever.  No body aches or chills.  No sour taste in the mouth.  No redness or warmth.  Otherwise normal state of health  Sees dentist Dr. Cheral Bay  No other aggravating or  relieving factors. No other complaint.   Past Medical History:  Diagnosis Date   Elevated LDL cholesterol level    Elevated PSA measurement 11/22/2016   Hyperlipidemia    Hypertension    Impaired fasting glucose 11/22/2016   Left hemiparesis (HCC)    Spastic hemiparesis (HCC)    Stroke [facility]) 2011   Current Outpatient Medications on File Prior to Visit  Medication Sig Dispense Refill   acetaminophen (TYLENOL) 500 MG tablet Take 500 mg by mouth every 6 (six) hours as needed for mild pain (pain score 1-3).     aspirin EC 81 MG tablet Take 1 tablet (81 mg total) by mouth daily. Swallow whole. 90 tablet 3   atorvastatin (LIPITOR) 10 MG tablet TAKE 1 TABLET BY MOUTH DAILY 100 tablet 2   baclofen (LIORESAL) 10 MG tablet TAKE 1 TABLET BY MOUTH 3 TIMES  DAILY 270 tablet 1   diltiazem (CARDIZEM CD) 240 MG 24 hr capsule TAKE 1 CAPSULE BY MOUTH DAILY 90 capsule 3   hydrALAZINE (APRESOLINE) 50 MG tablet TAKE 1 TABLET BY MOUTH 3 TIMES  DAILY 300 tablet 2   labetalol (NORMODYNE) 100 MG tablet TAKE 1 TABLET BY MOUTH TWICE  DAILY 200 tablet 3   labetalol (NORMODYNE) 200 MG tablet TAKE 1 TABLET BY MOUTH TWICE  DAILY 180 tablet 2   levETIRAcetam (KEPPRA) 500 MG tablet Take 1 tablet (500 mg total) by mouth 2 (two) times  daily. 180 tablet 3   losartan (COZAAR) 100 MG tablet TAKE 1 TABLET BY MOUTH DAILY 100 tablet 1   potassium chloride SA (KLOR-CON M) 20 MEQ tablet TAKE 1 TABLET BY MOUTH TWICE  DAILY 200 tablet 2   spironolactone (ALDACTONE) 25 MG tablet Take 1 tablet (25 mg total) by mouth daily. 90 tablet 3   No current facility-administered medications on file prior to visit.    Review of Systems As in subjective    Objective:   Physical Exam Due to coronavirus pandemic stay at home measures, patient visit was virtual and they were not examined in person.   Gen: wd, wn, nad Due to some video malfunction today I was not able to see his face     Assessment:     Encounter Diagnoses  Name  Primary?   Mouth swelling Yes   Facial pain        Plan:     Advised salt water rinses, routine hygiene but begin Aleve over-the-counter twice a day through the weekend starting today Friday.  If any change including pain, worsening swelling or redness like he had before then begin antibiotic below.  If not, just do Aleve over the weekend and use a watch and wait approach  Advised they contact us or dentist back a few days before his procedure to give an update on symptoms  Kyson was seen today for facial swelling.  Diagnoses and all orders for this visit:  Mouth swelling  Facial pain  Other orders -     Discontinue: azithromycin (ZITHROMAX) 500 MG tablet; Take 1 tablet (500 mg total) by mouth in the morning, at noon, and at bedtime.  Fu prn

## 2022-11-11 NOTE — Telephone Encounter (Signed)
Pt's dentis prescribed azithromycin 500 mg and has 0 refills. Wants to know if [name] can send it in to [facility] on [address].

## 2022-11-11 NOTE — Telephone Encounter (Signed)
Was given zithromycin 500mg  every 8 hours #15 sent in to [facility] in [place] but wants medication sent to [facility]

## 2022-11-11 NOTE — Telephone Encounter (Signed)
Pt's wife was notified  

## 2022-11-11 NOTE — Telephone Encounter (Signed)
Please call Rocci and his wife [name] back.  The dosing for azithromycin that I prescribed is unusual but I was basing this off what they told me his prior medicine showed  I wonder if they meant clindamycin and not azithromycin.  Clindamycin is often prescribed 300 mg 3 times a day

## 2022-11-15 DIAGNOSIS — R252 Cramp and spasm: Secondary | ICD-10-CM | POA: Diagnosis not present

## 2022-11-15 DIAGNOSIS — R262 Difficulty in walking, not elsewhere classified: Secondary | ICD-10-CM | POA: Diagnosis not present

## 2022-11-15 DIAGNOSIS — M6281 Muscle weakness (generalized): Secondary | ICD-10-CM | POA: Diagnosis not present

## 2022-11-15 DIAGNOSIS — G8112 Spastic hemiplegia affecting left dominant side: Secondary | ICD-10-CM | POA: Diagnosis not present

## 2022-12-13 DIAGNOSIS — M6281 Muscle weakness (generalized): Secondary | ICD-10-CM | POA: Diagnosis not present

## 2022-12-13 DIAGNOSIS — R252 Cramp and spasm: Secondary | ICD-10-CM | POA: Diagnosis not present

## 2022-12-13 DIAGNOSIS — G8112 Spastic hemiplegia affecting left dominant side: Secondary | ICD-10-CM | POA: Diagnosis not present

## 2022-12-13 DIAGNOSIS — R262 Difficulty in walking, not elsewhere classified: Secondary | ICD-10-CM | POA: Diagnosis not present

## 2022-12-15 ENCOUNTER — Telehealth: Payer: Self-pay | Admitting: Internal Medicine

## 2022-12-15 NOTE — Telephone Encounter (Signed)
Hello [name]! I would like to ask you for a refill for Steve Burton for the Lorazepam 0.5 mg, if you could be so kind to send it to the [facility] at the [address] location , [phone]. It's good to have it here at the house just in case he's anxious. I have 2/3 left in the container .  Thank you so much!!! Happy Holidays!!! Blessings!!  [name]

## 2022-12-16 ENCOUNTER — Other Ambulatory Visit: Payer: Self-pay | Admitting: Medical

## 2022-12-16 MED ORDER — LORAZEPAM 0.5 MG PO TABS: 0.5000 mg | ORAL_TABLET | Freq: Two times a day (BID) | ORAL | 0 refills | Status: DC | PRN

## 2023-01-26 ENCOUNTER — Other Ambulatory Visit: Payer: Self-pay | Admitting: Medical

## 2023-01-30 ENCOUNTER — Other Ambulatory Visit: Payer: Self-pay | Admitting: Medical

## 2023-01-30 ENCOUNTER — Other Ambulatory Visit: Payer: Self-pay | Admitting: Adult Health

## 2023-01-30 DIAGNOSIS — I10 Essential (primary) hypertension: Secondary | ICD-10-CM

## 2023-01-30 NOTE — Telephone Encounter (Signed)
Rx refilled per last office note

## 2023-01-31 DIAGNOSIS — R252 Cramp and spasm: Secondary | ICD-10-CM | POA: Diagnosis not present

## 2023-01-31 DIAGNOSIS — R262 Difficulty in walking, not elsewhere classified: Secondary | ICD-10-CM | POA: Diagnosis not present

## 2023-01-31 DIAGNOSIS — M6281 Muscle weakness (generalized): Secondary | ICD-10-CM | POA: Diagnosis not present

## 2023-01-31 DIAGNOSIS — G8112 Spastic hemiplegia affecting left dominant side: Secondary | ICD-10-CM | POA: Diagnosis not present

## 2023-02-28 DIAGNOSIS — R262 Difficulty in walking, not elsewhere classified: Secondary | ICD-10-CM | POA: Diagnosis not present

## 2023-02-28 DIAGNOSIS — R252 Cramp and spasm: Secondary | ICD-10-CM | POA: Diagnosis not present

## 2023-02-28 DIAGNOSIS — G8112 Spastic hemiplegia affecting left dominant side: Secondary | ICD-10-CM | POA: Diagnosis not present

## 2023-02-28 DIAGNOSIS — M6281 Muscle weakness (generalized): Secondary | ICD-10-CM | POA: Diagnosis not present

## 2023-03-07 DIAGNOSIS — G8112 Spastic hemiplegia affecting left dominant side: Secondary | ICD-10-CM | POA: Diagnosis not present

## 2023-03-07 DIAGNOSIS — R262 Difficulty in walking, not elsewhere classified: Secondary | ICD-10-CM | POA: Diagnosis not present

## 2023-03-07 DIAGNOSIS — R252 Cramp and spasm: Secondary | ICD-10-CM | POA: Diagnosis not present

## 2023-03-07 DIAGNOSIS — M6281 Muscle weakness (generalized): Secondary | ICD-10-CM | POA: Diagnosis not present

## 2023-03-14 DIAGNOSIS — R262 Difficulty in walking, not elsewhere classified: Secondary | ICD-10-CM | POA: Diagnosis not present

## 2023-03-14 DIAGNOSIS — R252 Cramp and spasm: Secondary | ICD-10-CM | POA: Diagnosis not present

## 2023-03-14 DIAGNOSIS — M6281 Muscle weakness (generalized): Secondary | ICD-10-CM | POA: Diagnosis not present

## 2023-03-14 DIAGNOSIS — G8112 Spastic hemiplegia affecting left dominant side: Secondary | ICD-10-CM | POA: Diagnosis not present

## 2023-03-20 ENCOUNTER — Other Ambulatory Visit: Payer: Self-pay | Admitting: Medical

## 2023-03-20 DIAGNOSIS — R238 Other skin changes: Secondary | ICD-10-CM

## 2023-03-20 DIAGNOSIS — L814 Other melanin hyperpigmentation: Secondary | ICD-10-CM

## 2023-03-20 MED ORDER — CARISOPRODOL 350 MG PO TABS: 350.0000 mg | ORAL_TABLET | Freq: Two times a day (BID) | ORAL | 0 refills | Status: DC

## 2023-03-20 NOTE — Telephone Encounter (Signed)
 Please schedule follow-up in 2 weeks virtual or in person

## 2023-03-21 DIAGNOSIS — G8112 Spastic hemiplegia affecting left dominant side: Secondary | ICD-10-CM | POA: Diagnosis not present

## 2023-03-21 DIAGNOSIS — R262 Difficulty in walking, not elsewhere classified: Secondary | ICD-10-CM | POA: Diagnosis not present

## 2023-03-21 DIAGNOSIS — M6281 Muscle weakness (generalized): Secondary | ICD-10-CM | POA: Diagnosis not present

## 2023-03-21 DIAGNOSIS — R252 Cramp and spasm: Secondary | ICD-10-CM | POA: Diagnosis not present

## 2023-03-22 ENCOUNTER — Other Ambulatory Visit: Payer: Self-pay | Admitting: Medical

## 2023-03-22 ENCOUNTER — Other Ambulatory Visit: Payer: Self-pay | Admitting: Adult Health

## 2023-03-23 ENCOUNTER — Other Ambulatory Visit: Payer: Self-pay | Admitting: Medical

## 2023-03-28 DIAGNOSIS — R262 Difficulty in walking, not elsewhere classified: Secondary | ICD-10-CM | POA: Diagnosis not present

## 2023-03-28 DIAGNOSIS — G8112 Spastic hemiplegia affecting left dominant side: Secondary | ICD-10-CM | POA: Diagnosis not present

## 2023-03-28 DIAGNOSIS — R252 Cramp and spasm: Secondary | ICD-10-CM | POA: Diagnosis not present

## 2023-03-28 DIAGNOSIS — M6281 Muscle weakness (generalized): Secondary | ICD-10-CM | POA: Diagnosis not present

## 2023-03-30 ENCOUNTER — Telehealth: Payer: Self-pay | Admitting: Internal Medicine

## 2023-03-30 ENCOUNTER — Other Ambulatory Visit: Payer: Self-pay | Admitting: Medical

## 2023-03-30 MED ORDER — CARISOPRODOL 350 MG PO TABS: 350.0000 mg | ORAL_TABLET | Freq: Two times a day (BID) | ORAL | 0 refills | Status: DC

## 2023-03-30 NOTE — Telephone Encounter (Signed)
 Spoke with patient and says he is doing well on SOMA. It has made him more relaxed for PT and can see a difference with it. He is taking once in am and once in pm.   They have an virtual appt on April 3rd but would like a rx sent to [facility]

## 2023-03-30 NOTE — Telephone Encounter (Signed)
 Copied from CRM [id number]. Topic: Clinical - Prescription Issue >> Mar 30, 2023  9:34 AM Ivette P wrote: Reason for CRM: pt is requesting that medication carisoprodol (SOMA) 350 MG tablet be sent to [facility] delivery instead of the [facility].    Please send medicaiton to [facility]:  [facility] - [place], [place] - [phone] [address] [place] [address] Phone: [phone]  Fax: [phone]    Pt callback [phone]

## 2023-04-04 DIAGNOSIS — M6281 Muscle weakness (generalized): Secondary | ICD-10-CM | POA: Diagnosis not present

## 2023-04-04 DIAGNOSIS — R252 Cramp and spasm: Secondary | ICD-10-CM | POA: Diagnosis not present

## 2023-04-04 DIAGNOSIS — G8112 Spastic hemiplegia affecting left dominant side: Secondary | ICD-10-CM | POA: Diagnosis not present

## 2023-04-04 DIAGNOSIS — R262 Difficulty in walking, not elsewhere classified: Secondary | ICD-10-CM | POA: Diagnosis not present

## 2023-04-05 ENCOUNTER — Telehealth: Admitting: Medical

## 2023-04-05 VITALS — BP 130/82 | Temp 97.0°F

## 2023-04-05 DIAGNOSIS — R252 Cramp and spasm: Secondary | ICD-10-CM | POA: Diagnosis not present

## 2023-04-05 DIAGNOSIS — I10 Essential (primary) hypertension: Secondary | ICD-10-CM

## 2023-04-05 DIAGNOSIS — I69398 Other sequelae of cerebral infarction: Secondary | ICD-10-CM

## 2023-04-05 DIAGNOSIS — G8194 Hemiplegia, unspecified affecting left nondominant side: Secondary | ICD-10-CM | POA: Diagnosis not present

## 2023-04-05 DIAGNOSIS — G8110 Spastic hemiplegia affecting unspecified side: Secondary | ICD-10-CM | POA: Diagnosis not present

## 2023-04-05 DIAGNOSIS — Z8673 Personal history of transient ischemic attack (TIA), and cerebral infarction without residual deficits: Secondary | ICD-10-CM

## 2023-04-05 NOTE — Progress Notes (Signed)
 Please contact prior authorization team about trying to get Steve Burton approved   He has been on baclofen long-term but was requiring 3 or 4 times a day dosing.  Thus I want to use Soma for less pill burden and better efficacy   [name]

## 2023-04-05 NOTE — Progress Notes (Signed)
 Subjective:     Patient ID: Steve Burton, male   DOB: January 08, 1955, 68 y.o.   MRN: [id number]  This visit type was conducted due to national recommendations for restrictions regarding the COVID-19 Pandemic (e.g. social distancing) in an effort to limit this patient's exposure and mitigate transmission in our community.  Due to their co-morbid illnesses, this patient is at least at moderate risk for complications without adequate follow up.  This format is felt to be most appropriate for this patient at this time.    Documentation for virtual audio and video telecommunications through [place] encounter:  The patient was located at home. The provider was located in the office. The patient did consent to this visit and is aware of possible charges through their insurance for this visit.  The other persons participating in this telemedicine service were none. Time spent on call was 20 minutes and in review of previous records 20 minutes total.  This virtual service is not related to other E/M service within previous 7 days.   HPI Chief Complaint  Patient presents with   Follow-up    Follow-up on SOMA. Going well.    Virtual visit for med check.  Accompanied by wife [name]  He had recently called in for refill on baclofen.  I suggested Soma as an alternative to try it twice a day instead of doing the baclofen 3 or 4 times a day.  He started to [place] and although he may not personally feel a huge difference, [name] says that his therapist does feel like there has been some improvements in his spasticity and tension in his arm and leg  He is going to run out soon on the [place].  He does have some leftover baclofen just in case  He sees physical therapist once a week long-term  He eats typically breakfast and lunch, and eats a snack in the evening  He denies any significant sedation with Soma.  He will need refills [facility] to [facility]  He is compliant with his other medicines as  usual  No issues with his blood pressure medicine or cholesterol medicine.  No other aggravating or relieving factors. No other complaint.    Past Medical History:  Diagnosis Date   Elevated LDL cholesterol level    Elevated PSA measurement 11/22/2016   Hyperlipidemia    Hypertension    Impaired fasting glucose 11/22/2016   Left hemiparesis (HCC)    Spastic hemiparesis (HCC)    Stroke (HCC) 2011   Current Outpatient Medications on File Prior to Visit  Medication Sig Dispense Refill   aspirin EC 81 MG tablet Take 1 tablet (81 mg total) by mouth daily. Swallow whole. 90 tablet 3   atorvastatin (LIPITOR) 10 MG tablet TAKE 1 TABLET BY MOUTH DAILY 100 tablet 1   carisoprodol (SOMA) 350 MG tablet Take 1 tablet (350 mg total) by mouth in the morning and at bedtime. 180 tablet 0   diltiazem (CARDIZEM CD) 240 MG 24 hr capsule TAKE 1 CAPSULE BY MOUTH DAILY 90 capsule 3   hydrALAZINE (APRESOLINE) 50 MG tablet TAKE 1 TABLET BY MOUTH 3 TIMES  DAILY 300 tablet 2   labetalol (NORMODYNE) 100 MG tablet TAKE 1 TABLET BY MOUTH TWICE  DAILY 200 tablet 3   levETIRAcetam (KEPPRA) 500 MG tablet TAKE 1 TABLET BY MOUTH TWICE  DAILY 200 tablet 2   LORazepam (ATIVAN) 0.5 MG tablet TAKE ONE TABLET BY MOUTH TWICE A DAY AS NEEDED FOR ANXIETY 45 tablet 1  losartan (COZAAR) 100 MG tablet TAKE 1 TABLET BY MOUTH DAILY 100 tablet 2   potassium chloride SA (KLOR-CON M) 20 MEQ tablet TAKE 1 TABLET BY MOUTH TWICE  DAILY 200 tablet 2   spironolactone (ALDACTONE) 25 MG tablet TAKE 1 TABLET BY MOUTH DAILY 100 tablet 0   acetaminophen (TYLENOL) 500 MG tablet Take 500 mg by mouth every 6 (six) hours as needed for mild pain (pain score 1-3).     No current facility-administered medications on file prior to visit.    Review of Systems As in subjective    Objective:   Physical Exam Due to coronavirus pandemic stay at home measures, patient visit was virtual and they were not examined in person.   BP 130/82   Temp (!)  97 F (36.1 C)   SpO2 97%    Gen: wd, wn, nad Psych: Pleasant, answers questions appropriately      Assessment:     Encounter Diagnoses  Name Primary?   Spastic hemiparesis (HCC) Yes   Spasticity due to old stroke    Left hemiparesis (HCC)    Essential hypertension    History of stroke        Plan:     Overall doing okay.    Regarding history of stroke, spasticity due to old stroke, left hemiparesis :  his wife feels like the therapist sees improvements in his spasticity since changing from baclofen to [place] although he does not report a significant improvement  He has been on baclofen 3 times a day long-term but was requesting bumping of the 4 times a day recently by MyChart message  We will see if we can get Tresa Garter approved through insurance  Continue other medicines as usual   Dushaun was seen today for follow-up.  Diagnoses and all orders for this visit:  Spastic hemiparesis (HCC)  Spasticity due to old stroke  Left hemiparesis [facility])  Essential hypertension  History of stroke    F/u yearly for well visit

## 2023-04-06 ENCOUNTER — Other Ambulatory Visit: Payer: Self-pay | Admitting: Medical

## 2023-04-06 MED ORDER — BACLOFEN 10 MG PO TABS: 10.0000 mg | ORAL_TABLET | Freq: Three times a day (TID) | ORAL | 0 refills | Status: DC

## 2023-04-07 ENCOUNTER — Other Ambulatory Visit (HOSPITAL_COMMUNITY): Payer: Self-pay

## 2023-04-07 ENCOUNTER — Telehealth: Payer: Self-pay

## 2023-04-07 NOTE — Telephone Encounter (Signed)
 Pharmacy Patient Advocate Encounter   Received notification from Physician's Office that prior authorization for Carisoprodol 350MG  tablets is required/requested.   Insurance verification completed.   The patient is insured through [facility] MEDICARE PARTD.   Per test claim: PA required; PA submitted to above mentioned insurance via CoverMyMeds Key/confirmation #/EOC (Key: [id number])   Status is pending

## 2023-04-10 NOTE — Telephone Encounter (Signed)
 Pharmacy Patient Advocate Encounter  Received notification from [facility] MEDICARE PARTD that Prior Authorization for Carisoprodol 350MG  tablets has been DENIED.  Full denial letter will be uploaded to the media tab. See denial reason below.     PA #/Case ID/Reference #: (Key: [id number])

## 2023-04-10 NOTE — Telephone Encounter (Signed)
 I received a fax of covered Alt

## 2023-04-11 ENCOUNTER — Other Ambulatory Visit: Payer: Self-pay | Admitting: Medical

## 2023-04-11 NOTE — Telephone Encounter (Signed)
 Left message for pt to call me back

## 2023-04-12 ENCOUNTER — Other Ambulatory Visit: Payer: Self-pay | Admitting: Medical

## 2023-04-13 NOTE — Addendum Note (Signed)
 Addended by: Herminio Commons A on: 04/13/2023 08:37 AM   Modules accepted: Orders

## 2023-04-13 NOTE — Telephone Encounter (Signed)
 See my chart message

## 2023-04-17 ENCOUNTER — Other Ambulatory Visit (HOSPITAL_COMMUNITY): Payer: Self-pay

## 2023-04-18 DIAGNOSIS — M6281 Muscle weakness (generalized): Secondary | ICD-10-CM | POA: Diagnosis not present

## 2023-04-18 DIAGNOSIS — R252 Cramp and spasm: Secondary | ICD-10-CM | POA: Diagnosis not present

## 2023-04-18 DIAGNOSIS — G8112 Spastic hemiplegia affecting left dominant side: Secondary | ICD-10-CM | POA: Diagnosis not present

## 2023-04-18 DIAGNOSIS — R262 Difficulty in walking, not elsewhere classified: Secondary | ICD-10-CM | POA: Diagnosis not present

## 2023-04-19 ENCOUNTER — Other Ambulatory Visit: Payer: Self-pay | Admitting: Cardiology

## 2023-04-25 DIAGNOSIS — G8112 Spastic hemiplegia affecting left dominant side: Secondary | ICD-10-CM | POA: Diagnosis not present

## 2023-04-25 DIAGNOSIS — R262 Difficulty in walking, not elsewhere classified: Secondary | ICD-10-CM | POA: Diagnosis not present

## 2023-04-25 DIAGNOSIS — M6281 Muscle weakness (generalized): Secondary | ICD-10-CM | POA: Diagnosis not present

## 2023-04-25 DIAGNOSIS — R252 Cramp and spasm: Secondary | ICD-10-CM | POA: Diagnosis not present

## 2023-05-02 DIAGNOSIS — R262 Difficulty in walking, not elsewhere classified: Secondary | ICD-10-CM | POA: Diagnosis not present

## 2023-05-02 DIAGNOSIS — M6281 Muscle weakness (generalized): Secondary | ICD-10-CM | POA: Diagnosis not present

## 2023-05-02 DIAGNOSIS — R252 Cramp and spasm: Secondary | ICD-10-CM | POA: Diagnosis not present

## 2023-05-02 DIAGNOSIS — G8112 Spastic hemiplegia affecting left dominant side: Secondary | ICD-10-CM | POA: Diagnosis not present

## 2023-05-08 ENCOUNTER — Other Ambulatory Visit: Payer: Self-pay | Admitting: Medical

## 2023-05-09 ENCOUNTER — Other Ambulatory Visit: Payer: Self-pay | Admitting: Medical

## 2023-05-09 DIAGNOSIS — R252 Cramp and spasm: Secondary | ICD-10-CM | POA: Diagnosis not present

## 2023-05-09 DIAGNOSIS — M6281 Muscle weakness (generalized): Secondary | ICD-10-CM | POA: Diagnosis not present

## 2023-05-09 DIAGNOSIS — G8112 Spastic hemiplegia affecting left dominant side: Secondary | ICD-10-CM | POA: Diagnosis not present

## 2023-05-09 DIAGNOSIS — R262 Difficulty in walking, not elsewhere classified: Secondary | ICD-10-CM | POA: Diagnosis not present

## 2023-05-09 MED ORDER — BACLOFEN 10 MG PO TABS: 10.0000 mg | ORAL_TABLET | Freq: Three times a day (TID) | ORAL | 5 refills | Status: DC

## 2023-05-12 ENCOUNTER — Other Ambulatory Visit: Payer: Self-pay | Admitting: Adult Health

## 2023-05-12 ENCOUNTER — Encounter: Payer: Self-pay | Admitting: Internal Medicine

## 2023-05-12 ENCOUNTER — Other Ambulatory Visit: Payer: Self-pay | Admitting: Medical

## 2023-05-15 NOTE — Telephone Encounter (Signed)
 Pt picked up 1st rx on 05/10/23 but pharmacy has scheduled other refills. Sending to [facility]

## 2023-05-16 DIAGNOSIS — R252 Cramp and spasm: Secondary | ICD-10-CM | POA: Diagnosis not present

## 2023-05-16 DIAGNOSIS — M6281 Muscle weakness (generalized): Secondary | ICD-10-CM | POA: Diagnosis not present

## 2023-05-16 DIAGNOSIS — G8112 Spastic hemiplegia affecting left dominant side: Secondary | ICD-10-CM | POA: Diagnosis not present

## 2023-05-16 DIAGNOSIS — R262 Difficulty in walking, not elsewhere classified: Secondary | ICD-10-CM | POA: Diagnosis not present

## 2023-05-23 DIAGNOSIS — R262 Difficulty in walking, not elsewhere classified: Secondary | ICD-10-CM | POA: Diagnosis not present

## 2023-05-23 DIAGNOSIS — M6281 Muscle weakness (generalized): Secondary | ICD-10-CM | POA: Diagnosis not present

## 2023-05-23 DIAGNOSIS — G8112 Spastic hemiplegia affecting left dominant side: Secondary | ICD-10-CM | POA: Diagnosis not present

## 2023-05-23 DIAGNOSIS — R252 Cramp and spasm: Secondary | ICD-10-CM | POA: Diagnosis not present

## 2023-05-24 ENCOUNTER — Other Ambulatory Visit: Payer: Self-pay | Admitting: Cardiology

## 2023-05-24 DIAGNOSIS — I10 Essential (primary) hypertension: Secondary | ICD-10-CM

## 2023-05-30 ENCOUNTER — Telehealth: Payer: Self-pay | Admitting: Internal Medicine

## 2023-05-30 DIAGNOSIS — R262 Difficulty in walking, not elsewhere classified: Secondary | ICD-10-CM | POA: Diagnosis not present

## 2023-05-30 DIAGNOSIS — R252 Cramp and spasm: Secondary | ICD-10-CM | POA: Diagnosis not present

## 2023-05-30 DIAGNOSIS — G8112 Spastic hemiplegia affecting left dominant side: Secondary | ICD-10-CM | POA: Diagnosis not present

## 2023-05-30 DIAGNOSIS — M6281 Muscle weakness (generalized): Secondary | ICD-10-CM | POA: Diagnosis not present

## 2023-05-30 NOTE — Telephone Encounter (Signed)
 I have sent this to mercedes as well

## 2023-05-30 NOTE — Telephone Encounter (Signed)
 From Wife Terrea Ferrier,  Hello Jimmye Moulds ! I hope you are doing! We already scheduled a visit for Shloma with you , so you could check on his toenail, But I'm wringer about something else that is very important; I need to go to see my dad in [place], he's nos going well and I would like to see him again . But Najeeb cannot come with me , and on the other hand I cannot leave him on his own like before . It will be just for 4 days, just to see and hug my dad and come back .  But I need to put Sujay on a temporary place , or find someone to stay here with him .  Maybe not 24/7 but he still needs someone to be with him. What do you recommend? What would it be better to do? I really need you advice  Is there a nice place that you would recommend to leave him for a few days? Or an agency for home care?  Tag of course prefers to stay home , if possible. Thank you so much !!  Blessings! We'll see you on June 2 nd !  Happy Memorial Day!!  Terrea Ferrier

## 2023-05-31 ENCOUNTER — Other Ambulatory Visit: Payer: Self-pay | Admitting: Medical

## 2023-06-01 DIAGNOSIS — D485 Neoplasm of uncertain behavior of skin: Secondary | ICD-10-CM | POA: Diagnosis not present

## 2023-06-01 DIAGNOSIS — L219 Seborrheic dermatitis, unspecified: Secondary | ICD-10-CM | POA: Diagnosis not present

## 2023-06-01 DIAGNOSIS — L723 Sebaceous cyst: Secondary | ICD-10-CM | POA: Diagnosis not present

## 2023-06-01 DIAGNOSIS — D225 Melanocytic nevi of trunk: Secondary | ICD-10-CM | POA: Diagnosis not present

## 2023-06-05 ENCOUNTER — Encounter: Payer: Self-pay | Admitting: Medical

## 2023-06-05 ENCOUNTER — Ambulatory Visit (INDEPENDENT_AMBULATORY_CARE_PROVIDER_SITE_OTHER): Admitting: Medical

## 2023-06-05 VITALS — BP 130/68 | HR 72

## 2023-06-05 DIAGNOSIS — R351 Nocturia: Secondary | ICD-10-CM

## 2023-06-05 DIAGNOSIS — R350 Frequency of micturition: Secondary | ICD-10-CM

## 2023-06-05 DIAGNOSIS — Q845 Enlarged and hypertrophic nails: Secondary | ICD-10-CM | POA: Diagnosis not present

## 2023-06-05 DIAGNOSIS — B351 Tinea unguium: Secondary | ICD-10-CM | POA: Diagnosis not present

## 2023-06-05 DIAGNOSIS — F419 Anxiety disorder, unspecified: Secondary | ICD-10-CM

## 2023-06-05 LAB — POCT URINALYSIS DIP (PROADVANTAGE DEVICE)
Bilirubin, UA: NEGATIVE
Blood, UA: NEGATIVE
Glucose, UA: NEGATIVE mg/dL
Ketones, POC UA: NEGATIVE mg/dL
Leukocytes, UA: NEGATIVE
Nitrite, UA: NEGATIVE
Protein Ur, POC: 30 mg/dL — AB
Specific Gravity, Urine: 1.01
Urobilinogen, Ur: NEGATIVE
pH, UA: 6 (ref 5.0–8.0)

## 2023-06-05 MED ORDER — TAMSULOSIN HCL 0.4 MG PO CAPS
0.4000 mg | ORAL_CAPSULE | Freq: Every day | ORAL | 2 refills | Status: DC
Start: 2023-06-05 — End: 2023-08-07

## 2023-06-05 MED ORDER — LORAZEPAM 0.5 MG PO TABS: 0.5000 mg | ORAL_TABLET | Freq: Two times a day (BID) | ORAL | 1 refills | Status: AC | PRN

## 2023-06-05 MED ORDER — TERBINAFINE HCL 250 MG PO TABS: 250.0000 mg | ORAL_TABLET | Freq: Every day | ORAL | 1 refills | Status: DC

## 2023-06-05 NOTE — Addendum Note (Signed)
 Addended by: Charliene Conte A on: 06/05/2023 02:49 PM   Modules accepted: Orders

## 2023-06-05 NOTE — Progress Notes (Signed)
 Subjective:  Steve Burton is a 68 y.o. male who presents for Chief Complaint  Patient presents with   other    F/U on left foot big toenail-growing slow and has turned green, frequent urination issues, asking about flomax      Here with wife for concerns  Having thickened left great toe nail for years.   Went to podiatry in 2022, had part of nail removed at that time,  but over time it has grown back in thick.  Now is discolored.   No pain.  Wife has hard time trimming the nails given the thickness.   Lately is urinating a lot.   Not overly thirsty ut urinates somewhat often.  Gets up 5-6 times at night to urinate.   No fever, no blood in urine, no pain with urination.    Has been a little anxious of late as wife needs to go back to her home land soon for sick relative.  He has been anxious about this.  He needs refill on his lorazepam .  No other aggravating or relieving factors.    No other c/o.  Past Medical History:  Diagnosis Date   Elevated LDL cholesterol level    Elevated PSA measurement 11/22/2016   Hyperlipidemia    Hypertension    Impaired fasting glucose 11/22/2016   Left hemiparesis (HCC)    Spastic hemiparesis (HCC)    Stroke [facility]) 2011   Current Outpatient Medications on File Prior to Visit  Medication Sig Dispense Refill   acetaminophen  (TYLENOL ) 500 MG tablet Take 500 mg by mouth every 6 (six) hours as needed for mild pain (pain score 1-3).     aspirin  EC 81 MG tablet Take 1 tablet (81 mg total) by mouth daily. Swallow whole. 90 tablet 3   atorvastatin  (LIPITOR) 10 MG tablet TAKE 1 TABLET BY MOUTH DAILY 100 tablet 1   baclofen  (LIORESAL ) 10 MG tablet TAKE 1 TABLET BY MOUTH 3 TIMES  DAILY 270 tablet 1   diltiazem  (CARDIZEM  CD) 240 MG 24 hr capsule TAKE 1 CAPSULE BY MOUTH DAILY 90 capsule 3   hydrALAZINE  (APRESOLINE ) 50 MG tablet TAKE 1 TABLET BY MOUTH 3 TIMES  DAILY 300 tablet 2   labetalol  (NORMODYNE ) 100 MG tablet TAKE 1 TABLET BY MOUTH TWICE  DAILY 200 tablet 3    levETIRAcetam  (KEPPRA ) 500 MG tablet TAKE 1 TABLET BY MOUTH TWICE  DAILY 200 tablet 2   losartan  (COZAAR ) 100 MG tablet TAKE 1 TABLET BY MOUTH DAILY 100 tablet 2   potassium chloride  SA (KLOR-CON  M) 20 MEQ tablet TAKE 1 TABLET BY MOUTH TWICE  DAILY 200 tablet 2   spironolactone  (ALDACTONE ) 25 MG tablet Take 1 tablet (25 mg total) by mouth daily. 90 tablet 0   No current facility-administered medications on file prior to visit.     The following portions of the patient's history were reviewed and updated as appropriate: allergies, current medications, past family history, past medical history, past social history, past surgical history and problem list.  ROS Otherwise as in subjective above    Objective: BP 130/68   Pulse 72   SpO2 98%   General appearance: alert, no distress, well developed, well nourished Left great toenail thickened and greenish tint.  Other nails somewhat hypertrophied as well.  Feet neurovascularly intact    Assessment: Encounter Diagnoses  Name Primary?   Enlarged and hypertrophic nails Yes   Onychomycosis    Nocturia    Urinary frequency    Anxiety  Plan: Enlarged nails - referral back to podiatry  Onychomycosis - discussed findings, possible treatment options.  Begin trial of oral Lamisil.  Discussed risk/benefits of medication.   Follow up in 1 month  Nocturia, urinary frequency - urinary reviewed.  Begin trial of flomax  for likely BPH related urine changes  Anxiety - refilled lorazepam ,discussed ways to deal with anxiety, and consider SSRI.  Steve Burton was seen today for other.  Diagnoses and all orders for this visit:  Enlarged and hypertrophic nails -     Ambulatory referral to Podiatry  Onychomycosis -     Ambulatory referral to Podiatry  Nocturia  Urinary frequency  Anxiety  Other orders -     terbinafine (LAMISIL) 250 MG tablet; Take 1 tablet (250 mg total) by mouth daily. -     LORazepam  (ATIVAN ) 0.5 MG tablet; Take 1  tablet (0.5 mg total) by mouth 2 (two) times daily as needed for anxiety. -     tamsulosin  (FLOMAX ) 0.4 MG CAPS capsule; Take 1 capsule (0.4 mg total) by mouth daily.    Follow up: 77mo

## 2023-06-06 DIAGNOSIS — G8112 Spastic hemiplegia affecting left dominant side: Secondary | ICD-10-CM | POA: Diagnosis not present

## 2023-06-06 DIAGNOSIS — R252 Cramp and spasm: Secondary | ICD-10-CM | POA: Diagnosis not present

## 2023-06-06 DIAGNOSIS — R262 Difficulty in walking, not elsewhere classified: Secondary | ICD-10-CM | POA: Diagnosis not present

## 2023-06-06 DIAGNOSIS — M6281 Muscle weakness (generalized): Secondary | ICD-10-CM | POA: Diagnosis not present

## 2023-06-13 DIAGNOSIS — L720 Epidermal cyst: Secondary | ICD-10-CM | POA: Diagnosis not present

## 2023-06-13 DIAGNOSIS — D485 Neoplasm of uncertain behavior of skin: Secondary | ICD-10-CM | POA: Diagnosis not present

## 2023-06-13 DIAGNOSIS — L728 Other follicular cysts of the skin and subcutaneous tissue: Secondary | ICD-10-CM | POA: Diagnosis not present

## 2023-06-19 ENCOUNTER — Ambulatory Visit: Admitting: Podiatry

## 2023-06-22 ENCOUNTER — Other Ambulatory Visit: Payer: Self-pay | Admitting: Cardiology

## 2023-06-26 ENCOUNTER — Other Ambulatory Visit: Payer: Self-pay | Admitting: Medical

## 2023-06-27 DIAGNOSIS — D485 Neoplasm of uncertain behavior of skin: Secondary | ICD-10-CM | POA: Diagnosis not present

## 2023-06-27 DIAGNOSIS — L905 Scar conditions and fibrosis of skin: Secondary | ICD-10-CM | POA: Diagnosis not present

## 2023-06-29 ENCOUNTER — Telehealth: Payer: Self-pay | Admitting: Medical

## 2023-06-29 DIAGNOSIS — I10 Essential (primary) hypertension: Secondary | ICD-10-CM

## 2023-06-29 NOTE — Telephone Encounter (Signed)
 Copied from CRM [id number]. Topic: Clinical - Medication Refill >> Jun 29, 2023  1:11 PM Fredrica W wrote: Medication: labetalol  (NORMODYNE ) 100 MG tablet  Has the patient contacted their pharmacy? Yes (Agent: If no, request that the patient contact the pharmacy for the refill. If patient does not wish to contact the pharmacy document the reason why and proceed with request.) (Agent: If yes, when and what did the pharmacy advise?) time to renew - need provider renewal  This is the patient's preferred pharmacy:  [facility] - [place], [place] - [address] [address] [place] [address] [place] [address] Phone: [phone] Fax: [phone]  Is this the correct pharmacy for this prescription? Yes If no, delete pharmacy and type the correct one.   Has the prescription been filled recently? No - last prescribed by Dr Pietro , pt states they no longer see Dr Pietro, thought pcp would be prescribing. Thank You   Is the patient out of the medication? No  Has the patient been seen for an appointment in the last year OR does the patient have an upcoming appointment? Yes  Can we respond through MyChart? No  Agent: Please be advised that Rx refills may take up to 3 business days. We ask that you follow-up with your pharmacy.

## 2023-07-03 ENCOUNTER — Ambulatory Visit: Admitting: Podiatry

## 2023-07-03 ENCOUNTER — Encounter: Payer: Self-pay | Admitting: Podiatry

## 2023-07-03 DIAGNOSIS — L603 Nail dystrophy: Secondary | ICD-10-CM | POA: Diagnosis not present

## 2023-07-03 DIAGNOSIS — B351 Tinea unguium: Secondary | ICD-10-CM | POA: Diagnosis not present

## 2023-07-03 NOTE — Progress Notes (Signed)
  Subjective:  Patient ID: Steve Burton, male    DOB: [birth year],   MRN: [id number]  Chief Complaint  Patient presents with   Nail Problem    Enlarged and hypertrophic nails,Onychomycosis. Finished last pill on first round of Lamisil   yesterday.    68 y.o. male presents for concern of left great toenail changes. Relates a while back he banged his toe getting in to the shower. He had bleeding underneath the nail and never grew back right. Recently it had been turning green. He was prescribed one month of lamisil  by his PCP that he just finished yesterday . Denies any other pedal complaints. Denies n/v/f/c.   Past Medical History:  Diagnosis Date   Elevated LDL cholesterol level    Elevated PSA measurement 11/22/2016   Hyperlipidemia    Hypertension    Impaired fasting glucose 11/22/2016   Left hemiparesis (HCC)    Spastic hemiparesis (HCC)    Stroke (HCC) 2011    Objective:  Physical Exam: Vascular: DP/PT pulses 2/4 bilateral. CFT <3 seconds. Normal hair growth on digits. No edema.  Skin. No lacerations or abrasions bilateral feet. Right hallux nail thickened and dystrophic with subungual debris noted  Musculoskeletal: MMT 5/5 bilateral lower extremities in DF, PF, Inversion and Eversion. Deceased ROM in DF of ankle joint.  Neurological: Sensation intact to light touch.   Assessment:   1. Onychomycosis      Plan:  Patient was evaluated and treated and all questions answered. -Examined patient -Discussed treatment options for painful dystrophic nails  -Clinical picture and Fungal culture was obtained by removing a portion of the hard nail itself from each of the involved toenails using a sterile nail nipper and sent to [facility] lab. Patient tolerated the biopsy procedure well without discomfort or need for anesthesia.  -Discussed fungal nail treatment options including oral, topical, and laser treatments.  -Patient to return in 4 weeks for follow up evaluation and discussion of  fungal culture results or sooner if symptoms worsen.   Asberry Failing, DPM

## 2023-07-03 NOTE — Addendum Note (Signed)
 Addended by: GERRIT ANDREZ CROME on: 07/03/2023 03:19 PM   Modules accepted: Orders

## 2023-07-06 ENCOUNTER — Other Ambulatory Visit: Payer: Self-pay | Admitting: Medical

## 2023-07-06 DIAGNOSIS — I10 Essential (primary) hypertension: Secondary | ICD-10-CM

## 2023-07-06 MED ORDER — LABETALOL HCL 100 MG PO TABS: 100.0000 mg | ORAL_TABLET | Freq: Two times a day (BID) | ORAL | 0 refills | Status: DC

## 2023-07-11 ENCOUNTER — Ambulatory Visit: Payer: Medicare Other | Admitting: Adult Health

## 2023-07-11 ENCOUNTER — Encounter: Payer: Self-pay | Admitting: Adult Health

## 2023-07-11 VITALS — BP 117/79 | HR 89 | Ht 69.0 in | Wt 166.0 lb

## 2023-07-11 DIAGNOSIS — I69398 Other sequelae of cerebral infarction: Secondary | ICD-10-CM

## 2023-07-11 DIAGNOSIS — I639 Cerebral infarction, unspecified: Secondary | ICD-10-CM | POA: Diagnosis not present

## 2023-07-11 DIAGNOSIS — I610 Nontraumatic intracerebral hemorrhage in hemisphere, subcortical: Secondary | ICD-10-CM

## 2023-07-11 DIAGNOSIS — R569 Unspecified convulsions: Secondary | ICD-10-CM

## 2023-07-11 MED ORDER — BACLOFEN 10 MG PO TABS: 10.0000 mg | ORAL_TABLET | Freq: Four times a day (QID) | ORAL | 3 refills | Status: AC

## 2023-07-11 MED ORDER — LEVETIRACETAM 500 MG PO TABS: 500.0000 mg | ORAL_TABLET | Freq: Two times a day (BID) | ORAL | 3 refills | Status: AC

## 2023-07-11 NOTE — Progress Notes (Signed)
 [facility] [address] [place]. [place] [address] [phone]       OFFICE FOLLOW UP NOTE  Steve Burton Date of Birth:  1955-05-29 Medical Record Number:  [id number]   [facility] provider: Dr. Rosemarie Referring MD: Steve Burton Reason for Referral: Seizure, hx of stroke     CC: Seizure Chief Complaint  Patient presents with   Seizures    Rm 3 with spouse  Pt is well and stable,  reports no sz or new concerns since last visit.     HPI:   Update 07/11/2023 JM: Patient returns for yearly seizure follow-up accompanied by his wife.  Overall has been stable without any recurrent seizure activity.  Reports compliance on Keppra  500 mg twice daily, denies side effects.  No new stroke/TIA symptoms.  Chronic left spastic hemiparesis stable, remains w/c bound. Was previously working with PT and working on standing/walking but additional sessions denied by insurance. Wife continues to try to assist with exercises at home. He remains on baclofen  10mg  TID for spasticity without side effects, does report this interfering with PT sessions, does have spasms easily when stretching arm/leg which can be bothersome.  Routinely follows with PCP for stroke risk factor management.     History provided for reference purposes only Update 07/11/2022 JM: Returns for yearly seizure follow-up.  Denies any seizure activity.  Reports compliance on Keppra  500 mg twice daily, tolerating well.  Repeat EEG 05/2021 normal.  He was hospitalized back in November for COVID-19 infection with acute hypoxic respiratory failure and acute metabolic encephalopathy and was found to have acute infarct in left frontal white matter which was felt due to small vessel disease in setting of COVID. Due to prior hx of ICH, recommended continuation of aspirin  and statin for secondary prevention. He did have some generalized weakness after d/c but this has greatly improved after working with PT and now currently at baseline  with chronic left spastic hemiparesis from prior stroke.  Denies any residual deficits from recent stroke.  Denies any new or worsening stroke/TIA symptoms since that time.  Currently on baclofen  for spasticity managed by PCP.  Continues on aspirin  and atorvastatin .  Blood pressure well-controlled.  Routinely follows with PCP for stroke risk factor management.  He does report occasionally driving short distance (<9.4 miles) always accompanied by his wife only during the day when light traffic times, avoids driving at night and bad weather. He has not had any difficulty with this nor does wife have any concerns with this.   Update 01/27/2021 JM: Returns for 1 year seizure follow-up.  Overall stable without seizure activity.  Compliant on Keppra  500 mg twice daily without side effects. He does question possibly coming off Keppra  as seizure free for almost 2 years.  History of prior stroke with residual left spastic hemiparesis stable, denies new stroke/TIA symptoms.  Routinely follows with PCP.  No new concerns at this time.  Update 01/28/2020 JM: Steve Burton returns for 66-month seizure follow-up accompanied by his wife.  Doing well since prior visit without any additional seizures and remains on Keppra  500 mg twice daily without side effects.  Post stroke left spastic hemiparesis stable without worsening and denies new stroke/TIA symptoms.  Continues to ambulate with a cane and denies any recent falls.  He exercises routinely and goes to the gym frequently.  No concerns at this time.  Update 07/25/2019 JM: Steve Burton returns for seizure follow-up accompanied by his wife Steve Burton.  He has been stable since  prior visit without recurrent seizure activity or episodes.  Remains on Keppra  500 mg twice daily tolerating well.  Repeat EEG 05/20/2019 showed focal right hemispheric slowing likely related to sequela of prior stroke without definite seizure activity noted.  Remote history of right BG ICH in 2011 with residual  spastic left hemiparesis which has been stable. Denies new stroke/TIA symptoms.  Blood pressure today 127/76.  No concerns at this time.  Initial visit 04/24/2019 Dr. Rosemarie: Steve Burton is a 68 year old Caucasian male seen today for initial office consultation visit for seizure.  He is accompanied by his wife.  History is obtained from them, review of ER electronic medical records and I personally reviewed available imaging films in PACS.  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  He had a witnessed episode of seizure on 04/20/2019.  Patient was sitting on a computer listening to music concert when his wife was in the next room heard a loud sound and when she arrived she found him lying on the floor leaning to 1 side and having jerking movements.  His eyes were partially open but he had a glazed look and was unresponsive.  He had a tongue bite and he also had urinary incontinence.  She called EMS patient remained unresponsive while they arrived and did not regain consciousness till he reached the hospital.  He subsequently gradually returned back to his baseline. MRI scan of the brain was obtained on 04/20/2019 which showed no acute abnormality showed large area of encephalomalacia in the right basal ganglia extending up into the corona radiata with gliosis.  MRI of the brain showed no large vessel stenosis or occlusion.  Urine analysis was negative.  Basic metabolic panel labs unremarkable.  CBC and creatinine kinase were also normal.  Urine drug screen was negative.  Patient was given IV Keppra  and discharged home with Keppra  500 mg twice daily.  Patient states he is taken it for last 3 days he had a episode of emotional lability and cried yesterday but feels fine now.  He denies dizziness or other side effects.  I do not have access to the patient's previous admission in 2011 for intracerebral  hemorrhage with apparently I saw the patient at that time.  The wife feels that he may have had a seizure at that time but was not discharged on any anticonvulsants.  He has no other history of hyper of head injury with loss of consciousness or seizures.  His blood pressure has apparently been well controlled and he takes labetalol  , Cozaar  and Aldactone .  Today blood pressure is acceptable at 136/79 .  Patient denies any office triggers for his seizures in the form of fever, infection, dehydration or sleep deprivation or alcohol use.  He has significant residual spastic left hemiplegia from his address of the hemorrhage in 2011 and is able to walk with a hemiwalker but does not have much use of his left arm.  He has a left foot drop.  His wife still keeps a close watch on him but is able to do most activities for himself.  ROS:   14 system review of systems is positive for those listed in HPI and all other systems negative  PMH:  Past Medical History:  Diagnosis Date   Elevated LDL cholesterol level    Elevated PSA measurement 11/22/2016   Hyperlipidemia    Hypertension    Impaired fasting glucose  11/22/2016   Left hemiparesis (HCC)    Spastic hemiparesis (HCC)    Stroke [facility]) 2011    Social History:  Social History   Socioeconomic History   Marital status: Married    Spouse name: Not on file   Number of children: Not on file   Years of education: Not on file   Highest education level: Master's degree (e.g., MA, MS, MEng, MEd, MSW, MBA)  Occupational History   Not on file  Tobacco Use   Smoking status: Never   Smokeless tobacco: Never  Substance and Sexual Activity   Alcohol use: Yes    Alcohol/week: 0.0 standard drinks of alcohol    Comment: 1 glass of wine per week.    Drug use: No   Sexual activity: Not on file  Other Topics Concern   Not on file  Social History Narrative   Married, walking for exercise some. Uses cane and wheelchair.  Hobbies -  piano.  09/2021   Social  Drivers of Health   Financial Resource Strain: Low Risk  (06/02/2023)   Overall Financial Resource Strain (CARDIA)    Difficulty of Paying Living Expenses: Not hard at all  Food Insecurity: No Food Insecurity (07/08/2023)   Hunger Vital Sign    Worried About Running Out of Food in the Last Year: Never true    Ran Out of Food in the Last Year: Never true  Transportation Needs: No Transportation Needs (07/08/2023)   PRAPARE - Administrator, Civil Service (Medical): No    Lack of Transportation (Non-Medical): No  Physical Activity: Unknown (07/08/2023)   Exercise Vital Sign    Days of Exercise per Week: Patient declined    Minutes of Exercise per Session: Not on file  Stress: No Stress Concern Present (07/08/2023)   [employer] of Occupational Health - Occupational Stress Questionnaire    Feeling of Stress: Not at all  Social Connections: Socially Isolated (07/08/2023)   Social Connection and Isolation Panel    Frequency of Communication with Friends and Family: Never    Frequency of Social Gatherings with Friends and Family: Never    Attends Religious Services: Never    Database administrator or Organizations: No    Attends Engineer, structural: Not on file    Marital Status: Married  Catering manager Violence: Not on file    Medications:   Current Outpatient Medications on File Prior to Visit  Medication Sig Dispense Refill   aspirin  EC 81 MG tablet Take 1 tablet (81 mg total) by mouth daily. Swallow whole. 90 tablet 3   atorvastatin  (LIPITOR) 10 MG tablet TAKE 1 TABLET BY MOUTH DAILY 100 tablet 1   baclofen  (LIORESAL ) 10 MG tablet TAKE 1 TABLET BY MOUTH 3 TIMES  DAILY 270 tablet 1   diltiazem  (CARDIZEM  CD) 240 MG 24 hr capsule TAKE 1 CAPSULE BY MOUTH DAILY 90 capsule 3   hydrALAZINE  (APRESOLINE ) 50 MG tablet TAKE 1 TABLET BY MOUTH 3 TIMES  DAILY 300 tablet 2   labetalol  (NORMODYNE ) 100 MG tablet Take 1 tablet (100 mg total) by mouth 2 (two) times daily. 200  tablet 0   levETIRAcetam  (KEPPRA ) 500 MG tablet TAKE 1 TABLET BY MOUTH TWICE  DAILY 200 tablet 2   LORazepam  (ATIVAN ) 0.5 MG tablet Take 1 tablet (0.5 mg total) by mouth 2 (two) times daily as needed for anxiety. 45 tablet 1   losartan  (COZAAR ) 100 MG tablet TAKE 1 TABLET BY MOUTH DAILY 100 tablet  2   potassium chloride  SA (KLOR-CON  M) 20 MEQ tablet TAKE 1 TABLET BY MOUTH TWICE  DAILY 200 tablet 2   spironolactone  (ALDACTONE ) 25 MG tablet Take 1 tablet (25 mg total) by mouth daily. 90 tablet 0   tamsulosin  (FLOMAX ) 0.4 MG CAPS capsule Take 1 capsule (0.4 mg total) by mouth daily. 30 capsule 2   No current facility-administered medications on file prior to visit.    Allergies:   Allergies  Allergen Reactions   Phenytoin  Sodium Extended Swelling and Other (See Comments)    Dilantin - Arm swelling    Penicillins Rash    Has patient had a PCN reaction causing immediate rash, facial/tongue/throat swelling, SOB or lightheadedness with hypotension: Unknown Has patient had a PCN reaction causing severe rash involving mucus membranes or skin necrosis: Unk Has patient had a PCN reaction that required hospitalization: Unk Has patient had a PCN reaction occurring within the last 10 years: Yes If all of the above answers are NO, then may proceed with Cephalosporin use.      Today's Vitals   07/11/23 1442  BP: 117/79  Pulse: 89  Weight: 166 lb (75.3 kg)  Height: 5' 9 (1.753 m)   Body mass index is 24.51 kg/m.  Physical Exam General: well developed, well nourished very pleasant middle-aged Caucasian male, seated, in no evident distress  Neurologic Exam Mental Status: Awake and fully alert.  Unable to appreciate aphasia or dysarthria but does have paucity of speech.  Oriented to place and time. Recent and remote memory intact. Attention span, concentration and fund of knowledge appropriate. Mood and affect appropriate.  Cranial Nerves: Pupils equal, briskly reactive to light. Extraocular  movements full without nystagmus. Visual fields full to confrontation. Hearing intact. Facial sensation intact.  Mild left lower facial asymmetry when he smiles only.  Tongue, palate moves normally and symmetrically.  Motor: Spastic left hemiplegia chronic, episodes of clonus with reflex testing in RUE and outstretched arm; full strength RUE and RLE Sensory.:  Diminished left hemibody touch , pinprick , position and vibratory sensation.  Coordination: Rapid alternating movements normal on right side. Finger-to-nose and heel-to-shin performed accurately on right side. Gait and Station: deferred. Use of w/c today Reflexes: 3+ on the left, 1+ right. Toes downgoing.        ASSESSMENT/PLAN: 68 year old Caucasian male with solitary episode of weakness partial onset seizures with secondary generalization on 04/19/2019 likely symptomatic from remote hypertensive right basal ganglia intracerebral hemorrhage in 2011 with residual left spastic hemiplegia. Recent evidence of left frontal semiovale infarct in 11/2021 likely secondary to small vessel disease in setting of COVID without residual deficit.   1.  Seizure, late effect of stroke -Denies recurrent seizure activity or episode -Continue Keppra  500 mg twice daily for seizure prophylaxis -refill provided -EEG 05/2021 normal -Patient not interested in decreasing dosage at this time   2. Hx of R BG ICH 03/2009 3. 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 Due to spasticity interfering with therapy, recommend he continue with baclofen  10 mg 3 times daily with additional 10mg  tablet as needed. If needed, could also consider increasing TID dosing to 15mg  tablets. Discussed potential side effects.  -Continue to follow with PCP regularly for continued secondary stroke prevention management    Follow-up in 1 year or call earlier  if needed    CC:  Tysinger, Alm Steve Burton    I personally  spent a total of 30 minutes in the care of the patient today including preparing to see the patient, performing a medically appropriate exam/evaluation, counseling and educating, placing orders, and documenting clinical information in the EHR.  Harlene Bogaert, AGNP-BC  [facility] [address] [place], [place] [address]  Phone [phone] Fax [phone] Note: This document was prepared with digital dictation and possible smart phrase technology. Any transcriptional errors that result from this process are unintentional.

## 2023-07-11 NOTE — Patient Instructions (Addendum)
 Your Plan:  Continue Keppra  500 mg twice daily for seizure prevention  Continue to follow with your PCP for stroke risk factor management  Please call with any recurrent seizure activity  Continue baclofen  10mg  three times daily and use extra 10mg  tablet as needed  Would look into working with [employer] for additional therapy sessions       Follow-up in 1 year or call earlier if needed      Thank you for coming to see us  at [facility]. I hope we have been able to provide you high quality care today.  You may receive a patient satisfaction survey over the next few weeks. We would appreciate your feedback and comments so that we may continue to improve ourselves and the health of our patients.

## 2023-07-12 ENCOUNTER — Ambulatory Visit: Admitting: Medical

## 2023-07-12 VITALS — BP 130/80 | HR 62 | Wt 156.4 lb

## 2023-07-12 DIAGNOSIS — Z79899 Other long term (current) drug therapy: Secondary | ICD-10-CM | POA: Diagnosis not present

## 2023-07-12 DIAGNOSIS — E785 Hyperlipidemia, unspecified: Secondary | ICD-10-CM | POA: Diagnosis not present

## 2023-07-12 DIAGNOSIS — I10 Essential (primary) hypertension: Secondary | ICD-10-CM | POA: Diagnosis not present

## 2023-07-12 DIAGNOSIS — L608 Other nail disorders: Secondary | ICD-10-CM

## 2023-07-12 NOTE — Progress Notes (Signed)
 Subjective:  Steve Burton is a 68 y.o. male who presents for Chief Complaint  Patient presents with   Follow-up    1 month follow-up on liver function and medication. Went to podiatry on 6/30 and had fungus collected and it would take a long time to come back      Here for follow up.   We had recently begun trial of Lamisil  for left great toe possible fungus, thickening and achiness.  Been using Lamisil  daily for past 30 days.  In the meantime has seen podiatry, had nail culture. This will take several weeks to result .  He will hold off on Lamisil  for now until results.  Here for lab for surveillance of liver/kidney since he started Lamisil  a month ago.  Urine frequency symptoms have been doing better on Flomax  started recently  He has hx/o stroke and hemiparesis.   Was doing PT since May but recently therapy told him insurance wasn't going to continue covering this.  He is not sure why.   No other aggravating or relieving factors.    No other c/o.  Past Medical History:  Diagnosis Date   Elevated LDL cholesterol level    Elevated PSA measurement 11/22/2016   Hyperlipidemia    Hypertension    Impaired fasting glucose 11/22/2016   Left hemiparesis (HCC)    Spastic hemiparesis (HCC)    Stroke (HCC) 2011   Current Outpatient Medications on File Prior to Visit  Medication Sig Dispense Refill   aspirin  EC 81 MG tablet Take 1 tablet (81 mg total) by mouth daily. Swallow whole. 90 tablet 3   atorvastatin  (LIPITOR) 10 MG tablet TAKE 1 TABLET BY MOUTH DAILY 100 tablet 1   baclofen  (LIORESAL ) 10 MG tablet Take 1 tablet (10 mg total) by mouth 4 (four) times daily. 400 tablet 3   diltiazem  (CARDIZEM  CD) 240 MG 24 hr capsule TAKE 1 CAPSULE BY MOUTH DAILY 90 capsule 3   hydrALAZINE  (APRESOLINE ) 50 MG tablet TAKE 1 TABLET BY MOUTH 3 TIMES  DAILY 300 tablet 2   labetalol  (NORMODYNE ) 100 MG tablet Take 1 tablet (100 mg total) by mouth 2 (two) times daily. 200 tablet 0   levETIRAcetam  (KEPPRA ) 500  MG tablet Take 1 tablet (500 mg total) by mouth 2 (two) times daily. 200 tablet 3   LORazepam  (ATIVAN ) 0.5 MG tablet Take 1 tablet (0.5 mg total) by mouth 2 (two) times daily as needed for anxiety. 45 tablet 1   losartan  (COZAAR ) 100 MG tablet TAKE 1 TABLET BY MOUTH DAILY 100 tablet 2   potassium chloride  SA (KLOR-CON  M) 20 MEQ tablet TAKE 1 TABLET BY MOUTH TWICE  DAILY 200 tablet 2   spironolactone  (ALDACTONE ) 25 MG tablet Take 1 tablet (25 mg total) by mouth daily. 90 tablet 0   tamsulosin  (FLOMAX ) 0.4 MG CAPS capsule Take 1 capsule (0.4 mg total) by mouth daily. 30 capsule 2   No current facility-administered medications on file prior to visit.     The following portions of the patient's history were reviewed and updated as appropriate: allergies, current medications, past family history, past medical history, past social history, past surgical history and problem list.  ROS Otherwise as in subjective above  Objective: BP 130/80   Pulse 62   Wt 156 lb 6.4 oz (70.9 kg)   SpO2 97%   BMI 23.10 kg/m   General appearance: alert, no distress, well developed, well nourished Left great toenail with brownish coloration, thickened and hypertropic.  Rest of toenails unremarkable Normal pedal pulses    Assessment: Encounter Diagnoses  Name Primary?   Nail deformity Yes   High risk medication use    Essential hypertension    Hyperlipidemia, unspecified hyperlipidemia type      Plan: Nail deformity, possible fungus - pending culture through podiatry.   Just completed Lamisil  oral x 1 mo.  Updated surveillance lab today  Awaiting nail culture  Continue current medications otherwise  We will check into the therapy office about the recent change in availability/insurance issue.  [name] was seen today for follow-up.  Diagnoses and all orders for this visit:  Nail deformity -     Comprehensive metabolic panel with GFR  High risk medication use -     Comprehensive metabolic  panel with GFR  Essential hypertension -     Comprehensive metabolic panel with GFR  Hyperlipidemia, unspecified hyperlipidemia type    Follow up: pending lab

## 2023-07-13 ENCOUNTER — Other Ambulatory Visit: Payer: Self-pay | Admitting: Podiatry

## 2023-07-13 ENCOUNTER — Ambulatory Visit: Payer: Self-pay | Admitting: Medical

## 2023-07-13 LAB — COMPREHENSIVE METABOLIC PANEL WITH GFR
ALT: 14 IU/L (ref 0–44)
AST: 16 IU/L (ref 0–40)
Albumin: 4.3 g/dL (ref 3.9–4.9)
Alkaline Phosphatase: 84 IU/L (ref 44–121)
BUN/Creatinine Ratio: 19 (ref 10–24)
BUN: 21 mg/dL (ref 8–27)
Bilirubin Total: 0.4 mg/dL (ref 0.0–1.2)
CO2: 22 mmol/L (ref 20–29)
Calcium: 9.7 mg/dL (ref 8.6–10.2)
Chloride: 107 mmol/L — ABNORMAL HIGH (ref 96–106)
Creatinine, Ser: 1.09 mg/dL (ref 0.76–1.27)
Globulin, Total: 2.3 g/dL (ref 1.5–4.5)
Glucose: 92 mg/dL (ref 70–99)
Potassium: 3.6 mmol/L (ref 3.5–5.2)
Sodium: 144 mmol/L (ref 134–144)
Total Protein: 6.6 g/dL (ref 6.0–8.5)
eGFR: 75 mL/min/1.73 (ref >59)

## 2023-07-13 NOTE — Progress Notes (Signed)
Results sent to MyChart

## 2023-07-17 ENCOUNTER — Other Ambulatory Visit: Payer: Self-pay | Admitting: Medical

## 2023-07-17 ENCOUNTER — Other Ambulatory Visit: Payer: Self-pay | Admitting: Cardiology

## 2023-07-17 DIAGNOSIS — I10 Essential (primary) hypertension: Secondary | ICD-10-CM

## 2023-07-20 ENCOUNTER — Other Ambulatory Visit: Payer: Self-pay | Admitting: Internal Medicine

## 2023-07-20 DIAGNOSIS — I10 Essential (primary) hypertension: Secondary | ICD-10-CM

## 2023-07-20 MED ORDER — SPIRONOLACTONE 25 MG PO TABS: 25.0000 mg | ORAL_TABLET | Freq: Every day | ORAL | 2 refills | Status: DC

## 2023-07-20 MED ORDER — LABETALOL HCL 100 MG PO TABS: 100.0000 mg | ORAL_TABLET | Freq: Two times a day (BID) | ORAL | 2 refills | Status: DC

## 2023-07-31 ENCOUNTER — Ambulatory Visit: Admitting: Podiatry

## 2023-07-31 ENCOUNTER — Encounter: Payer: Self-pay | Admitting: Podiatry

## 2023-07-31 DIAGNOSIS — L603 Nail dystrophy: Secondary | ICD-10-CM

## 2023-07-31 NOTE — Progress Notes (Signed)
  Subjective:  Patient ID: Steve Burton, male    DOB: July 05, 1955,   MRN: [id number]  Chief Complaint  Patient presents with   Nail Problem    Dr. Joya took a sample of his toenail to see if he has fungus.  He started one month of Lamisil  and he had his liver checked.    68 y.o. male presents for follow-up of nail changes and to review culture results.  Denies any other pedal complaints. Denies n/v/f/c.   Past Medical History:  Diagnosis Date   Elevated LDL cholesterol level    Elevated PSA measurement 11/22/2016   Hyperlipidemia    Hypertension    Impaired fasting glucose 11/22/2016   Left hemiparesis (HCC)    Spastic hemiparesis (HCC)    Stroke (HCC) 2011    Objective:  Physical Exam: Vascular: DP/PT pulses 2/4 bilateral. CFT <3 seconds. Normal hair growth on digits. No edema.  Skin. No lacerations or abrasions bilateral feet. Right hallux nail thickened and dystrophic with subungual debris noted  Musculoskeletal: MMT 5/5 bilateral lower extremities in DF, PF, Inversion and Eversion. Deceased ROM in DF of ankle joint.  Neurological: Sensation intact to light touch.   Assessment:   1. Onychodystrophy       Plan:  Patient was evaluated and treated and all questions answered. -Examined patient -Discussed treatment options for painful dystrophic nails  -Culture negative for fungus.  -Discussed  nail treatment options including  shoe gear modification, topical urea  vs removal of the nail.  -Patient to return as needed  Asberry Joya, DPM

## 2023-08-06 ENCOUNTER — Other Ambulatory Visit: Payer: Self-pay | Admitting: Medical

## 2023-08-07 ENCOUNTER — Telehealth: Payer: Self-pay | Admitting: Internal Medicine

## 2023-08-07 DIAGNOSIS — R252 Cramp and spasm: Secondary | ICD-10-CM

## 2023-08-07 DIAGNOSIS — G8194 Hemiplegia, unspecified affecting left nondominant side: Secondary | ICD-10-CM

## 2023-08-07 DIAGNOSIS — Z8673 Personal history of transient ischemic attack (TIA), and cerebral infarction without residual deficits: Secondary | ICD-10-CM

## 2023-08-07 DIAGNOSIS — G8110 Spastic hemiplegia affecting unspecified side: Secondary | ICD-10-CM

## 2023-08-07 DIAGNOSIS — R29898 Other symptoms and signs involving the musculoskeletal system: Secondary | ICD-10-CM

## 2023-08-07 DIAGNOSIS — Z993 Dependence on wheelchair: Secondary | ICD-10-CM

## 2023-08-07 NOTE — Telephone Encounter (Signed)
    We have placed a referral for help. I have notified wife

## 2023-08-10 ENCOUNTER — Telehealth: Payer: Self-pay

## 2023-08-10 NOTE — Progress Notes (Signed)
 Complex Care Management Note Care Guide Note  08/10/2023 Name: Steve Burton MRN: [id number] DOB: [birth year]   Complex Care Management Outreach Attempts: An unsuccessful telephone outreach was attempted today to offer the patient information about available complex care management services.  Follow Up Plan:  Additional outreach attempts will be made to offer the patient complex care management information and services.   Encounter Outcome:  No Answer  Leotis Rase [facility], [facility] Guide  Direct Dial: [phone]  Fax [phone]

## 2023-08-14 ENCOUNTER — Other Ambulatory Visit: Payer: Self-pay | Admitting: Medical

## 2023-08-17 ENCOUNTER — Other Ambulatory Visit: Payer: Self-pay | Admitting: Cardiology

## 2023-08-17 DIAGNOSIS — I10 Essential (primary) hypertension: Secondary | ICD-10-CM

## 2023-08-29 DIAGNOSIS — Z8673 Personal history of transient ischemic attack (TIA), and cerebral infarction without residual deficits: Secondary | ICD-10-CM

## 2023-08-29 DIAGNOSIS — L814 Other melanin hyperpigmentation: Secondary | ICD-10-CM | POA: Diagnosis not present

## 2023-08-29 DIAGNOSIS — R252 Cramp and spasm: Secondary | ICD-10-CM

## 2023-08-29 DIAGNOSIS — D225 Melanocytic nevi of trunk: Secondary | ICD-10-CM | POA: Diagnosis not present

## 2023-08-29 DIAGNOSIS — D2262 Melanocytic nevi of left upper limb, including shoulder: Secondary | ICD-10-CM | POA: Diagnosis not present

## 2023-08-29 DIAGNOSIS — D2261 Melanocytic nevi of right upper limb, including shoulder: Secondary | ICD-10-CM | POA: Diagnosis not present

## 2023-08-29 DIAGNOSIS — L821 Other seborrheic keratosis: Secondary | ICD-10-CM | POA: Diagnosis not present

## 2023-08-29 DIAGNOSIS — L578 Other skin changes due to chronic exposure to nonionizing radiation: Secondary | ICD-10-CM | POA: Diagnosis not present

## 2023-08-29 DIAGNOSIS — R29898 Other symptoms and signs involving the musculoskeletal system: Secondary | ICD-10-CM

## 2023-08-29 DIAGNOSIS — Z86018 Personal history of other benign neoplasm: Secondary | ICD-10-CM | POA: Diagnosis not present

## 2023-08-29 DIAGNOSIS — D485 Neoplasm of uncertain behavior of skin: Secondary | ICD-10-CM | POA: Diagnosis not present

## 2023-08-30 ENCOUNTER — Other Ambulatory Visit: Payer: Self-pay | Admitting: Cardiology

## 2023-08-30 DIAGNOSIS — I10 Essential (primary) hypertension: Secondary | ICD-10-CM

## 2023-09-04 ENCOUNTER — Other Ambulatory Visit: Payer: Self-pay | Admitting: Medical

## 2023-09-04 ENCOUNTER — Other Ambulatory Visit: Payer: Self-pay | Admitting: Cardiology

## 2023-09-04 DIAGNOSIS — I10 Essential (primary) hypertension: Secondary | ICD-10-CM

## 2023-09-07 ENCOUNTER — Other Ambulatory Visit: Payer: Self-pay | Admitting: Cardiology

## 2023-09-08 NOTE — Telephone Encounter (Signed)
 Steve Burton, I was looking to see what they decided about PT and saw what Steve Burton was asking regarding respite. If she wants to try to have him stay at an assisted living for a short time, they do offer that however she would have to call around and talk to the different locations regarding price and what their minimum stay requirement is. Some require a month minimum and it seems like she's only looking for a few days.

## 2023-09-13 ENCOUNTER — Other Ambulatory Visit: Payer: Self-pay | Admitting: Cardiology

## 2023-09-13 ENCOUNTER — Other Ambulatory Visit: Payer: Self-pay | Admitting: Medical

## 2023-09-13 DIAGNOSIS — I10 Essential (primary) hypertension: Secondary | ICD-10-CM

## 2023-09-14 ENCOUNTER — Telehealth: Payer: Self-pay

## 2023-09-14 NOTE — Progress Notes (Signed)
 Complex Care Management Care Guide Note  09/14/2023 Name: [name] MRN: [id number] DOB: 1955/08/22  Steve Burton is a 68 y.o. year old male who is a primary care patient of Bul[place], Alm GORMAN RIGGERS and is actively engaged with the care management team. I reached out to [name] by phone today to assist with re-scheduling  with the RN Case Manager.  Follow up plan: Unsuccessful telephone outreach attempt made. A HIPAA compliant phone message was left for the patient providing contact information and requesting a return call.  Leotis Rase [facility], [facility] Guide  Direct Dial: [phone]  Fax [phone]

## 2023-09-18 NOTE — Progress Notes (Signed)
 Complex Care Management Care Guide Note  09/18/2023 Name: Steve Burton MRN: [id number] DOB: [birth year]  Steve Burton is a 68 y.o. year old male who is a primary care patient of Bulah, Alm GORMAN RIGGERS and is actively engaged with the care management team. I reached out to [name] by phone today to assist with re-scheduling  with the RN Case Manager.  Follow up plan: Unsuccessful telephone outreach attempt made. A HIPAA compliant phone message was left for the patient providing contact information and requesting a return call.  Leotis Rase [facility], [facility] Guide  Direct Dial: [phone]  Fax [phone]

## 2023-09-20 ENCOUNTER — Other Ambulatory Visit: Payer: Self-pay | Admitting: Cardiology

## 2023-09-20 DIAGNOSIS — C4361 Malignant melanoma of right upper limb, including shoulder: Secondary | ICD-10-CM | POA: Diagnosis not present

## 2023-09-20 DIAGNOSIS — D0362 Melanoma in situ of left upper limb, including shoulder: Secondary | ICD-10-CM | POA: Diagnosis not present

## 2023-09-22 ENCOUNTER — Other Ambulatory Visit: Payer: Self-pay | Admitting: Medical

## 2023-09-25 ENCOUNTER — Telehealth: Payer: Self-pay

## 2023-09-27 ENCOUNTER — Telehealth: Payer: Self-pay | Admitting: Cardiology

## 2023-09-27 DIAGNOSIS — I639 Cerebral infarction, unspecified: Secondary | ICD-10-CM | POA: Diagnosis not present

## 2023-09-27 MED ORDER — SPIRONOLACTONE 25 MG PO TABS: 25.0000 mg | ORAL_TABLET | Freq: Every day | ORAL | 0 refills | Status: DC

## 2023-09-27 MED ORDER — LABETALOL HCL 200 MG PO TABS: 200.0000 mg | ORAL_TABLET | Freq: Two times a day (BID) | ORAL | 0 refills | Status: DC

## 2023-09-27 NOTE — Telephone Encounter (Signed)
*  STAT* If patient is at the pharmacy, call can be transferred to refill team.   1. Which medications need to be refilled? (please list name of each medication and dose if known)   spironolactone  (ALDACTONE ) 25 MG tablet    labetalol  (NORMODYNE ) 200 MG tablet    2. Which pharmacy/location (including street and city if local pharmacy) is medication to be sent to?  [facility] - [place], [place] - [address]      3. Do they need a 30 day or 90 day supply? 90 day

## 2023-09-27 NOTE — Telephone Encounter (Signed)
 RX sent in

## 2023-10-04 DIAGNOSIS — I639 Cerebral infarction, unspecified: Secondary | ICD-10-CM | POA: Diagnosis not present

## 2023-10-05 ENCOUNTER — Other Ambulatory Visit: Payer: Self-pay | Admitting: Cardiology

## 2023-10-05 DIAGNOSIS — I10 Essential (primary) hypertension: Secondary | ICD-10-CM

## 2023-10-05 MED ORDER — LABETALOL HCL 100 MG PO TABS: 100.0000 mg | ORAL_TABLET | Freq: Two times a day (BID) | ORAL | 3 refills | Status: AC

## 2023-10-05 MED ORDER — LABETALOL HCL 200 MG PO TABS: 200.0000 mg | ORAL_TABLET | Freq: Two times a day (BID) | ORAL | 3 refills | Status: DC

## 2023-10-05 NOTE — Telephone Encounter (Signed)
 [facility] requesting clarification on which dose of Labetalol  pt is supposed to be on. See scanned doc in media

## 2023-10-10 DIAGNOSIS — I639 Cerebral infarction, unspecified: Secondary | ICD-10-CM | POA: Diagnosis not present

## 2023-10-12 DIAGNOSIS — I639 Cerebral infarction, unspecified: Secondary | ICD-10-CM | POA: Diagnosis not present

## 2023-10-17 DIAGNOSIS — I639 Cerebral infarction, unspecified: Secondary | ICD-10-CM | POA: Diagnosis not present

## 2023-10-18 ENCOUNTER — Telehealth: Payer: Self-pay

## 2023-10-18 NOTE — Patient Instructions (Signed)
 Steve Burton - I am sorry I was unable to reach you today for our scheduled appointment. I work with Environmental health practitioner, Alm RAMAN, PA-C and am calling to support your healthcare needs. Please contact me at [phone] at your earliest convenience. I look forward to speaking with you soon.   Thank you,   Clayborne Ly RN BSN CCM [place]  [place], [facility] Health Nurse Care Coordinator  Direct Dial: [phone] Website: [email or web address]

## 2023-10-19 ENCOUNTER — Telehealth: Payer: Self-pay | Admitting: Medical

## 2023-10-19 DIAGNOSIS — I639 Cerebral infarction, unspecified: Secondary | ICD-10-CM | POA: Diagnosis not present

## 2023-10-19 NOTE — Telephone Encounter (Signed)
 Copied from CRM #[id number]. Topic: Appointments - Appointment Info/Confirmation >> Oct 19, 2023  9:12 AM Donna BRAVO wrote: Patient/patient representative is calling for information regarding an appointment.  Patient confirmed appt on 10/26/23 and quickly hung up. Patient has an appt on 10/24/23 for the same thing.  Sent patient MyChart message to see which awv appointment works best for him

## 2023-10-20 ENCOUNTER — Other Ambulatory Visit: Payer: Self-pay | Admitting: Medical

## 2023-10-24 ENCOUNTER — Ambulatory Visit

## 2023-10-24 DIAGNOSIS — I639 Cerebral infarction, unspecified: Secondary | ICD-10-CM | POA: Diagnosis not present

## 2023-10-26 ENCOUNTER — Ambulatory Visit

## 2023-10-26 DIAGNOSIS — Z0000 Encounter for general adult medical examination without abnormal findings: Secondary | ICD-10-CM

## 2023-10-26 NOTE — Patient Instructions (Signed)
 Steve Burton,  Thank you for taking the time for your Medicare Wellness Visit. I appreciate your continued commitment to your health goals. Please review the care plan we discussed, and feel free to reach out if I can assist you further.  Medicare recommends these wellness visits once per year to help you and your care team stay ahead of potential health issues. These visits are designed to focus on prevention, allowing your provider to concentrate on managing your acute and chronic conditions during your regular appointments.  Please note that Annual Wellness Visits do not include a physical exam. Some assessments may be limited, especially if the visit was conducted virtually. If needed, we may recommend a separate in-person follow-up with your provider.  Ongoing Care Seeing your primary care provider every 3 to 6 months helps us  monitor your health and provide consistent, personalized care.   Referrals If a referral was made during today's visit and you haven't received any updates within two weeks, please contact the referred provider directly to check on the status.  Recommended Screenings:  Health Maintenance  Topic Date Due   Zoster (Shingles) Vaccine (1 of 2) Never done   Flu Shot  04/02/2024*   DTaP/Tdap/Td vaccine (2 - Tdap) 07/24/2024   Colon Cancer Screening  10/14/2024   Medicare Annual Wellness Visit  10/25/2024   Pneumococcal Vaccine for age over 38  Completed   Hepatitis C Screening  Completed   Meningitis B Vaccine  Aged Out   COVID-19 Vaccine  Discontinued  *Topic was postponed. The date shown is not the original due date.       10/26/2023    3:39 PM  Advanced Directives  Does Patient Have a Medical Advance Directive? Yes  Type of Advance Directive Out of facility DNR (pink MOST or yellow form)   Advance Care Planning is important because it: Ensures you receive medical care that aligns with your values, goals, and preferences. Provides guidance to your family  and loved ones, reducing the emotional burden of decision-making during critical moments.  Vision: Annual vision screenings are recommended for early detection of glaucoma, cataracts, and diabetic retinopathy. These exams can also reveal signs of chronic conditions such as diabetes and high blood pressure.  Dental: Annual dental screenings help detect early signs of oral cancer, gum disease, and other conditions linked to overall health, including heart disease and diabetes.  Please see the attached documents for additional preventive care recommendations.

## 2023-10-26 NOTE — Progress Notes (Signed)
 Subjective:   Steve Burton is a 68 y.o. who presents for a Medicare Wellness preventive visit.  As a reminder, Annual Wellness Visits don't include a physical exam, and some assessments may be limited, especially if this visit is performed virtually. We may recommend an in-person follow-up visit with your provider if needed.  Visit Complete: Virtual I connected with  Steve Burton on 10/26/23 by a audio enabled telemedicine application and verified that I am speaking with the correct person using two identifiers.  Patient Location: Home  Provider Location: Home Office  I discussed the limitations of evaluation and management by telemedicine. The patient expressed understanding and agreed to proceed.  Vital Signs: Because this visit was a virtual/telehealth visit, some criteria may be missing or patient reported. Any vitals not documented were not able to be obtained and vitals that have been documented are patient reported.  VideoError- Librarian, academic were attempted between this provider and patient, however failed, due to patient having technical difficulties OR patient did not have access to video capability.  We continued and completed visit with audio only.   Persons Participating in Visit: Patient assisted by spouse.  AWV Questionnaire: Yes: Patient Medicare AWV questionnaire was completed by the patient on 10/26/2023; I have confirmed that all information answered by patient is correct and no changes since this date.  Cardiac Risk Factors include: advanced age (>85men, >31 women);dyslipidemia;hypertension;male gender     Objective:    Today's Vitals   There is no height or weight on file to calculate BMI.     10/26/2023    3:39 PM 11/12/2021    8:52 AM 11/11/2021    2:18 PM 11/21/2016    8:18 AM 09/10/2014    1:29 PM  Advanced Directives  Does Patient Have a Medical Advance Directive? Yes No No No  No   Type of Advance Directive Out of  facility DNR (pink MOST or yellow form)      Does patient want to make changes to medical advance directive?    No - Patient declined    Would patient like information on creating a medical advance directive?    No - Patient declined       Data saved with a previous flowsheet row definition    Current Medications (verified) Outpatient Encounter Medications as of 10/26/2023  Medication Sig   aspirin  EC (ASPIRIN  LOW DOSE) 81 MG tablet TAKE 1 TABLET BY MOUTH DAILY - SWALLOW WHOLE   atorvastatin  (LIPITOR) 10 MG tablet TAKE 1 TABLET BY MOUTH DAILY   baclofen  (LIORESAL ) 10 MG tablet Take 1 tablet (10 mg total) by mouth 4 (four) times daily.   CARTIA  XT 240 MG 24 hr capsule TAKE 1 CAPSULE BY MOUTH DAILY   hydrALAZINE  (APRESOLINE ) 50 MG tablet TAKE 1 TABLET BY MOUTH 3 TIMES  DAILY   labetalol  (NORMODYNE ) 100 MG tablet Take 1 tablet (100 mg total) by mouth 2 (two) times daily.   levETIRAcetam  (KEPPRA ) 500 MG tablet Take 1 tablet (500 mg total) by mouth 2 (two) times daily.   LORazepam  (ATIVAN ) 0.5 MG tablet Take 1 tablet (0.5 mg total) by mouth 2 (two) times daily as needed for anxiety.   losartan  (COZAAR ) 100 MG tablet TAKE 1 TABLET BY MOUTH DAILY   potassium chloride  SA (KLOR-CON  M) 20 MEQ tablet TAKE 1 TABLET BY MOUTH TWICE  DAILY   spironolactone  (ALDACTONE ) 25 MG tablet Take 1 tablet (25 mg total) by mouth daily.   tamsulosin  (FLOMAX ) 0.4  MG CAPS capsule TAKE 1 CAPSULE BY MOUTH DAILY   labetalol  (NORMODYNE ) 200 MG tablet Take 1 tablet (200 mg total) by mouth 2 (two) times daily. (Patient not taking: Reported on 10/26/2023)   No facility-administered encounter medications on file as of 10/26/2023.    Allergies (verified) Phenytoin  sodium extended and Penicillins   History: Past Medical History:  Diagnosis Date   Elevated LDL cholesterol level    Elevated PSA measurement 11/22/2016   Hyperlipidemia    Hypertension    Impaired fasting glucose 11/22/2016   Left hemiparesis (HCC)     Spastic hemiparesis (HCC)    Stroke (HCC) 2011   Past Surgical History:  Procedure Laterality Date   CATARACT EXTRACTION  01/04/2004   right eye   HERNIA REPAIR Right 01/04/2003   inguinal   Family History  Problem Relation Age of Onset   Stroke Mother    Heart disease Father        MI at age 6   Epilepsy Sister    Colon cancer Neg Hx    Social History   Socioeconomic History   Marital status: Married    Spouse name: Not on file   Number of children: Not on file   Years of education: Not on file   Highest education level: Master's degree (e.g., MA, MS, MEng, MEd, MSW, MBA)  Occupational History   Not on file  Tobacco Use   Smoking status: Never   Smokeless tobacco: Never  Vaping Use   Vaping status: Never Used  Substance and Sexual Activity   Alcohol use: Not Currently    Comment: very rarely   Drug use: No   Sexual activity: Not on file  Other Topics Concern   Not on file  Social History Narrative   Married, walking for exercise some. Uses cane and wheelchair.  Hobbies -  piano.  09/2021   Social Drivers of Health   Financial Resource Strain: Low Risk  (10/26/2023)   Overall Financial Resource Strain (CARDIA)    Difficulty of Paying Living Expenses: Not hard at all  Food Insecurity: No Food Insecurity (10/26/2023)   Hunger Vital Sign    Worried About Running Out of Food in the Last Year: Never true    Ran Out of Food in the Last Year: Never true  Transportation Needs: No Transportation Needs (10/26/2023)   PRAPARE - Administrator, Civil Service (Medical): No    Lack of Transportation (Non-Medical): No  Physical Activity: Inactive (10/26/2023)   Exercise Vital Sign    Days of Exercise per Week: 0 days    Minutes of Exercise per Session: 0 min  Stress: No Stress Concern Present (10/26/2023)   [employer] of Occupational Health - Occupational Stress Questionnaire    Feeling of Stress: Not at all  Social Connections: Moderately Isolated  (10/26/2023)   Social Connection and Isolation Panel    Frequency of Communication with Friends and Family: More than three times a week    Frequency of Social Gatherings with Friends and Family: Twice a week    Attends Religious Services: Never    Database administrator or Organizations: No    Attends Engineer, structural: Not on file    Marital Status: Married    Tobacco Counseling Counseling given: Not Answered    Clinical Intake:  Pre-visit preparation completed: Yes  Pain : No/denies pain     Nutritional Risks: None Diabetes: No  Lab Results  Component Value Date  HGBA1C 5.5 04/11/2022   HGBA1C 5.7 (H) 09/27/2021   HGBA1C 5.7 (H) 01/06/2021     How often do you need to have someone help you when you read instructions, pamphlets, or other written materials from your doctor or pharmacy?: 1 - Never  Interpreter Needed?: No  Information entered by :: NAllen LPN   Activities of Daily Living     10/26/2023    8:47 AM  In your present state of health, do you have any difficulty performing the following activities:  Hearing? 0  Vision? 0  Difficulty concentrating or making decisions? 0  Walking or climbing stairs? 1  Dressing or bathing? 0  Doing errands, shopping? 1  Preparing Food and eating ? Y  Using the Toilet? N  In the past six months, have you accidently leaked urine? N  Do you have problems with loss of bowel control? N  Managing your Medications? N  Managing your Finances? N  Housekeeping or managing your Housekeeping? Y    Patient Care Team: Tysinger, Alm RAMAN, PA-C as PCP - General (Family Medicine) Pietro Steve RAMAN, MD as PCP - Cardiology (Cardiology)  I have updated your Care Teams any recent Medical Services you may have received from other providers in the past year.     Assessment:   This is a routine wellness examination for Steve Burton.  Hearing/Vision screen Hearing Screening - Comments:: Trouble hearing, but no hearing  aids Vision Screening - Comments:: No regular eye exams   Goals Addressed             This Visit's Progress    Patient Stated       10/26/2023, working on getting stronger       Depression Screen     10/26/2023    3:41 PM 10/03/2022    3:47 PM 11/22/2021    2:23 PM 09/27/2021    2:42 PM 03/23/2021    2:24 PM 09/25/2020    9:22 AM 06/05/2019    1:00 PM  PHQ 2/9 Scores  PHQ - 2 Score 0 0 0 0 0 0 0  PHQ- 9 Score   2        Fall Risk     10/26/2023    8:47 AM 10/03/2022    3:47 PM 04/11/2022   10:23 AM 09/27/2021    2:42 PM 03/23/2021    2:24 PM  Fall Risk   Falls in the past year? 0 0 0 0 0  Number falls in past yr: 0 0 0 0 0  Injury with Fall? 0 0 0 0 0  Risk for fall due to :  No Fall Risks No Fall Risks No Fall Risks No Fall Risks  Follow up  Falls evaluation completed Falls evaluation completed Falls evaluation completed  Falls evaluation completed      Data saved with a previous flowsheet row definition    MEDICARE RISK AT HOME:  Medicare Risk at Home Any stairs in or around the home?: (Patient-Rptd) No If so, are there any without handrails?: (Patient-Rptd) No Home free of loose throw rugs in walkways, pet beds, electrical cords, etc?: (Patient-Rptd) Yes Adequate lighting in your home to reduce risk of falls?: (Patient-Rptd) Yes Use of a cane, walker or w/c?: (Patient-Rptd) Yes Grab bars in the bathroom?: Yes Shower chair or bench in shower?: (Patient-Rptd) Yes Elevated toilet seat or a handicapped toilet?: (Patient-Rptd) Yes  TIMED UP AND GO:  Was the test performed?  No  Cognitive Function: 6CIT completed  10/26/2023    3:42 PM  6CIT Screen  What Year? 0 points  What month? 0 points  What time? 0 points  Count back from 20 0 points  Months in reverse 0 points  Repeat phrase 0 points  Total Score 0 points    Immunizations Immunization History  Administered Date(s) Administered   Moderna Sars-Covid-2 Vaccination 04/30/2019, 05/28/2019    PNEUMOCOCCAL CONJUGATE-20 10/03/2022   Td 07/25/2014    Screening Tests Health Maintenance  Topic Date Due   Zoster Vaccines- Shingrix (1 of 2) Never done   Influenza Vaccine  04/02/2024 (Originally 08/04/2023)   DTaP/Tdap/Td (2 - Tdap) 07/24/2024   Colonoscopy  10/14/2024   Medicare Annual Wellness (AWV)  10/25/2024   Pneumococcal Vaccine: 50+ Years  Completed   Hepatitis C Screening  Completed   Meningococcal B Vaccine  Aged Out   COVID-19 Vaccine  Discontinued    Health Maintenance Items Addressed: Declines shingles vaccine  Additional Screening:  Vision Screening: Recommended annual ophthalmology exams for early detection of glaucoma and other disorders of the eye. Is the patient up to date with their annual eye exam?  No  Who is the provider or what is the name of the office in which the patient attends annual eye exams? none  Dental Screening: Recommended annual dental exams for proper oral hygiene  Community Resource Referral / Chronic Care Management: CRR required this visit?  No   CCM required this visit?  No   Plan:    I have personally reviewed and noted the following in the patient's chart:   Medical and social history Use of alcohol, tobacco or illicit drugs  Current medications and supplements including opioid prescriptions. Patient is not currently taking opioid prescriptions. Functional ability and status Nutritional status Physical activity Advanced directives List of other physicians Hospitalizations, surgeries, and ER visits in previous 12 months Vitals Screenings to include cognitive, depression, and falls Referrals and appointments  In addition, I have reviewed and discussed with patient certain preventive protocols, quality metrics, and best practice recommendations. A written personalized care plan for preventive services as well as general preventive health recommendations were provided to patient.   Ardella FORBES Dawn, LPN   [id number]/[id number]    After Visit Summary: (MyChart) Due to this being a telephonic visit, the after visit summary with patients personalized plan was offered to patient via MyChart   Notes: Nothing significant to report at this time.

## 2023-11-02 DIAGNOSIS — I639 Cerebral infarction, unspecified: Secondary | ICD-10-CM | POA: Diagnosis not present

## 2023-11-06 ENCOUNTER — Other Ambulatory Visit: Payer: Self-pay | Admitting: Medical

## 2023-11-15 ENCOUNTER — Ambulatory Visit: Payer: Self-pay | Admitting: Medical

## 2023-11-22 ENCOUNTER — Telehealth: Payer: Self-pay

## 2023-11-22 NOTE — Patient Instructions (Signed)
 [name] - I am sorry I was unable to reach you today for our scheduled appointment. I work with Environmental health practitioner, Alm RAMAN, PA-C and am calling to support your healthcare needs. Please contact me at [phone] at your earliest convenience. I look forward to speaking with you soon.   Thank you,   Clayborne Ly RN BSN CCM [place]  [place], [facility] Health Nurse Care Coordinator  Direct Dial: [phone] Website: [email or web address]

## 2023-11-24 NOTE — Progress Notes (Deleted)
 HPI: FU hypertension and possible HOCM. Seen previously by Dr Debera for hypertension. Echo 4/11 showed hyperdynamic LV function, moderate LVH, intracavitary gradient of 4 m/s, grade 1 diastolic dysfunction, SAM with peak LVOT gradient of 60 mmHg. Carotid dopplers 4/11 showed no stenosis bilaterally. Cardiac MRI December 2016 showed normal LV function, focal basal septal hypertrophy, no SAM and hypertrophic cardiomyopathy could not be excluded but findings also could be consistent with hypertension.  Had CVA 11/23. MRI 11/23 showed small acute infarct left frontal white matter. Echo 11/23 showed normal LV function, grade 1 DD, mild AI.  CTA 1123 showed no large vessel occlusion.  Since last seen,   Current Outpatient Medications  Medication Sig Dispense Refill   aspirin  EC (ASPIRIN  LOW DOSE) 81 MG tablet TAKE 1 TABLET BY MOUTH DAILY - SWALLOW WHOLE 100 tablet 1   atorvastatin  (LIPITOR) 10 MG tablet TAKE 1 TABLET BY MOUTH DAILY 30 tablet 2   baclofen  (LIORESAL ) 10 MG tablet Take 1 tablet (10 mg total) by mouth 4 (four) times daily. 400 tablet 3   CARTIA  XT 240 MG 24 hr capsule TAKE 1 CAPSULE BY MOUTH DAILY 30 capsule 11   hydrALAZINE  (APRESOLINE ) 50 MG tablet TAKE 1 TABLET BY MOUTH 3 TIMES  DAILY 300 tablet 1   labetalol  (NORMODYNE ) 100 MG tablet Take 1 tablet (100 mg total) by mouth 2 (two) times daily. 180 tablet 3   labetalol  (NORMODYNE ) 200 MG tablet Take 1 tablet (200 mg total) by mouth 2 (two) times daily. (Patient not taking: Reported on 10/26/2023) 180 tablet 3   levETIRAcetam  (KEPPRA ) 500 MG tablet Take 1 tablet (500 mg total) by mouth 2 (two) times daily. 200 tablet 3   LORazepam  (ATIVAN ) 0.5 MG tablet Take 1 tablet (0.5 mg total) by mouth 2 (two) times daily as needed for anxiety. 45 tablet 1   losartan  (COZAAR ) 100 MG tablet TAKE 1 TABLET BY MOUTH DAILY 100 tablet 2   potassium chloride  SA (KLOR-CON  M) 20 MEQ tablet TAKE 1 TABLET BY MOUTH TWICE  DAILY 200 tablet 2   spironolactone   (ALDACTONE ) 25 MG tablet Take 1 tablet (25 mg total) by mouth daily. 90 tablet 0   tamsulosin  (FLOMAX ) 0.4 MG CAPS capsule TAKE 1 CAPSULE BY MOUTH DAILY 90 capsule 0   No current facility-administered medications for this visit.     Past Medical History:  Diagnosis Date   Elevated LDL cholesterol level    Elevated PSA measurement 11/22/2016   Hyperlipidemia    Hypertension    Impaired fasting glucose 11/22/2016   Left hemiparesis (HCC)    Spastic hemiparesis (HCC)    Stroke (HCC) 2011    Past Surgical History:  Procedure Laterality Date   CATARACT EXTRACTION  01/04/2004   right eye   HERNIA REPAIR Right 01/04/2003   inguinal    Social History   Socioeconomic History   Marital status: Married    Spouse name: Not on file   Number of children: Not on file   Years of education: Not on file   Highest education level: Master's degree (e.g., MA, MS, MEng, MEd, MSW, MBA)  Occupational History   Not on file  Tobacco Use   Smoking status: Never   Smokeless tobacco: Never  Vaping Use   Vaping status: Never Used  Substance and Sexual Activity   Alcohol use: Not Currently    Comment: very rarely   Drug use: No   Sexual activity: Not on file  Other Topics Concern  Not on file  Social History Narrative   Married, walking for exercise some. Uses cane and wheelchair.  Hobbies -  piano.  09/2021   Social Drivers of Health   Financial Resource Strain: Low Risk  (10/26/2023)   Overall Financial Resource Strain (CARDIA)    Difficulty of Paying Living Expenses: Not hard at all  Food Insecurity: No Food Insecurity (10/26/2023)   Hunger Vital Sign    Worried About Running Out of Food in the Last Year: Never true    Ran Out of Food in the Last Year: Never true  Transportation Needs: No Transportation Needs (10/26/2023)   PRAPARE - Administrator, Civil Service (Medical): No    Lack of Transportation (Non-Medical): No  Physical Activity: Inactive (10/26/2023)    Exercise Vital Sign    Days of Exercise per Week: 0 days    Minutes of Exercise per Session: 0 min  Stress: No Stress Concern Present (10/26/2023)   [employer] of Occupational Health - Occupational Stress Questionnaire    Feeling of Stress: Not at all  Social Connections: Moderately Isolated (10/26/2023)   Social Connection and Isolation Panel    Frequency of Communication with Friends and Family: More than three times a week    Frequency of Social Gatherings with Friends and Family: Twice a week    Attends Religious Services: Never    Database Administrator or Organizations: No    Attends Engineer, Structural: Not on file    Marital Status: Married  Catering Manager Violence: Not At Risk (10/26/2023)   Humiliation, Afraid, Rape, and Kick questionnaire    Fear of Current or Ex-Partner: No    Emotionally Abused: No    Physically Abused: No    Sexually Abused: No    Family History  Problem Relation Age of Onset   Stroke Mother    Heart disease Father        MI at age 37   Epilepsy Sister    Colon cancer Neg Hx     ROS: no fevers or chills, productive cough, hemoptysis, dysphasia, odynophagia, melena, hematochezia, dysuria, hematuria, rash, seizure activity, orthopnea, PND, pedal edema, claudication. Remaining systems are negative.  Physical Exam: Well-developed well-nourished in no acute distress.  Skin is warm and dry.  HEENT is normal.  Neck is supple.  Chest is clear to auscultation with normal expansion.  Cardiovascular exam is regular rate and rhythm.  Abdominal exam nontender or distended. No masses palpated. Extremities show no edema. neuro grossly intact  ECG- personally reviewed  A/P  1 question history of hypertrophic cardiomyopathy-previous echocardiogram suggestive of HOCM but follow-up MRI and echocardiogram suggested likely more related to hypertension.  Will repeat study.  2 hypertension-blood pressure controlled.  Continue present  medications.  3 hyperlipidemia-continue statin.  4 H/O CVA  Redell Shallow, MD

## 2023-11-27 ENCOUNTER — Ambulatory Visit: Admitting: Podiatry

## 2023-11-28 ENCOUNTER — Other Ambulatory Visit: Payer: Self-pay | Admitting: Cardiology

## 2023-11-29 ENCOUNTER — Ambulatory Visit: Admitting: Medical

## 2023-12-08 ENCOUNTER — Ambulatory Visit: Admitting: Cardiology

## 2023-12-13 ENCOUNTER — Ambulatory Visit: Admitting: Medical

## 2023-12-18 ENCOUNTER — Other Ambulatory Visit: Payer: Self-pay | Admitting: Cardiology

## 2023-12-22 ENCOUNTER — Encounter: Payer: Self-pay | Admitting: Medical

## 2023-12-31 ENCOUNTER — Other Ambulatory Visit: Payer: Self-pay | Admitting: Medical

## 2024-01-01 NOTE — Telephone Encounter (Signed)
 Pt is due for a visit. Please schedule then I can refill medication

## 2024-01-02 NOTE — Telephone Encounter (Signed)
 Appt scheduled 01/24/24

## 2024-01-07 ENCOUNTER — Other Ambulatory Visit: Payer: Self-pay | Admitting: Medical

## 2024-01-10 ENCOUNTER — Ambulatory Visit: Payer: Self-pay

## 2024-01-10 NOTE — Telephone Encounter (Signed)
"  Left message for pt to call back   "

## 2024-01-10 NOTE — Telephone Encounter (Signed)
 FYI Only or Action Required?: Action required by provider: clinical question for provider. Wants to know if it is safe to take pepto with aspirin  regimen.  Patient was last seen in primary care on 07/12/2023 by Bulah Alm RAMAN, PA-C.  Called Nurse Triage reporting Diarrhea.  Symptoms began yesterday.  Interventions attempted: OTC medications: Pepto.  Symptoms are: unchanged.  Triage Disposition: See Physician Within 24 Hours  Patient/caregiver understands and will follow disposition?: Yes  Reason for Disposition  Weak immune system (e.g., HIV positive, cancer chemo, splenectomy, organ transplant, chronic steroids)  Answer Assessment - Initial Assessment Questions Patient states he feels fine, no pain, fatigue. Says he is always weak, had 2 strokes, had PT yesterday and is always tired after. Feels the same as always after PT except the diarrhea. Wants to know if it is safe for patient to take pepto bismo. Wife says he got 2 pills, gave 1 pill and diarrhea stopped. This morning started again and gave 1 more and hasn't happened since. Patient wife says pepto box says not to give if taking blood thinners, taking aspirin  daily. Says the smell is so terrible. Patient says she could not get him transferred from wheel chair to toilet before patient had bowel movement on himself. This is very abnormal.   Patient has been urinating a lot. Patient is a little leary of drinking water because he has to urinate so much. Urine looks dark and has odor. No blood or cloudiness seen. Patient had appointment to talk to doctor later this month for this concern. Hx kidney issues. Patient asked while on phone how he feels, if he feels weak or fatigued. He states no, normal weakness that happens day after PT, no change at all. I asked if they felt safe waiting till the morning appointment and they do. Appointment booked for 8:15 01/11/2024 first available. Told herto go to ER/call if patient condition changes. 1.  DIARRHEA SEVERITY: How bad is the diarrhea? How many more stools have you had in the past 24 hours than normal?      2 2. ONSET: When did the diarrhea begin?      Last night 3. STOOL DESCRIPTION:  How loose or watery is the diarrhea? What is the stool color? Is there any blood or mucous in the stool?     Loose sticky 4. VOMITING: Are you also vomiting? If Yes, ask: How many times in the past 24 hours?      Denies 5. ABDOMEN PAIN: Are you having any abdomen pain? If Yes, ask: What does it feel like? (e.g., crampy, dull, intermittent, constant)      Denies 6. ABDOMEN PAIN SEVERITY: If present, ask: How bad is the pain?  (e.g., Scale 1-10; mild, moderate, or severe)     Denies 7. ORAL INTAKE: If vomiting, Have you been able to drink liquids? How much liquids have you had in the past 24 hours?     Denies any problems with hydration. 8. HYDRATION: Any signs of dehydration? (e.g., dry mouth [not just dry lips], too weak to stand, dizziness, new weight loss) When did you last urinate?     Tongue is dark, blackish on back could be pepto bismol 9. EXPOSURE: Have you traveled to a foreign country recently? Have you been exposed to anyone with diarrhea? Could you have eaten any food that was spoiled?     Denies 10. ANTIBIOTIC USE: Are you taking antibiotics now or have you taken antibiotics in the past 2 months?  Denies 11. OTHER SYMPTOMS: Do you have any other symptoms? (e.g., fever, blood in stool)       Denies  Protocols used: Diarrhea-A-AH

## 2024-01-10 NOTE — Telephone Encounter (Signed)
 Pt was notified.

## 2024-01-10 NOTE — Telephone Encounter (Signed)
 There is risk of bleeding due to them both having salicylate. It would be better to take imodium (over-the-counter) for diarrhea instead. He has appt with Ludie in the morning.  Please advise

## 2024-01-11 ENCOUNTER — Other Ambulatory Visit: Payer: Self-pay | Admitting: Cardiology

## 2024-01-11 ENCOUNTER — Ambulatory Visit (INDEPENDENT_AMBULATORY_CARE_PROVIDER_SITE_OTHER): Admitting: Medical

## 2024-01-11 VITALS — BP 120/70 | HR 70 | Temp 97.3°F | Wt 157.8 lb

## 2024-01-11 DIAGNOSIS — R7301 Impaired fasting glucose: Secondary | ICD-10-CM

## 2024-01-11 DIAGNOSIS — G8110 Spastic hemiplegia affecting unspecified side: Secondary | ICD-10-CM | POA: Diagnosis not present

## 2024-01-11 DIAGNOSIS — Z993 Dependence on wheelchair: Secondary | ICD-10-CM

## 2024-01-11 DIAGNOSIS — R8290 Unspecified abnormal findings in urine: Secondary | ICD-10-CM

## 2024-01-11 DIAGNOSIS — I10 Essential (primary) hypertension: Secondary | ICD-10-CM

## 2024-01-11 DIAGNOSIS — R454 Irritability and anger: Secondary | ICD-10-CM

## 2024-01-11 DIAGNOSIS — G40909 Epilepsy, unspecified, not intractable, without status epilepticus: Secondary | ICD-10-CM | POA: Diagnosis not present

## 2024-01-11 DIAGNOSIS — R195 Other fecal abnormalities: Secondary | ICD-10-CM | POA: Diagnosis not present

## 2024-01-11 DIAGNOSIS — R32 Unspecified urinary incontinence: Secondary | ICD-10-CM

## 2024-01-11 DIAGNOSIS — R451 Restlessness and agitation: Secondary | ICD-10-CM

## 2024-01-11 DIAGNOSIS — R350 Frequency of micturition: Secondary | ICD-10-CM | POA: Diagnosis not present

## 2024-01-11 DIAGNOSIS — N401 Enlarged prostate with lower urinary tract symptoms: Secondary | ICD-10-CM

## 2024-01-11 DIAGNOSIS — R1310 Dysphagia, unspecified: Secondary | ICD-10-CM

## 2024-01-11 DIAGNOSIS — K148 Other diseases of tongue: Secondary | ICD-10-CM

## 2024-01-11 LAB — POCT URINALYSIS DIP (PROADVANTAGE DEVICE)
Bilirubin, UA: NEGATIVE
Blood, UA: NEGATIVE
Glucose, UA: NEGATIVE mg/dL
Leukocytes, UA: NEGATIVE
Nitrite, UA: NEGATIVE
Protein Ur, POC: NEGATIVE mg/dL
Specific Gravity, Urine: 1.01
Urobilinogen, Ur: NEGATIVE
pH, UA: 6

## 2024-01-11 MED ORDER — PAROXETINE HCL 20 MG PO TABS: 20.0000 mg | ORAL_TABLET | Freq: Every day | ORAL | 0 refills | Status: DC

## 2024-01-11 NOTE — Addendum Note (Signed)
 Addended by: BULAH ALM RAMAN on: 01/11/2024 08:48 AM   Modules accepted: Orders

## 2024-01-11 NOTE — Progress Notes (Addendum)
 "  Name: Steve Burton   Date of Visit: 01/11/2024   Date of last visit with me: 01/07/2024   CHIEF COMPLAINT:  Chief Complaint  Patient presents with   Acute Visit    Urine frequent, strong odor, had loose dirrahea on Tuesday and Wednesday morning- sticky and strong odor. Has a black tongue- noticed yesterday trouble swallowing medication. Still having outburst of anger sometimes.        HPI:  Discussed the use of AI scribe software for clinical note transcription with the patient, who gave verbal consent to proceed.  History of Present Illness  Steve Burton is a 69 year old male who presents with diarrhea and urinary symptoms, and multiple concerns.  Accompanied by wife and caregiver Steve Burton.  He experienced diarrhea starting 2 days ago on Tuesday night, described as very runny and sticky. He was given one dose of Pepto Bismol, which had previously been effective, but the diarrhea recurred on Wednesday morning yesterday. There have been no further episodes since then.  He has been urinating more frequently than usual, with a strong odor noted in the urine. This increased frequency and odor are new symptoms. He is hesitant to drink water due to fear of increased urination, which affects his medication intake as he sometimes holds pills in his mouth for too long. He is currently on spironolactone . He also takes Flomax  once daily in the morning, which was previously administered twice daily in the hospital.  He has been experiencing difficulty swallowing, with occasional choking and coughing while eating. His daughter mentioned that his sinuses drip when eating warm food, which may contribute to the swallowing issues. There is a family history of similar swallowing difficulties, as his father had similar issues before passing away.  His tongue has recently developed a black discoloration, which was noticed by his daughter when administering medication.  Wants his vitamin D  checked.  He has  been experiencing mood swings and agitation, described as 'explosions' or 'volcano-like' outbursts, during which he may slap his face. He was previously on lorazepam  but stopped due to concerns about dependency and ineffectiveness when used sporadically.   No recent reported edema.  Past Medical History:  Diagnosis Date   Elevated LDL cholesterol level    Elevated PSA measurement 11/22/2016   Hyperlipidemia    Hypertension    Impaired fasting glucose 11/22/2016   Left hemiparesis (HCC)    Spastic hemiparesis (HCC)    Stroke (HCC) 2011   Medications Ordered Prior to Encounter[1]  ROS as in subjective    Objective: BP 120/70   Pulse 70   Temp (!) 97.3 F (36.3 C)   Wt 157 lb 12.8 oz (71.6 kg)   SpO2 96%   BMI 23.30 kg/m   General appearence: alert, no distress, WD/WN,  HEENT: normocephalic, sclerae anicteric Oral cavity: somewhat dry MM, somewhat dark coloration and fuzziness of tongue Neck: supple, no lymphadenopathy, no thyromegaly, no masses Abdomen: +bs, soft, non tender, non distended, no masses, no hepatomegaly, no splenomegaly Pulses: 2+ symmetric, upper and lower extremities, normal cap refill No ext edema    Assessment: Encounter Diagnoses  Name Primary?   Urine frequency Yes   Abnormal urine odor    Loose stools    Dysphagia, unspecified type    Tongue discoloration    Spastic hemiparesis (HCC)    Impaired fasting glucose    Irritability    Essential hypertension    Agitation    Wheelchair dependence    Urinary incontinence, unspecified  type    Benign prostatic hyperplasia with urinary frequency    Seizure disorder (HCC)       Plan:  Urinary frequency and abnormal urine odor Increased urinary frequency with strong odor. Possible UTI or medication side effects. - Sent urine for culture. - Switch Flomax  to nighttime dosing.  Loose stools Recent episodes of loose stools.  Improving with pepto bismol  Dysphagia Intermittent difficulty  swallowing -consider swallow study referral  Tongue discoloration Black tongue possibly due to medication or nutritional deficiency. - Ordered blood work for nutritional deficiencies.  Agitation and irritability Intermittent episodes. Lorazepam  was not helping. - Consider daily medication for agitation and mood. -Discontinue lorazepam  -begin trial of Paxil /paroxetine   Essential hypertension Blood pressure well-controlled. Spironolactone  may contribute to urinary frequency. - Ordered blood work. - Consider reducing spironolactone  dose if labs indicate given urination frequency concerns. - Continue cardia XT to 40 mg daily -Continue hydralazine  50 mg 3 times a day -Continue labetalol  100 mg twice daily  BPH with urinary symptoms -Change Flomax  to nighttime -Consider cutting back on spironolactone  -Updated PSA blood test today  Impaired glucose -Updated labs today  History of seizure disorder, history of stroke -Continue Keppra  per neurology.  General health maintenance Possible vitamin D  deficiency due to limited sun exposure. - Ordered blood work to assess vitamin D  levels.  Steve Burton was seen today for acute visit.  Diagnoses and all orders for this visit:  Urine frequency -     POCT Urinalysis DIP (Proadvantage Device) -     Urine Culture  Abnormal urine odor -     Urine Culture  Loose stools -     CBC with Differential/Platelet -     Comprehensive metabolic panel with GFR  Dysphagia, unspecified type -     VITAMIN D  25 Hydroxy (Vit-D Deficiency, Fractures)  Tongue discoloration -     CBC with Differential/Platelet -     Comprehensive metabolic panel with GFR -     VITAMIN D  25 Hydroxy (Vit-D Deficiency, Fractures)  Spastic hemiparesis (HCC)  Impaired fasting glucose -     Hemoglobin A1c  Irritability  Essential hypertension  Agitation -     VITAMIN D  25 Hydroxy (Vit-D Deficiency, Fractures)  Wheelchair dependence  Urinary incontinence,  unspecified type -     PSA  Benign prostatic hyperplasia with urinary frequency -     PSA  Seizure disorder (HCC)  Other orders -     PARoxetine  (PAXIL ) 20 MG tablet; Take 1 tablet (20 mg total) by mouth daily.    F/u pending labs         [1]  Current Outpatient Medications on File Prior to Visit  Medication Sig Dispense Refill   aspirin  EC (ASPIRIN  LOW DOSE) 81 MG tablet TAKE 1 TABLET BY MOUTH DAILY - SWALLOW WHOLE 100 tablet 1   atorvastatin  (LIPITOR) 10 MG tablet TAKE 1 TABLET BY MOUTH DAILY 90 tablet 0   baclofen  (LIORESAL ) 10 MG tablet Take 1 tablet (10 mg total) by mouth 4 (four) times daily. 400 tablet 3   CARTIA  XT 240 MG 24 hr capsule TAKE 1 CAPSULE BY MOUTH DAILY 30 capsule 11   hydrALAZINE  (APRESOLINE ) 50 MG tablet TAKE 1 TABLET BY MOUTH 3 TIMES  DAILY 300 tablet 1   labetalol  (NORMODYNE ) 100 MG tablet Take 1 tablet (100 mg total) by mouth 2 (two) times daily. 180 tablet 3   levETIRAcetam  (KEPPRA ) 500 MG tablet Take 1 tablet (500 mg total) by mouth 2 (two) times daily.  200 tablet 3   LORazepam  (ATIVAN ) 0.5 MG tablet Take 1 tablet (0.5 mg total) by mouth 2 (two) times daily as needed for anxiety. 45 tablet 1   losartan  (COZAAR ) 100 MG tablet TAKE 1 TABLET BY MOUTH DAILY 100 tablet 2   potassium chloride  SA (KLOR-CON  M) 20 MEQ tablet TAKE 1 TABLET BY MOUTH TWICE  DAILY 200 tablet 2   spironolactone  (ALDACTONE ) 25 MG tablet Take 1 tablet (25 mg total) by mouth daily. Keep overdue follow up appt on 12/5 for further refills 15 tablet 0   tamsulosin  (FLOMAX ) 0.4 MG CAPS capsule TAKE 1 CAPSULE BY MOUTH DAILY 90 capsule 0   No current facility-administered medications on file prior to visit.   "

## 2024-01-11 NOTE — Patient Instructions (Signed)
 Urinary frequency and abnormal urine odor Increased urinary frequency with strong odor. Possible UTI or medication side effects. - Sent urine for culture. - Switch Flomax  to nighttime dosing.  Loose stools Recent episodes of loose stools.  Improving with pepto bismol  Dysphagia Intermittent difficulty swallowing -consider swallow study referral  Tongue discoloration Black tongue possibly due to medication or nutritional deficiency. - Ordered blood work for nutritional deficiencies.  Agitation and irritability Intermittent episodes. Lorazepam  was not helping. - Consider daily medication for agitation and mood. -Discontinue lorazepam   Essential hypertension Blood pressure well-controlled. Spironolactone  may contribute to urinary frequency. - Ordered blood work. - Consider reducing spironolactone  dose if labs indicate given urination frequency concerns. - Continue cardia XT to 40 mg daily -Continue hydralazine  50 mg 3 times a day -Continue labetalol  100 mg twice daily  BPH with urinary symptoms -Change Flomax  to nighttime -Consider cutting back on spironolactone  -Updated PSA blood test today  Impaired glucose -Updated labs today  History of seizure disorder, history of stroke -Continue Keppra  per neurology.  General health maintenance Possible vitamin D  deficiency due to limited sun exposure. - Ordered blood work to assess vitamin D  levels.

## 2024-01-12 ENCOUNTER — Ambulatory Visit: Payer: Self-pay | Admitting: Medical

## 2024-01-12 ENCOUNTER — Other Ambulatory Visit: Payer: Self-pay | Admitting: Medical

## 2024-01-12 LAB — CBC WITH DIFFERENTIAL/PLATELET
Basophils Absolute: 0 x10E3/uL (ref 0.0–0.2)
Basos: 0 %
EOS (ABSOLUTE): 0.3 x10E3/uL (ref 0.0–0.4)
Eos: 5 %
Hematocrit: 36.7 % — ABNORMAL LOW (ref 37.5–51.0)
Hemoglobin: 12 g/dL — ABNORMAL LOW (ref 13.0–17.7)
Immature Grans (Abs): 0 x10E3/uL (ref 0.0–0.1)
Immature Granulocytes: 0 %
Lymphocytes Absolute: 1.4 x10E3/uL (ref 0.7–3.1)
Lymphs: 21 %
MCH: 28.9 pg (ref 26.6–33.0)
MCHC: 32.7 g/dL (ref 31.5–35.7)
MCV: 88 fL (ref 79–97)
Monocytes Absolute: 0.6 x10E3/uL (ref 0.1–0.9)
Monocytes: 9 %
Neutrophils Absolute: 4.3 x10E3/uL (ref 1.4–7.0)
Neutrophils: 65 %
Platelets: 152 x10E3/uL (ref 150–450)
RBC: 4.15 x10E6/uL (ref 4.14–5.80)
RDW: 13.5 % (ref 11.6–15.4)
WBC: 6.6 x10E3/uL (ref 3.4–10.8)

## 2024-01-12 LAB — COMPREHENSIVE METABOLIC PANEL WITH GFR
ALT: 16 IU/L (ref 0–44)
AST: 12 IU/L (ref 0–40)
Albumin: 4.1 g/dL (ref 3.9–4.9)
Alkaline Phosphatase: 86 IU/L (ref 47–123)
BUN/Creatinine Ratio: 22 (ref 10–24)
BUN: 23 mg/dL (ref 8–27)
Bilirubin Total: 0.6 mg/dL (ref 0.0–1.2)
CO2: 23 mmol/L (ref 20–29)
Calcium: 9.3 mg/dL (ref 8.6–10.2)
Chloride: 106 mmol/L (ref 96–106)
Creatinine, Ser: 1.03 mg/dL (ref 0.76–1.27)
Globulin, Total: 2.4 g/dL (ref 1.5–4.5)
Glucose: 116 mg/dL — ABNORMAL HIGH (ref 70–99)
Potassium: 2.9 mmol/L — ABNORMAL LOW (ref 3.5–5.2)
Sodium: 144 mmol/L (ref 134–144)
Total Protein: 6.5 g/dL (ref 6.0–8.5)
eGFR: 80 mL/min/1.73

## 2024-01-12 LAB — PSA: Prostate Specific Ag, Serum: 2.5 ng/mL (ref 0.0–4.0)

## 2024-01-12 LAB — VITAMIN D 25 HYDROXY (VIT D DEFICIENCY, FRACTURES): Vit D, 25-Hydroxy: 31.3 ng/mL (ref 30.0–100.0)

## 2024-01-12 LAB — HEMOGLOBIN A1C
Est. average glucose Bld gHb Est-mCnc: 111 mg/dL
Hgb A1c MFr Bld: 5.5 % (ref 4.8–5.6)

## 2024-01-12 MED ORDER — VITAMIN D (ERGOCALCIFEROL) 1.25 MG (50000 UNIT) PO CAPS: 50000.0000 [IU] | ORAL_CAPSULE | ORAL | 0 refills | Status: AC

## 2024-01-12 NOTE — Telephone Encounter (Signed)
 Copied from CRM [id number]. Topic: Clinical - Lab/Test Results >> Jan 12, 2024 12:26 PM Fonda T wrote: Reason for CRM: Pt spouse, [name], is returning call to [place] at office.  Call is in regards to lab results, pt spouse requesting to speak directly to office for results, and with questions.  Pt spouse can be reached back at [phone].  Caller aware of same day call back.

## 2024-01-12 NOTE — Progress Notes (Signed)
 Still pending urine culture  Labs show low vitamin D , low potassium, otherwise labs stable  Recommendations: 1- begin vitamin D  prescription weekly 2-begin Paxil  to help with agitation 3-I recommend stopping or reducing Spironolactone  in half for now.   I assume you are still taking the potasium daily. 4-plan recheck in person in 1 month, to recheck on these changes

## 2024-01-13 LAB — URINE CULTURE

## 2024-01-22 DIAGNOSIS — Z993 Dependence on wheelchair: Secondary | ICD-10-CM

## 2024-01-22 DIAGNOSIS — R252 Cramp and spasm: Secondary | ICD-10-CM

## 2024-01-22 DIAGNOSIS — Z8673 Personal history of transient ischemic attack (TIA), and cerebral infarction without residual deficits: Secondary | ICD-10-CM

## 2024-01-22 DIAGNOSIS — R29898 Other symptoms and signs involving the musculoskeletal system: Secondary | ICD-10-CM

## 2024-01-24 ENCOUNTER — Ambulatory Visit: Admitting: Medical

## 2024-01-24 MED ORDER — ENSURE MAX PROTEIN PO LIQD: ORAL | 2 refills | Status: AC

## 2024-01-31 ENCOUNTER — Other Ambulatory Visit: Payer: Self-pay | Admitting: Cardiology

## 2024-01-31 MED ORDER — HOME STYLE BED RAILS MISC: 0 refills | Status: AC

## 2024-02-01 ENCOUNTER — Other Ambulatory Visit: Payer: Self-pay | Admitting: Medical

## 2024-02-03 ENCOUNTER — Other Ambulatory Visit: Payer: Self-pay | Admitting: Medical

## 2024-02-04 ENCOUNTER — Other Ambulatory Visit: Payer: Self-pay | Admitting: Internal Medicine

## 2024-02-04 MED ORDER — PAROXETINE HCL 20 MG PO TABS: 20.0000 mg | ORAL_TABLET | Freq: Every day | ORAL | 2 refills | Status: AC

## 2024-02-09 ENCOUNTER — Other Ambulatory Visit

## 2024-02-12 ENCOUNTER — Ambulatory Visit: Admitting: Medical

## 2024-07-10 ENCOUNTER — Ambulatory Visit: Admitting: Adult Health

## 2024-11-05 ENCOUNTER — Ambulatory Visit: Payer: Self-pay
